# Patient Record
Sex: Male | Born: 1970 | Race: White | Hispanic: No | Marital: Single | Smoking: Never smoker
Health system: Southern US, Community
[De-identification: ages and names within clinical notes are randomized; demographics above are authoritative.]

## PROBLEM LIST (undated history)

## (undated) DIAGNOSIS — Q909 Down syndrome, unspecified: Secondary | ICD-10-CM

## (undated) DIAGNOSIS — Q249 Congenital malformation of heart, unspecified: Secondary | ICD-10-CM

## (undated) DIAGNOSIS — E785 Hyperlipidemia, unspecified: Secondary | ICD-10-CM

## (undated) DIAGNOSIS — H269 Unspecified cataract: Secondary | ICD-10-CM

## (undated) DIAGNOSIS — G40909 Epilepsy, unspecified, not intractable, without status epilepticus: Secondary | ICD-10-CM

## (undated) HISTORY — DX: Hyperlipidemia, unspecified: E78.5

## (undated) HISTORY — DX: Epilepsy, unspecified, not intractable, without status epilepticus: G40.909

## (undated) HISTORY — DX: Down syndrome, unspecified: Q90.9

## (undated) HISTORY — DX: Unspecified cataract: H26.9

---

## 1998-11-26 ENCOUNTER — Encounter: Payer: Self-pay | Admitting: Emergency Medicine

## 1998-11-26 ENCOUNTER — Emergency Department (HOSPITAL_COMMUNITY): Admission: EM | Admit: 1998-11-26 | Discharge: 1998-11-26 | Payer: Self-pay | Admitting: Emergency Medicine

## 2000-03-30 ENCOUNTER — Encounter: Payer: Self-pay | Admitting: Emergency Medicine

## 2000-03-30 ENCOUNTER — Observation Stay (HOSPITAL_COMMUNITY): Admission: EM | Admit: 2000-03-30 | Discharge: 2000-03-31 | Payer: Self-pay | Admitting: Emergency Medicine

## 2001-03-25 ENCOUNTER — Encounter: Payer: Self-pay | Admitting: Emergency Medicine

## 2001-03-25 ENCOUNTER — Emergency Department (HOSPITAL_COMMUNITY): Admission: EM | Admit: 2001-03-25 | Discharge: 2001-03-25 | Payer: Self-pay | Admitting: Emergency Medicine

## 2002-02-20 ENCOUNTER — Emergency Department (HOSPITAL_COMMUNITY): Admission: EM | Admit: 2002-02-20 | Discharge: 2002-02-20 | Payer: Self-pay | Admitting: Emergency Medicine

## 2003-08-18 ENCOUNTER — Emergency Department (HOSPITAL_COMMUNITY): Admission: EM | Admit: 2003-08-18 | Discharge: 2003-08-18 | Payer: Self-pay | Admitting: Emergency Medicine

## 2005-05-18 ENCOUNTER — Ambulatory Visit: Payer: Self-pay | Admitting: Family Medicine

## 2005-06-25 ENCOUNTER — Emergency Department (HOSPITAL_COMMUNITY): Admission: EM | Admit: 2005-06-25 | Discharge: 2005-06-25 | Payer: Self-pay | Admitting: Emergency Medicine

## 2005-06-26 ENCOUNTER — Ambulatory Visit: Payer: Self-pay | Admitting: Family Medicine

## 2005-07-04 ENCOUNTER — Emergency Department (HOSPITAL_COMMUNITY): Admission: EM | Admit: 2005-07-04 | Discharge: 2005-07-04 | Payer: Self-pay | Admitting: Emergency Medicine

## 2005-09-21 ENCOUNTER — Ambulatory Visit: Payer: Self-pay | Admitting: Family Medicine

## 2005-11-14 ENCOUNTER — Ambulatory Visit: Payer: Self-pay | Admitting: Family Medicine

## 2006-08-16 ENCOUNTER — Ambulatory Visit: Payer: Self-pay | Admitting: Family Medicine

## 2006-08-16 ENCOUNTER — Encounter (INDEPENDENT_AMBULATORY_CARE_PROVIDER_SITE_OTHER): Payer: Self-pay | Admitting: Internal Medicine

## 2006-08-16 LAB — CONVERTED CEMR LAB
ALT: 15 units/L (ref 0–40)
AST: 15 units/L (ref 0–37)
Cholesterol: 165 mg/dL (ref 0–200)
HDL: 44.8 mg/dL (ref >39.0)
LDL Cholesterol: 101 mg/dL — ABNORMAL HIGH (ref 0–99)
Phenobarbital: 36.4 ug/mL (ref 15.0–40.0)
Total CHOL/HDL Ratio: 3.7
Triglycerides: 95 mg/dL (ref 0–149)
VLDL: 19 mg/dL (ref 0–40)

## 2007-01-31 ENCOUNTER — Telehealth (INDEPENDENT_AMBULATORY_CARE_PROVIDER_SITE_OTHER): Payer: Self-pay | Admitting: Internal Medicine

## 2007-02-07 ENCOUNTER — Ambulatory Visit: Payer: Self-pay | Admitting: Internal Medicine

## 2007-02-07 DIAGNOSIS — R002 Palpitations: Secondary | ICD-10-CM

## 2007-02-07 DIAGNOSIS — R011 Cardiac murmur, unspecified: Secondary | ICD-10-CM

## 2007-02-07 DIAGNOSIS — Q909 Down syndrome, unspecified: Secondary | ICD-10-CM

## 2007-02-07 DIAGNOSIS — E785 Hyperlipidemia, unspecified: Secondary | ICD-10-CM

## 2007-02-07 DIAGNOSIS — R569 Unspecified convulsions: Secondary | ICD-10-CM

## 2007-02-07 DIAGNOSIS — L702 Acne varioliformis: Secondary | ICD-10-CM

## 2007-02-08 LAB — CONVERTED CEMR LAB
ALT: 22 units/L (ref 0–53)
AST: 16 units/L (ref 0–37)
BUN: 14 mg/dL (ref 6–23)
Basophils Absolute: 0 10*3/uL (ref 0.0–0.1)
Basophils Relative: 0.7 % (ref 0.0–1.0)
CO2: 34 meq/L — ABNORMAL HIGH (ref 19–32)
Calcium: 9.1 mg/dL (ref 8.4–10.5)
Chloride: 102 meq/L (ref 96–112)
Cholesterol: 165 mg/dL (ref 0–200)
Creatinine, Ser: 1.1 mg/dL (ref 0.4–1.5)
Eosinophils Absolute: 0 10*3/uL (ref 0.0–0.6)
Eosinophils Relative: 0.6 % (ref 0.0–5.0)
GFR calc Af Amer: 98 mL/min
GFR calc non Af Amer: 81 mL/min
Glucose, Bld: 98 mg/dL (ref 70–99)
HCT: 45.1 % (ref 39.0–52.0)
HDL: 51.6 mg/dL (ref >39.0)
Hemoglobin: 15.4 g/dL (ref 13.0–17.0)
LDL Cholesterol: 98 mg/dL (ref 0–99)
Lymphocytes Relative: 36.9 % (ref 12.0–46.0)
MCHC: 34.1 g/dL (ref 30.0–36.0)
MCV: 102.4 fL — ABNORMAL HIGH (ref 78.0–100.0)
Monocytes Absolute: 0.5 10*3/uL (ref 0.2–0.7)
Monocytes Relative: 9.4 % (ref 3.0–11.0)
Neutro Abs: 2.5 10*3/uL (ref 1.4–7.7)
Neutrophils Relative %: 52.4 % (ref 43.0–77.0)
Platelets: 266 10*3/uL (ref 150–400)
Potassium: 4.5 meq/L (ref 3.5–5.1)
RBC: 4.41 M/uL (ref 4.22–5.81)
RDW: 14.1 % (ref 11.5–14.6)
Sodium: 140 meq/L (ref 135–145)
TSH: 0.92 microintl units/mL (ref 0.35–5.50)
Total CHOL/HDL Ratio: 3.2
Triglycerides: 75 mg/dL (ref 0–149)
VLDL: 15 mg/dL (ref 0–40)
WBC: 4.8 10*3/uL (ref 4.5–10.5)

## 2007-07-02 ENCOUNTER — Telehealth (INDEPENDENT_AMBULATORY_CARE_PROVIDER_SITE_OTHER): Payer: Self-pay | Admitting: Internal Medicine

## 2007-08-13 ENCOUNTER — Ambulatory Visit: Payer: Self-pay | Admitting: Internal Medicine

## 2007-08-13 ENCOUNTER — Telehealth (INDEPENDENT_AMBULATORY_CARE_PROVIDER_SITE_OTHER): Payer: Self-pay | Admitting: *Deleted

## 2007-08-15 LAB — CONVERTED CEMR LAB
ALT: 19 units/L (ref 0–53)
AST: 15 units/L (ref 0–37)
Cholesterol: 176 mg/dL (ref 0–200)
HDL: 45 mg/dL (ref >39.0)
LDL Cholesterol: 118 mg/dL — ABNORMAL HIGH (ref 0–99)
Total CHOL/HDL Ratio: 3.9
Triglycerides: 65 mg/dL (ref 0–149)
VLDL: 13 mg/dL (ref 0–40)

## 2007-09-02 ENCOUNTER — Telehealth (INDEPENDENT_AMBULATORY_CARE_PROVIDER_SITE_OTHER): Payer: Self-pay | Admitting: Internal Medicine

## 2007-09-11 ENCOUNTER — Encounter (INDEPENDENT_AMBULATORY_CARE_PROVIDER_SITE_OTHER): Payer: Self-pay | Admitting: Internal Medicine

## 2007-09-11 ENCOUNTER — Ambulatory Visit: Payer: Self-pay | Admitting: Family Medicine

## 2007-09-13 ENCOUNTER — Telehealth (INDEPENDENT_AMBULATORY_CARE_PROVIDER_SITE_OTHER): Payer: Self-pay | Admitting: Internal Medicine

## 2007-09-13 LAB — CONVERTED CEMR LAB: Phenobarbital: 32.2 ug/mL (ref 15.0–40.0)

## 2008-04-08 DIAGNOSIS — H6690 Otitis media, unspecified, unspecified ear: Secondary | ICD-10-CM | POA: Insufficient documentation

## 2008-04-14 ENCOUNTER — Encounter (INDEPENDENT_AMBULATORY_CARE_PROVIDER_SITE_OTHER): Payer: Self-pay | Admitting: Internal Medicine

## 2008-04-14 ENCOUNTER — Ambulatory Visit: Payer: Self-pay | Admitting: Family Medicine

## 2008-04-15 ENCOUNTER — Encounter (INDEPENDENT_AMBULATORY_CARE_PROVIDER_SITE_OTHER): Payer: Self-pay | Admitting: Internal Medicine

## 2008-04-15 LAB — CONVERTED CEMR LAB
ALT: 17 units/L (ref 0–53)
AST: 14 units/L (ref 0–37)
Cholesterol: 155 mg/dL (ref 0–200)
HDL: 54.9 mg/dL (ref >39.0)
LDL Cholesterol: 83 mg/dL (ref 0–99)
Phenobarbital: 36 ug/mL (ref 15.0–40.0)
Total CHOL/HDL Ratio: 2.8
Triglycerides: 87 mg/dL (ref 0–149)
VLDL: 17 mg/dL (ref 0–40)

## 2008-10-21 ENCOUNTER — Telehealth (INDEPENDENT_AMBULATORY_CARE_PROVIDER_SITE_OTHER): Payer: Self-pay | Admitting: Internal Medicine

## 2009-04-19 ENCOUNTER — Telehealth: Payer: Self-pay | Admitting: Family Medicine

## 2009-04-28 ENCOUNTER — Ambulatory Visit: Payer: Self-pay | Admitting: Family Medicine

## 2009-04-28 ENCOUNTER — Encounter (INDEPENDENT_AMBULATORY_CARE_PROVIDER_SITE_OTHER): Payer: Self-pay | Admitting: Internal Medicine

## 2009-04-29 LAB — CONVERTED CEMR LAB
ALT: 15 units/L (ref 0–53)
AST: 16 units/L (ref 0–37)
BUN: 12 mg/dL (ref 6–23)
Basophils Absolute: 0 10*3/uL (ref 0.0–0.1)
Basophils Relative: 0.7 % (ref 0.0–3.0)
CO2: 31 meq/L (ref 19–32)
Calcium: 9.1 mg/dL (ref 8.4–10.5)
Chloride: 105 meq/L (ref 96–112)
Cholesterol: 176 mg/dL (ref 0–200)
Creatinine, Ser: 1.1 mg/dL (ref 0.4–1.5)
Eosinophils Absolute: 0 10*3/uL (ref 0.0–0.7)
Eosinophils Relative: 0.7 % (ref 0.0–5.0)
GFR calc non Af Amer: 79.68 mL/min (ref >60)
Glucose, Bld: 99 mg/dL (ref 70–99)
HCT: 44.1 % (ref 39.0–52.0)
HDL: 45.9 mg/dL (ref >39.00)
Hemoglobin: 14.9 g/dL (ref 13.0–17.0)
LDL Cholesterol: 108 mg/dL — ABNORMAL HIGH (ref 0–99)
Lymphocytes Relative: 27.5 % (ref 12.0–46.0)
Lymphs Abs: 1.1 10*3/uL (ref 0.7–4.0)
MCHC: 33.8 g/dL (ref 30.0–36.0)
MCV: 104.2 fL — ABNORMAL HIGH (ref 78.0–100.0)
Monocytes Absolute: 0.5 10*3/uL (ref 0.1–1.0)
Monocytes Relative: 11.7 % (ref 3.0–12.0)
Neutro Abs: 2.3 10*3/uL (ref 1.4–7.7)
Neutrophils Relative %: 59.4 % (ref 43.0–77.0)
Phenobarbital: 31.9 ug/mL (ref 15.0–40.0)
Platelets: 233 10*3/uL (ref 150.0–400.0)
Potassium: 4.9 meq/L (ref 3.5–5.1)
RBC: 4.24 M/uL (ref 4.22–5.81)
RDW: 13.8 % (ref 11.5–14.6)
Sodium: 141 meq/L (ref 135–145)
TSH: 2.24 microintl units/mL (ref 0.35–5.50)
Total CHOL/HDL Ratio: 4
Triglycerides: 113 mg/dL (ref 0.0–149.0)
VLDL: 22.6 mg/dL (ref 0.0–40.0)
WBC: 3.9 10*3/uL — ABNORMAL LOW (ref 4.5–10.5)

## 2009-07-08 ENCOUNTER — Telehealth (INDEPENDENT_AMBULATORY_CARE_PROVIDER_SITE_OTHER): Payer: Self-pay | Admitting: Internal Medicine

## 2009-10-05 ENCOUNTER — Telehealth: Payer: Self-pay | Admitting: Family Medicine

## 2009-11-03 ENCOUNTER — Ambulatory Visit: Payer: Self-pay | Admitting: Family Medicine

## 2009-11-04 LAB — CONVERTED CEMR LAB
Cholesterol: 163 mg/dL (ref 0–200)
HDL: 60 mg/dL (ref >39.00)
LDL Cholesterol: 89 mg/dL (ref 0–99)
Phenobarbital: 31.5 ug/mL (ref 15.0–40.0)
Total CHOL/HDL Ratio: 3
Triglycerides: 72 mg/dL (ref 0.0–149.0)
VLDL: 14.4 mg/dL (ref 0.0–40.0)

## 2010-04-18 ENCOUNTER — Encounter: Payer: Self-pay | Admitting: Family Medicine

## 2010-04-18 ENCOUNTER — Ambulatory Visit: Payer: Self-pay | Admitting: Internal Medicine

## 2010-04-18 DIAGNOSIS — M25559 Pain in unspecified hip: Secondary | ICD-10-CM

## 2010-04-18 LAB — CONVERTED CEMR LAB: Direct LDL: 106.6 mg/dL

## 2010-04-19 ENCOUNTER — Telehealth (INDEPENDENT_AMBULATORY_CARE_PROVIDER_SITE_OTHER): Payer: Self-pay | Admitting: *Deleted

## 2010-04-19 LAB — CONVERTED CEMR LAB: Phenobarbital: 38.8 ug/mL (ref 15.0–40.0)

## 2010-04-20 LAB — CONVERTED CEMR LAB
ALT: 15 units/L (ref 0–53)
AST: 16 units/L (ref 0–37)
Albumin: 3.8 g/dL (ref 3.5–5.2)
Alkaline Phosphatase: 105 units/L (ref 39–117)
BUN: 14 mg/dL (ref 6–23)
Basophils Absolute: 0 10*3/uL (ref 0.0–0.1)
Basophils Relative: 0.9 % (ref 0.0–3.0)
Bilirubin, Direct: 0.1 mg/dL (ref 0.0–0.3)
CO2: 32 meq/L (ref 19–32)
Calcium: 9 mg/dL (ref 8.4–10.5)
Chloride: 102 meq/L (ref 96–112)
Cholesterol: 166 mg/dL (ref 0–200)
Creatinine, Ser: 1.3 mg/dL (ref 0.4–1.5)
Eosinophils Absolute: 0 10*3/uL (ref 0.0–0.7)
Eosinophils Relative: 0.8 % (ref 0.0–5.0)
Folate: 8.4 ng/mL
GFR calc non Af Amer: 65.37 mL/min (ref >60)
Glucose, Bld: 98 mg/dL (ref 70–99)
HCT: 44 % (ref 39.0–52.0)
HDL: 48.5 mg/dL (ref >39.00)
Hemoglobin: 14.9 g/dL (ref 13.0–17.0)
LDL Cholesterol: 97 mg/dL (ref 0–99)
Lymphocytes Relative: 33.8 % (ref 12.0–46.0)
Lymphs Abs: 1.5 10*3/uL (ref 0.7–4.0)
MCHC: 34 g/dL (ref 30.0–36.0)
MCV: 106.2 fL — ABNORMAL HIGH (ref 78.0–100.0)
Monocytes Absolute: 0.5 10*3/uL (ref 0.1–1.0)
Monocytes Relative: 11.2 % (ref 3.0–12.0)
Neutro Abs: 2.4 10*3/uL (ref 1.4–7.7)
Neutrophils Relative %: 53.3 % (ref 43.0–77.0)
Platelets: 242 10*3/uL (ref 150.0–400.0)
Potassium: 4.7 meq/L (ref 3.5–5.1)
RBC: 4.14 M/uL — ABNORMAL LOW (ref 4.22–5.81)
RDW: 14.3 % (ref 11.5–14.6)
Sodium: 142 meq/L (ref 135–145)
TSH: 2.5 microintl units/mL (ref 0.35–5.50)
Total Bilirubin: 0.4 mg/dL (ref 0.3–1.2)
Total CHOL/HDL Ratio: 3
Total CK: 51 units/L (ref 7–232)
Total Protein: 6.8 g/dL (ref 6.0–8.3)
Triglycerides: 102 mg/dL (ref 0.0–149.0)
VLDL: 20.4 mg/dL (ref 0.0–40.0)
Vitamin B-12: 146 pg/mL — ABNORMAL LOW (ref 211–911)
WBC: 4.4 10*3/uL — ABNORMAL LOW (ref 4.5–10.5)

## 2010-04-22 ENCOUNTER — Ambulatory Visit: Payer: Self-pay | Admitting: Internal Medicine

## 2010-04-22 DIAGNOSIS — E538 Deficiency of other specified B group vitamins: Secondary | ICD-10-CM

## 2010-04-29 ENCOUNTER — Ambulatory Visit: Payer: Self-pay | Admitting: Internal Medicine

## 2010-05-04 ENCOUNTER — Encounter: Payer: Self-pay | Admitting: Family Medicine

## 2010-05-06 ENCOUNTER — Ambulatory Visit: Payer: Self-pay | Admitting: Family Medicine

## 2010-05-06 ENCOUNTER — Ambulatory Visit: Payer: Self-pay | Admitting: Internal Medicine

## 2010-05-13 ENCOUNTER — Ambulatory Visit: Payer: Self-pay | Admitting: Family Medicine

## 2010-06-07 ENCOUNTER — Telehealth: Payer: Self-pay | Admitting: Family Medicine

## 2010-08-30 NOTE — Letter (Signed)
Summary: Physical Exam form/Community Support Service  Physical Exam form/Community Support Service   Imported By: Sherian Rein 05/12/2010 08:18:10  _____________________________________________________________________  External Attachment:    Type:   Image     Comment:   External Document

## 2010-08-30 NOTE — Progress Notes (Signed)
Summary: ? B-12  Phone Note Call from Patient Call back at [phone]   Caller: Sister/Kim Call For: Kenneth Boyden  MD Summary of Call: Patient was told to start taking Vitamin B-12. Kim went to the pharmacy to pickup it up  and she needs to know what dose he needs to take. Pharmacist told her that this comes in difference strength.  Initial call taken by: Sydell Axon LPN,  June 07, 2010 4:26 PM  Follow-up for Phone Call        1000 micrograms oral daily.  thanks Follow-up by: Kenneth Boyden  MD,  June 07, 2010 5:00 PM  Additional Follow-up for Phone Call Additional follow up Details #1::        Spoke with patient's brother in law and gave him the correct strength.  Additional Follow-up by: Janee Morn CMA Duncan Dull),  June 08, 2010 8:48 AM

## 2010-08-30 NOTE — Progress Notes (Signed)
----   Converted from flag ---- ---- 04/19/2010 9:52 AM, Mills Koller wrote:   ---- 04/19/2010 8:50 AM, Eustaquio Boyden  MD wrote: can we add TSH, B12 and folate to blood drawn yesterday? ------------------------------

## 2010-08-30 NOTE — Assessment & Plan Note (Signed)
Summary: B-12//kad  Nurse Visit   Allergies: 1)  ! Pcn 2)  Augmentin  Medication Administration  Injection # 1:    Medication: Vit B12 1000 mcg    Diagnosis: VITAMIN B12 DEFICIENCY (ICD-266.2)    Route: IM    Site: L deltoid    Exp Date: 01/28/2012    Lot #: [id number]    Mfr: American Regent    Comments: Per Dr. Sharen Hones    Patient tolerated injection without complications    Given by: Selena Batten Dance CMA Duncan Dull) (May 06, 2010 4:03 PM)  Orders Added: 1)  Admin of Therapeutic Inj  intramuscular or subcutaneous [16109]

## 2010-08-30 NOTE — Assessment & Plan Note (Signed)
Summary: REFILL MEDS/BILLIE'S PT/CLE   Vital Signs:  Patient profile:   40 year old male Weight:      170.75 pounds Temp:     97.9 degrees F oral Pulse rate:   60 / minute Pulse rhythm:   regular BP sitting:   98 / 60  (left arm) Cuff size:   large  Vitals Entered By: Selena Batten Dance CMA Duncan Dull) (April 18, 2010 8:31 AM) CC: Refill meds   History of Present Illness: CC: CPE and med refills.  Here for follow up of Down's, seizure disorder, elevated cholesterol, and yearly physical for DSS --no seizures in past 3 years. 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  Is complaining of hips and knee hurting, esp when gets up or out of car "doesn't work well".  Has fallen twice.  Here with sister who is his "worker"  and guardian.  Current Medications (verified): 1)  Phenobarbital 64.8 Mg  Tabs (Phenobarbital) .... 4 Qhs 2)  Lovastatin 20 Mg Tabs (Lovastatin) .Marland Kitchen.. 1 By Mouth Daily 3)  Gabapentin 100 Mg  Caps (Gabapentin) .... 2 Tabs Two Times A Day 4)  Seroquel 400 Mg Tabs (Quetiapine Fumarate) .... Take One By Mouth Daily  Allergies: 1)  ! Pcn 2)  Augmentin  Past History:  Past Medical History: Down's Syndrome Seizure d/o HLD  Past Surgical History: none  Family History: Parents: DM, HTN,  F: CAD/MI, CVA M: cerebral hemorrhage MGM: oral cancer MAunt: colon cancer (70s)  No other CA  Social History: No smoking, EtOH  Marital Status: single, lives with his sister and her family, 5 dogs, 1 cat, 4 goats, 20 chickens Children: none Occupation: working at [employer]"  Review of Systems  The patient denies anorexia, fever, weight loss, weight gain, vision loss, decreased hearing, hoarseness, chest pain, syncope, dyspnea on exertion, peripheral edema, prolonged cough, headaches, hemoptysis, abdominal pain, melena, hematochezia, severe indigestion/heartburn,  hematuria, and incontinence.    Physical Exam  General:  alert, well-developed, well-nourished, and well-hydrated. Head:  Normocephalic and atraumatic without obvious abnormalities. No apparent alopecia or balding. Eyes:  pupils equal, pupils round, and no injection.  glasses Ears:  R ear normal and L ear normal.   Mouth:  pharynx pink and moist and edentulous.  tongue large for mouth Neck:  no thyromegaly, no JVD, and no carotid bruits.   Lungs:  normal respiratory effort, no intercostal retractions, no accessory muscle use, and normal breath sounds.   Heart:  normal rate, regular rhythm, and no murmur.   Abdomen:  soft, non-tender, normal bowel sounds, no distention, no masses, no guarding, no inguinal hernia, no hepatomegaly, and no splenomegaly.   has ventral hernia, large--visible only with increased intrabdominal pressure Msk:  no joint swelling, no joint warmth, no redness over joints, and no joint deformities.  + mild tenderness to int/ext rotation at bilateral hips, endorses pain with palpation of greater trochanteric bursas B and midline spine. Extremities:  no edema either lower legs Neurologic:  alert & oriented X3, sensation intact to light touch, gait normal Skin:  turgor normal, color normal, and no rashes.  acne scarring back of neck Psych:  normally interactive, flat affect, and subdued.     Impression & Recommendations:  Problem # 1:  SEIZURE DISORDER, GENERALIZED (ICD-780.39) check phenobarb level, CBC, LFTs.  Stable on current regimen. His updated medication list for this problem includes:    Phenobarbital 64.8 Mg Tabs (  Phenobarbital) .Marland KitchenMarland KitchenMarland KitchenMarland Kitchen 4 qhs    Gabapentin 100 Mg Caps (Gabapentin) .Marland Kitchen... 2 tabs two times a day  Orders: TLB-BMP (Basic Metabolic Panel-BMET) (80048-METABOL) TLB-CBC Platelet - w/Differential (85025-CBCD) TLB-Hepatic/Liver Function Pnl (80076-HEPATIC) T-Phenobarbital (84696-29528) Specimen Handling (41324) Prescription Created Electronically  [phone])  Labs Reviewed: Hgb: 14.9 (04/28/2009)   Hct: 44.1 (04/28/2009)   WBC: 3.9 (04/28/2009)   RBC: 4.24 (04/28/2009)   Plts: 233.0 (04/28/2009) SGOT: 16 (04/28/2009)   SGPT: 15 (04/28/2009)    Pheonbarbital: 31.5 (11/03/2009)     Problem # 2:  HYPERCHOLESTEROLEMIA, PURE (ICD-272.0) check dLDL (last checked FLP 10/2009.) His updated medication list for this problem includes:    Lovastatin 20 Mg Tabs (Lovastatin) .Marland Kitchen... 1 by mouth daily  Orders: TLB-Lipid Panel (80061-LIPID)  Labs Reviewed: SGOT: 16 (04/28/2009)   SGPT: 15 (04/28/2009)   HDL:60.00 (11/03/2009), 45.90 (04/28/2009)  LDL:89 (11/03/2009), 108 (72/53/6644)  Chol:163 (11/03/2009), 176 (04/28/2009)  Trig:72.0 (11/03/2009), 113.0 (04/28/2009)  Problem # 3:  PAIN IN JOINT PELVIC REGION AND THIGH (ICD-719.45) ? arthritis vs other.  if continued despite NSAID, consider xray to r/o hip pathology. His updated medication list for this problem includes:    Ec-naprosyn 500 Mg Tbec (Naproxen) .Marland Kitchen... Take one by mouth two times a day x 7 days then as needed.  take with food  Orders: TLB-CK Total Only(Creatine Kinase/CPK) (82550-CK)  Problem # 4:  HEALTH MAINTENANCE EXAM (ICD-V70.0) Reviewed preventive care protocols, scheduled due services, and updated immunizations.  flu shot today.  Problem # 5:  DOWN SYNDROME (ICD-758.0) takes seroquel to help calm (previously was wandering off).  stable  Complete Medication List: 1)  Phenobarbital 64.8 Mg Tabs (Phenobarbital) .... 4 qhs 2)  Lovastatin 20 Mg Tabs (Lovastatin) .Marland Kitchen.. 1 by mouth daily 3)  Gabapentin 100 Mg Caps (Gabapentin) .... 2 tabs two times a day 4)  Seroquel 400 Mg Tabs (Quetiapine fumarate) .... Take one by mouth daily 5)  Ec-naprosyn 500 Mg Tbec (Naproxen) .... Take one by mouth two times a day x 7 days then as needed.  take with food  Other Orders: Flu Vaccine 29yrs + (03474) Admin 1st Vaccine (25956)  Patient Instructions: 1)  Joint pains could be some arthritis.   Try naprosyn 500mg  twice daily for 1 week then as needed. 2)  Pleasure to meet you today, paperwork filled out. 3)  Flu shot today.  blood work today. Prescriptions: PHENOBARBITAL 64.8 MG  TABS (PHENOBARBITAL) 4 qhs  #360 x 6   Entered and Authorized by:   Eustaquio Boyden  MD   Signed by:   Eustaquio Boyden  MD on 04/18/2010   Method used:   Telephoned to ...       [facility]       [address]       [place], [place]  [address]       Ph: [phone]       Fax: [phone]   RxID:   [id number] LOVASTATIN 20 MG TABS (LOVASTATIN) 1 by mouth daily  #90 x 3   Entered and Authorized by:   Eustaquio Boyden  MD   Signed by:   Eustaquio Boyden  MD on 04/18/2010   Method used:   Electronically to        [facility] (retail)       [address]       [place], [place]  [address]  Ph: [phone]       Fax: [phone]   RxID:   [id number] EC-NAPROSYN 500 MG TBEC (NAPROXEN) take one by mouth two times a day x 7 days then as needed.  take with food  #30 x 0   Entered and Authorized by:   Eustaquio Boyden  MD   Signed by:   Eustaquio Boyden  MD on 04/18/2010   Method used:   Electronically to        [facility] (retail)       [address]       [place], [place]  [address]       Ph: [phone]       Fax: [phone]   RxID:   [id number]   Current Allergies (reviewed today): ! PCN AUGMENTIN   Prevention & Chronic Care Immunizations   Influenza vaccine: Fluvax 3+  (04/18/2010)   Influenza vaccine due: 04/01/2011    Tetanus booster: 04/28/2009: Tdap   Tetanus booster due: 04/29/2019    Pneumococcal vaccine: Not documented  Other Screening   Smoking status: never  (02/07/2007)  Lipids   Total Cholesterol: 163  (11/03/2009)   LDL: 89  (11/03/2009)   LDL Direct: Not documented   HDL: 60.00  (11/03/2009)   Triglycerides: 72.0  (11/03/2009)    SGOT  (AST): 16  (04/28/2009)   SGPT (ALT): 15  (04/28/2009)   Alkaline phosphatase: Not documented   Total bilirubin: Not documented  Self-Management Support :    Lipid self-management support: Not documented     Immunizations Administered:  Influenza Vaccine # 1:    Vaccine Type: Fluvax 3+    Site: left deltoid    Mfr: GlaxoSmithKline    Dose: 0.5 ml    Route: IM    Given by: Selena Batten Dance CMA (AAMA)    Exp. Date: 01/28/2011    Lot #: [id number]    VIS given: 02/21/07 version given April 18, 2010.  Flu Vaccine Consent Questions:    Do you have a history of severe allergic reactions to this vaccine? no    Any prior history of allergic reactions to egg and/or gelatin? no    Do you have a sensitivity to the preservative Thimersol? no    Do you have a past history of Guillan-Barre Syndrome? no    Do you currently have an acute febrile illness? no    Have you ever had a severe reaction to latex? no    Vaccine information given and explained to patient? yes    Appended Document: REFILL MEDS/BILLIE'S PT/CLE

## 2010-08-30 NOTE — Assessment & Plan Note (Signed)
Summary: B-12//kad  Nurse Visit   Allergies: 1)  ! Pcn 2)  Augmentin  Medication Administration  Injection # 1:    Medication: Vit B12 1000 mcg    Diagnosis: VITAMIN B12 DEFICIENCY (ICD-266.2)    Route: IM    Site: R deltoid    Exp Date: 10/30/2011    Lot #: [id number]    Mfr: American Regent    Patient tolerated injection without complications    Given by: Mervin Hack CMA Duncan Dull) (April 29, 2010 3:23 PM)  Orders Added: 1)  Vit B12 1000 mcg [J3420] 2)  Admin of Therapeutic Inj  intramuscular or subcutaneous [44010]

## 2010-08-30 NOTE — Assessment & Plan Note (Signed)
Summary: B-12//kad  Nurse Visit   Allergies: 1)  ! Pcn 2)  Augmentin  Medication Administration  Injection # 1:    Medication: Vit B12 1000 mcg    Diagnosis: VITAMIN B12 DEFICIENCY (ICD-266.2)    Route: IM    Site: R deltoid    Exp Date: 12/30/2011    Lot #: [id number]    Mfr: American Regent    Patient tolerated injection without complications    Given by: Linde Gillis CMA Duncan Dull) (May 13, 2010 3:40 PM)  Orders Added: 1)  Vit B12 1000 mcg [J3420] 2)  Admin of Therapeutic Inj  intramuscular or subcutaneous [56213]

## 2010-08-30 NOTE — Progress Notes (Signed)
Summary: Rx Phenobarb  Phone Note Refill Request Call back at [phone] Message from:  [facility] on October 05, 2009 1:50 PM  Refills Requested: Medication #1:  PHENOBARBITAL 64.8 MG  TABS 4 qhs   Last Refilled: 09/06/2009 Received e-scribe refill request   Method Requested: Electronic Initial call taken by: Sydell Axon LPN,  October 05, 2009 1:50 PM    Prescriptions: PHENOBARBITAL 64.8 MG  TABS (PHENOBARBITAL) 4 qhs  #360 x 6   Entered and Authorized by:   Shaune Leeks MD   Signed by:   Shaune Leeks MD on 10/05/2009   Method used:   Telephoned to ...       [facility]       [address]       [place], [place]  [address]       Ph: [phone]       Fax: [phone]   RxID:   [id number]

## 2010-08-30 NOTE — Assessment & Plan Note (Signed)
Summary: B-12//kad  Nurse Visit   Allergies: 1)  ! Pcn 2)  Augmentin  Medication Administration  Injection # 1:    Medication: Vit B12 1000 mcg    Diagnosis: VITAMIN B12 DEFICIENCY (ICD-266.2)    Route: IM    Site: L deltoid    Exp Date: 07/31/2011    Lot #: [id number]    Mfr: American Regent    Comments: Per Dr. Sharen Hones    Patient tolerated injection without complications    Given by: Selena Batten Dance CMA Duncan Dull) (April 22, 2010 3:14 PM)  Orders Added: 1)  Vit B12 1000 mcg [J3420] 2)  Admin of Therapeutic Inj  intramuscular or subcutaneous [96372]  Appended Document: B-12//kad    Clinical Lists Changes  Orders: Added new Service order of Est. Patient Level I (56213) - Signed

## 2010-08-31 ENCOUNTER — Telehealth: Payer: Self-pay | Admitting: Family Medicine

## 2010-09-07 NOTE — Progress Notes (Signed)
Summary: rpt labs  Phone Note Outgoing Call   Summary of Call: can we call pt and set up with rpt labs in next month - CBC, B12 [266.2] Initial call taken by: Eustaquio Boyden  MD,  August 31, 2010 8:28 AM  Follow-up for Phone Call        Message left for sister to return my call and schedule lab. Follow-up by: Janee Morn CMA Duncan Dull),  August 31, 2010 1:03 PM  Additional Follow-up for Phone Call Additional follow up Details #1::        Message left for sister to return my call. Kim Dance CMA (AAMA)  September 02, 2010 1:08 PM   Spoke with sister and labs scheduled.  Additional Follow-up by: Janee Morn CMA Duncan Dull),  September 02, 2010 2:17 PM

## 2010-10-03 ENCOUNTER — Encounter (INDEPENDENT_AMBULATORY_CARE_PROVIDER_SITE_OTHER): Payer: Self-pay | Admitting: *Deleted

## 2010-10-03 ENCOUNTER — Other Ambulatory Visit: Payer: Self-pay | Admitting: Family Medicine

## 2010-10-03 ENCOUNTER — Other Ambulatory Visit (INDEPENDENT_AMBULATORY_CARE_PROVIDER_SITE_OTHER): Payer: Medicare Other

## 2010-10-03 DIAGNOSIS — E538 Deficiency of other specified B group vitamins: Secondary | ICD-10-CM

## 2010-10-03 LAB — CBC WITH DIFFERENTIAL/PLATELET
Basophils Absolute: 0 10*3/uL (ref 0.0–0.1)
Basophils Relative: 0.8 % (ref 0.0–3.0)
Eosinophils Absolute: 0 10*3/uL (ref 0.0–0.7)
Eosinophils Relative: 0.6 % (ref 0.0–5.0)
HCT: 41.7 % (ref 39.0–52.0)
Hemoglobin: 14.4 g/dL (ref 13.0–17.0)
Lymphocytes Relative: 30.1 % (ref 12.0–46.0)
Lymphs Abs: 1.5 10*3/uL (ref 0.7–4.0)
MCHC: 34.5 g/dL (ref 30.0–36.0)
MCV: 104.1 fl — ABNORMAL HIGH (ref 78.0–100.0)
Monocytes Absolute: 0.5 10*3/uL (ref 0.1–1.0)
Monocytes Relative: 10 % (ref 3.0–12.0)
Neutro Abs: 3 10*3/uL (ref 1.4–7.7)
Neutrophils Relative %: 58.5 % (ref 43.0–77.0)
RBC: 4 Mil/uL — ABNORMAL LOW (ref 4.22–5.81)

## 2010-10-11 ENCOUNTER — Encounter: Payer: Self-pay | Admitting: Family Medicine

## 2010-10-11 DIAGNOSIS — G40909 Epilepsy, unspecified, not intractable, without status epilepticus: Secondary | ICD-10-CM | POA: Insufficient documentation

## 2010-10-11 DIAGNOSIS — E785 Hyperlipidemia, unspecified: Secondary | ICD-10-CM

## 2010-10-11 DIAGNOSIS — Q909 Down syndrome, unspecified: Secondary | ICD-10-CM | POA: Insufficient documentation

## 2010-11-09 ENCOUNTER — Other Ambulatory Visit: Payer: Self-pay | Admitting: *Deleted

## 2010-11-09 MED ORDER — PHENOBARBITAL 64.8 MG PO TABS: 259.2000 mg | ORAL_TABLET | Freq: Every day | ORAL | Status: DC

## 2010-11-09 NOTE — Telephone Encounter (Signed)
Refilled.  Notify family.

## 2010-11-25 ENCOUNTER — Emergency Department (HOSPITAL_COMMUNITY)
Admission: EM | Admit: 2010-11-25 | Discharge: 2010-11-25 | Disposition: A | Discharge status: Home / Self Care | Payer: No Typology Code available for payment source | Attending: Emergency Medicine | Admitting: Emergency Medicine

## 2010-11-25 ENCOUNTER — Emergency Department (HOSPITAL_COMMUNITY): Payer: No Typology Code available for payment source

## 2010-11-25 DIAGNOSIS — R110 Nausea: Secondary | ICD-10-CM | POA: Insufficient documentation

## 2010-11-25 DIAGNOSIS — R1032 Left lower quadrant pain: Secondary | ICD-10-CM | POA: Insufficient documentation

## 2010-11-25 DIAGNOSIS — Q909 Down syndrome, unspecified: Secondary | ICD-10-CM | POA: Insufficient documentation

## 2010-11-25 DIAGNOSIS — G40909 Epilepsy, unspecified, not intractable, without status epilepticus: Secondary | ICD-10-CM | POA: Insufficient documentation

## 2010-11-25 DIAGNOSIS — Z79899 Other long term (current) drug therapy: Secondary | ICD-10-CM | POA: Insufficient documentation

## 2010-11-25 DIAGNOSIS — F79 Unspecified intellectual disabilities: Secondary | ICD-10-CM | POA: Insufficient documentation

## 2010-11-25 DIAGNOSIS — E7800 Pure hypercholesterolemia, unspecified: Secondary | ICD-10-CM | POA: Insufficient documentation

## 2010-11-25 LAB — CBC
HCT: 42.7 % (ref 39.0–52.0)
Hemoglobin: 14.9 g/dL (ref 13.0–17.0)
MCH: 34.8 pg — ABNORMAL HIGH (ref 26.0–34.0)
MCHC: 34.9 g/dL (ref 30.0–36.0)
MCV: 99.8 fL (ref 78.0–100.0)
Platelets: 275 10*3/uL (ref 150–400)
RDW: 14 % (ref 11.5–15.5)

## 2010-11-25 LAB — PHENOBARBITAL LEVEL: Phenobarbital: 32 ug/mL (ref 15.0–40.0)

## 2010-11-25 LAB — POCT I-STAT, CHEM 8
BUN: 12 mg/dL (ref 6–23)
Calcium, Ion: 1.15 mmol/L (ref 1.12–1.32)
Chloride: 103 mEq/L (ref 96–112)
Creatinine, Ser: 1.2 mg/dL (ref 0.4–1.5)
Glucose, Bld: 142 mg/dL — ABNORMAL HIGH (ref 70–99)
HCT: 44 % (ref 39.0–52.0)
Hemoglobin: 15 g/dL (ref 13.0–17.0)
Potassium: 4.2 mEq/L (ref 3.5–5.1)
TCO2: 29 mmol/L (ref 0–100)

## 2010-11-25 LAB — DIFFERENTIAL
Basophils Absolute: 0.1 10*3/uL (ref 0.0–0.1)
Basophils Relative: 2 % — ABNORMAL HIGH (ref 0–1)
Eosinophils Absolute: 0 10*3/uL (ref 0.0–0.7)
Lymphocytes Relative: 22 % (ref 12–46)
Lymphs Abs: 1.1 10*3/uL (ref 0.7–4.0)
Monocytes Absolute: 0.3 10*3/uL (ref 0.1–1.0)
Monocytes Relative: 6 % (ref 3–12)
Neutro Abs: 3.6 10*3/uL (ref 1.7–7.7)
Neutrophils Relative %: 70 % (ref 43–77)

## 2010-12-16 NOTE — H&P (Signed)
[facility]  Patient:    Kenneth [name], Kenneth [name]                        MRN: [id number] Adm. Date:  [id number] Attending:  Shelba Flake CC:         Roxy Manns, M.D. [facility], [facility]   History and Physical  CHIEF COMPLAINT:  Seizures.  HISTORY OF PRESENT ILLNESS:  Kenneth [name] is a 40 year old male with Downs syndrome.  He also has a long history of a seizure disorder.  He has not had any seizures in the past 2-1/2 to 3 years. He has previously been followed by Dr. Buzzy Han.   Apparently, the only medication he has been on for seizures has been phenobarbital.  This morning on the way to his work shop, he apparently had a grand mal seizure.  The details are unknown.  When he came to the emergency department, he had a second grand mal seizure witnessed by the ED staff.  Apparently, that seizure lasted for 3 minutes followed by a brief postictal state.  Currently, the patient says, he feels unwell.  It is hard for him to describe how he feels.  He states, that "my whole body hurts."  He states, his arms and legs and abdomen hurt.  He denies any shortness of breath or orthopnea.  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  His only medication is phenobarbital 120 mg p.o. q.h.s.  SOCIAL HISTORY:  Lives with his sister.  He goes to a workshop daily.  I discussed that drug use with him.  Currently, there is no drug use according to him and his sister.  There is a remote history of cocaine use when he used to live with his brother, as well as, alcohol use.  FAMILY HISTORY:  Mother deceased with a cerebral hemorrhage at the age of 38, father deceased of a MI at age 29.  He has two brothers that are healthy and one sister who is healthy.  REVIEW OF SYSTEMS:  Has some chest discomfort.  He says that is  getting better.  There is no shortness of breath, PND, orthopnea associated with it. He denies any other complaints of the review of systems.  PHYSICAL EXAMINATION:  VITALS:  Blood pressure 117/71, pulse 82, respirations 20, temperature 98.8.  GENERAL:  He appears to be a well-developed, well-nourished male in no acute distress.  He has stigmata of Downs syndrome.  HEENT:  Atraumatic.  Eyes: extraocular muscles are intact.  Pupils equally round and reactive to light.  NECK:  Supple without any lymphadenopathy or thyromegaly.  Oropharynx is moist.  There is no jugular venous distention.  CHEST:  Clear to auscultation.  CARDIAC:  S1 and S2 are normal.  I do not hear any significant murmur or gallop.  ABDOMINAL EXAMINATION:  Active bowel sounds _____________ nontender.  There is no hepatosplenomegaly, no masses are palpated.  EXTREMITIES:  There is no clubbing, cyanosis, or edema.  NEUROLOGIC EXAMINATION:  He is alert.  He knows that he is in a hospital and he knows his name and his sisters name.  CT scan of the head demonstrated no acute abnormality.  B-Met is normal. Phenobarbital level, I was told verbally, is 19.  I do not see the official  report.  Rhythm strips in the ED have been normal sinus rhythm.  ASSESSMENT/PLAN:  Recurrence of seizures after a 3-year seizure-free period. I think he has had grand mal seizures in the past and currently.  A CT scan of the head is normal.  I think it is worth pursuing a urine drug screen to make sure this is not the etiology.  I discussion with Dr. ______.  I will increase phenobarbital levels to 180 mg p.o. q.h.s.  Put him on seizure precautions for now and I suspect he will leave the hospital in the next day. DD:  03/30/00 TD:  03/30/00 Job: [id number]

## 2010-12-16 NOTE — Discharge Summary (Signed)
[facility]  Patient:    Kenneth [name], KINNARD                        MRN: [id number] Adm. Date:  [id number] Disc. Date: [id number] Attending:  Judie Petit CC:         Roxy Manns, M.D. [facility], York Cerise, M.D.   Discharge Summary  ADMISSION DIAGNOSES: 1. Grand mal seizure. 2. Downs syndrome. 3. Septal deficit heart disease.  DISCHARGE DIAGNOSES: 1. Grand mal seizure. 2. Downs syndrome. 3. Septal deficit heart disease.  BRIEF HISTORY:  [name] is a 40 year old Downs syndrome individual with a long history of seizure disorder.  He had bee seizure free for approximately three years; he had been followed by Dr. Buzzy Han in [place] prior to Dr. Massie Maroon retirement.  He has been on phenobarbital 120 mg daily.  Apparently on the way to work shop on the morning of admission, he had a grand mal seizure with a second seizure in the emergency room.  He lives with his sister and goes to work shop daily.  There is no history of drug abuse, although remotely he apparently Macha have used cocaine and alcohol.  CT scan of the head revealed no acute abnormality.  BMET was normal.  The phenobarbital level was 19.2 with normal 15 to 40.  Urine drug screen was positive only for barbituates.  HOSPITAL COURSE:  He has remained seizure free during his hospitalization. Additionally, he is afebrile with normal vital signs.  His phenobarbital was increased to 180 mg at bedtime with no recurrence of his seizures.  On the morning of discharge, he appeared slightly postictal and slow to respond, but there were no localizing neurologic signs.  He had a regular rhythm with no significant murmur.  He was discharged home on 180 mg of phenobarbital at bedtime.  It was recommended he see Dr. Milinda Antis in approximately two weeks and have his phenobarbital level repeated.  If there is recurrence of seizures despite this increase in medication, then neurologic  re-consultation would be indicated. DD:  03/31/00 TD:  04/02/00 Job: [id number]

## 2011-03-22 ENCOUNTER — Other Ambulatory Visit: Payer: Self-pay | Admitting: Family Medicine

## 2011-03-22 DIAGNOSIS — R569 Unspecified convulsions: Secondary | ICD-10-CM

## 2011-03-22 DIAGNOSIS — E7800 Pure hypercholesterolemia, unspecified: Secondary | ICD-10-CM

## 2011-03-22 DIAGNOSIS — E538 Deficiency of other specified B group vitamins: Secondary | ICD-10-CM

## 2011-03-22 DIAGNOSIS — Q909 Down syndrome, unspecified: Secondary | ICD-10-CM

## 2011-03-30 ENCOUNTER — Other Ambulatory Visit (INDEPENDENT_AMBULATORY_CARE_PROVIDER_SITE_OTHER): Payer: Medicare Other

## 2011-03-30 DIAGNOSIS — Q909 Down syndrome, unspecified: Secondary | ICD-10-CM

## 2011-03-30 DIAGNOSIS — E7800 Pure hypercholesterolemia, unspecified: Secondary | ICD-10-CM

## 2011-03-30 DIAGNOSIS — E538 Deficiency of other specified B group vitamins: Secondary | ICD-10-CM

## 2011-03-30 DIAGNOSIS — R569 Unspecified convulsions: Secondary | ICD-10-CM

## 2011-03-30 LAB — CBC WITH DIFFERENTIAL/PLATELET
Basophils Relative: 1.2 % (ref 0.0–3.0)
Eosinophils Absolute: 0.1 10*3/uL (ref 0.0–0.7)
Eosinophils Relative: 1.6 % (ref 0.0–5.0)
Hemoglobin: 14.4 g/dL (ref 13.0–17.0)
MCHC: 33.2 g/dL (ref 30.0–36.0)
MCV: 103.9 fl — ABNORMAL HIGH (ref 78.0–100.0)
Monocytes Absolute: 0.4 10*3/uL (ref 0.1–1.0)
Neutro Abs: 2.2 10*3/uL (ref 1.4–7.7)
RBC: 4.17 Mil/uL — ABNORMAL LOW (ref 4.22–5.81)
WBC: 3.9 10*3/uL — ABNORMAL LOW (ref 4.5–10.5)

## 2011-03-30 LAB — COMPREHENSIVE METABOLIC PANEL
Alkaline Phosphatase: 110 U/L (ref 39–117)
CO2: 29 mEq/L (ref 19–32)
Creatinine, Ser: 1 mg/dL (ref 0.4–1.5)
GFR: 87.05 mL/min (ref >60.00)
Glucose, Bld: 95 mg/dL (ref 70–99)
Sodium: 141 mEq/L (ref 135–145)
Total Bilirubin: 0.2 mg/dL — ABNORMAL LOW (ref 0.3–1.2)
Total Protein: 7.1 g/dL (ref 6.0–8.3)

## 2011-03-30 LAB — LIPID PANEL
HDL: 53.5 mg/dL (ref >39.00)
Triglycerides: 96 mg/dL (ref 0.0–149.0)
VLDL: 19.2 mg/dL (ref 0.0–40.0)

## 2011-03-31 LAB — CBC WITH DIFFERENTIAL/PLATELET

## 2011-03-31 LAB — COMPREHENSIVE METABOLIC PANEL
AST: 15 U/L (ref 0–37)
Albumin: 4 g/dL (ref 3.5–5.2)
Alkaline Phosphatase: 116 U/L (ref 39–117)
BUN: 14 mg/dL (ref 6–23)
Glucose, Bld: 86 mg/dL (ref 70–99)
Potassium: 4.4 mEq/L (ref 3.5–5.3)
Sodium: 142 mEq/L (ref 135–145)
Total Bilirubin: 0.3 mg/dL (ref 0.3–1.2)

## 2011-03-31 LAB — LIPID PANEL
LDL Cholesterol: 105 mg/dL — ABNORMAL HIGH (ref 0–99)
VLDL: 22 mg/dL (ref 0–40)

## 2011-03-31 LAB — PHENYTOIN LEVEL, TOTAL: Phenytoin Lvl: <0.5 ug/mL — ABNORMAL LOW (ref 10.0–20.0)

## 2011-03-31 LAB — TSH

## 2011-04-04 ENCOUNTER — Other Ambulatory Visit: Payer: Self-pay | Admitting: *Deleted

## 2011-04-04 MED ORDER — PHENOBARBITAL 64.8 MG PO TABS: 259.2000 mg | ORAL_TABLET | Freq: Every day | ORAL | Status: DC

## 2011-04-04 NOTE — Telephone Encounter (Signed)
Ok to refill 

## 2011-04-04 NOTE — Telephone Encounter (Signed)
Refilled

## 2011-04-05 ENCOUNTER — Other Ambulatory Visit: Payer: Self-pay | Admitting: Family Medicine

## 2011-04-05 MED ORDER — PHENOBARBITAL 64.8 MG PO TABS: 259.2000 mg | ORAL_TABLET | Freq: Every day | ORAL | Status: DC

## 2011-04-05 NOTE — Progress Notes (Signed)
Rx called in as directed.   

## 2011-04-05 NOTE — Progress Notes (Signed)
Please phone in as i'm not sure if electronically went through

## 2011-04-06 ENCOUNTER — Ambulatory Visit (INDEPENDENT_AMBULATORY_CARE_PROVIDER_SITE_OTHER): Payer: Medicare Other | Admitting: Family Medicine

## 2011-04-06 ENCOUNTER — Encounter: Payer: Self-pay | Admitting: Family Medicine

## 2011-04-06 VITALS — BP 130/78 | HR 78 | Temp 97.7°F | Ht 65.25 in | Wt 171.2 lb

## 2011-04-06 DIAGNOSIS — Z23 Encounter for immunization: Secondary | ICD-10-CM

## 2011-04-06 DIAGNOSIS — Q909 Down syndrome, unspecified: Secondary | ICD-10-CM

## 2011-04-06 DIAGNOSIS — E7800 Pure hypercholesterolemia, unspecified: Secondary | ICD-10-CM

## 2011-04-06 DIAGNOSIS — G40909 Epilepsy, unspecified, not intractable, without status epilepticus: Secondary | ICD-10-CM

## 2011-04-06 DIAGNOSIS — E538 Deficiency of other specified B group vitamins: Secondary | ICD-10-CM

## 2011-04-06 MED ORDER — PRAVASTATIN SODIUM 20 MG PO TABS: 20.0000 mg | ORAL_TABLET | Freq: Every day | ORAL | Status: DC

## 2011-04-06 NOTE — Patient Instructions (Addendum)
Good to see you today. I will change lovastatin to another med to see if covered by insurance.  Have pharmacy call us to see what is preferred. Return in 1 year for follow up. Flu shot today.

## 2011-04-06 NOTE — Assessment & Plan Note (Signed)
Rechecked, 700s.  Continue oral supplementation.

## 2011-04-06 NOTE — Assessment & Plan Note (Addendum)
Stable on low dose lovastatin (20mg ), request change in statin for better insurance coverage. Asked pt to have insurance notify us what statin is covered.  Will try pravastatin. Reviewed blood work in detail.
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
## 2011-04-06 NOTE — Assessment & Plan Note (Signed)
Stable.  Filled out forms.
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
## 2011-07-03 ENCOUNTER — Telehealth: Payer: Self-pay | Admitting: *Deleted

## 2011-07-03 NOTE — Telephone Encounter (Signed)
Filled out and placed in Kim's out box

## 2011-07-03 NOTE — Telephone Encounter (Signed)
Sister dropped off form for completion. It was not done at his last visit. It is in your IN box.

## 2011-07-05 NOTE — Telephone Encounter (Signed)
Form not in my out box. Dr. Reece Agar thinks he Vanduyn have put it in his out box and then MR picked it up. Will mail copy to sister when it has been scanned in the system.

## 2011-07-07 NOTE — Telephone Encounter (Signed)
Form in system. Mailed to patient.

## 2011-09-29 ENCOUNTER — Other Ambulatory Visit: Payer: Self-pay | Admitting: *Deleted

## 2011-09-29 MED ORDER — PHENOBARBITAL 64.8 MG PO TABS: 259.2000 mg | ORAL_TABLET | Freq: Every day | ORAL | Status: DC

## 2011-09-29 NOTE — Telephone Encounter (Signed)
plz phone in. 

## 2011-09-29 NOTE — Telephone Encounter (Signed)
Rx called in as directed.   

## 2012-03-29 ENCOUNTER — Other Ambulatory Visit: Payer: Self-pay | Admitting: Family Medicine

## 2012-03-29 NOTE — Telephone Encounter (Signed)
Filled x3 mo. coming up due for physical.

## 2012-04-26 ENCOUNTER — Telehealth: Payer: Self-pay | Admitting: *Deleted

## 2012-04-26 ENCOUNTER — Other Ambulatory Visit: Payer: Self-pay | Admitting: Family Medicine

## 2012-04-26 MED ORDER — PHENOBARBITAL 64.8 MG PO TABS: ORAL_TABLET | ORAL | Status: DC

## 2012-04-26 NOTE — Telephone Encounter (Signed)
Rx called in as directed. Will call next week to schedule CPX.

## 2012-04-26 NOTE — Telephone Encounter (Signed)
Ok to refill? (I tried electronically and it wouldn't let me)

## 2012-04-26 NOTE — Telephone Encounter (Signed)
plz phone in.  Pt needs physical in next few months however.  plz call to schedule.

## 2012-04-30 NOTE — Telephone Encounter (Signed)
Message left for patient's sister to call and schedule CPE.

## 2012-05-01 NOTE — Telephone Encounter (Signed)
Patient's sister called and scheduled cpe.

## 2012-05-02 ENCOUNTER — Telehealth: Payer: Self-pay | Admitting: Radiology

## 2012-05-02 ENCOUNTER — Encounter: Payer: Self-pay | Admitting: Family Medicine

## 2012-05-02 ENCOUNTER — Ambulatory Visit (INDEPENDENT_AMBULATORY_CARE_PROVIDER_SITE_OTHER): Payer: Medicare Other | Admitting: Family Medicine

## 2012-05-02 VITALS — BP 110/70 | HR 84 | Temp 97.7°F | Ht 66.5 in | Wt 174.0 lb

## 2012-05-02 DIAGNOSIS — E785 Hyperlipidemia, unspecified: Secondary | ICD-10-CM

## 2012-05-02 DIAGNOSIS — G40909 Epilepsy, unspecified, not intractable, without status epilepticus: Secondary | ICD-10-CM

## 2012-05-02 DIAGNOSIS — E538 Deficiency of other specified B group vitamins: Secondary | ICD-10-CM

## 2012-05-02 DIAGNOSIS — E875 Hyperkalemia: Secondary | ICD-10-CM

## 2012-05-02 DIAGNOSIS — Z23 Encounter for immunization: Secondary | ICD-10-CM

## 2012-05-02 DIAGNOSIS — Q909 Down syndrome, unspecified: Secondary | ICD-10-CM

## 2012-05-02 LAB — CBC WITH DIFFERENTIAL/PLATELET
Basophils Relative: 0.9 % (ref 0.0–3.0)
Eosinophils Relative: 0.9 % (ref 0.0–5.0)
Hemoglobin: 14.7 g/dL (ref 13.0–17.0)
Lymphocytes Relative: 35.9 % (ref 12.0–46.0)
MCV: 104.3 fl — ABNORMAL HIGH (ref 78.0–100.0)
Neutro Abs: 2 10*3/uL (ref 1.4–7.7)
Neutrophils Relative %: 49.8 % (ref 43.0–77.0)
RBC: 4.32 Mil/uL (ref 4.22–5.81)
WBC: 4.1 10*3/uL — ABNORMAL LOW (ref 4.5–10.5)

## 2012-05-02 LAB — COMPREHENSIVE METABOLIC PANEL
Albumin: 3.7 g/dL (ref 3.5–5.2)
Alkaline Phosphatase: 122 U/L — ABNORMAL HIGH (ref 39–117)
BUN: 12 mg/dL (ref 6–23)
Creatinine, Ser: 1.4 mg/dL (ref 0.4–1.5)
Glucose, Bld: 100 mg/dL — ABNORMAL HIGH (ref 70–99)
Potassium: 6.1 mEq/L (ref 3.5–5.1)

## 2012-05-02 LAB — LIPID PANEL
Cholesterol: 168 mg/dL (ref 0–200)
HDL: 49 mg/dL (ref >39.00)
LDL Cholesterol: 96 mg/dL (ref 0–99)
Triglycerides: 115 mg/dL (ref 0.0–149.0)
VLDL: 23 mg/dL (ref 0.0–40.0)

## 2012-05-02 MED ORDER — SODIUM POLYSTYRENE SULFONATE 15 GM/60ML PO SUSP: 15.0000 g | Freq: Once | ORAL | Status: DC

## 2012-05-02 NOTE — Assessment & Plan Note (Signed)
Chronic, stable.  No obvious concern for developing dementia currently. Filled physical forms.

## 2012-05-02 NOTE — Telephone Encounter (Signed)
plz call sister - I recommend taking kayexalate x 2 doses, and return early next week for recheck. Also recommend increased water intake over weekend.  Wt Readings from Last 3 Encounters:  05/02/12 174 lb (78.926 kg)  04/06/11 171 lb 4 oz (77.678 kg)  04/18/10 170 lb 12 oz (77.452 kg)

## 2012-05-02 NOTE — Assessment & Plan Note (Signed)
Chronic, stable. Check phenobarb level today. No seizures in last year.

## 2012-05-02 NOTE — Progress Notes (Signed)
Subjective:    Patient ID: Kenneth [name], male    DOB: 31-Jan-1971, 41 y.o.   MRN: [id number]  HPI CC: annual exam today  Here for follow up of Down's, seizure disorder, elevated cholesterol, and yearly physical for DSS   On pravastatin for cholesterol.  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  seroquel and gabapentin refilled by Dr. Simon Rhein with [facility]. Also sees Psychiatric nurse.  Caregiver requests flu shot today.  Overall doing well.  Not more sleepy than normal.  Sometimes says he forgets things, but Dr. Joylene Igo keeps eye on this.  No changes in meds recently.  A few months back had gi illness that resolved within 2 days.  Monday has eye doctor appt who is monitoring cataracts.  Medications and allergies reviewed and updated in chart.  Past histories reviewed and updated if relevant as below. Patient Active Problem List  Diagnosis  . VITAMIN B12 DEFICIENCY  . HLD (hyperlipidemia)  . ACNE VARIOLIFORMIS  . PALPITATIONS  . HEART MURMUR  . Down syndrome  . Seizure disorder   Past Medical History  Diagnosis Date  . Down syndrome   . Seizure disorder   . HLD (hyperlipidemia)    No past surgical history on file. History  Substance Use Topics  . Smoking status: Never Smoker   . Smokeless tobacco: Not on file  . Alcohol Use: No   Family History  Problem Relation Age of Onset  . Diabetes Mother   . Diabetes Father   . Hypertension Mother     Cerebral hemorrhage  . Hypertension Father   . Coronary artery disease Father   . Heart attack Father   . Stroke Father   . Cancer Maternal Grandmother     Oral  . Cancer Maternal Aunt     Colon   Allergies  Allergen Reactions  . Amoxicillin-Pot Clavulanate     unspecified  . Penicillins    Current Outpatient Prescriptions on File Prior to Visit  Medication Sig Dispense Refill  . gabapentin (NEURONTIN) 100 MG capsule Take 200 mg by  mouth 2 (two) times daily.        Marland Kitchen PHENobarbital (LUMINAL) 64.8 MG tablet Take 4 tablets by mouth every night at bedtime.  360 tablet  3  . pravastatin (PRAVACHOL) 20 MG tablet TAKE ONE (1) TABLET BY MOUTH EVERY      DAY  30 tablet  3  . QUEtiapine (SEROQUEL) 400 MG tablet Take 400 mg by mouth daily.        . vitamin B-12 (CYANOCOBALAMIN) 1000 MCG tablet Take 1,000 mcg by mouth daily.            Review of Systems  Constitutional: Negative for fever, chills, activity change, appetite change, fatigue and unexpected weight change.  HENT: Negative for hearing loss and neck pain.   Eyes: Negative for visual disturbance.  Respiratory: Negative for cough, chest tightness, shortness of breath and wheezing.   Cardiovascular: Negative for chest pain, palpitations and leg swelling.  Gastrointestinal: Negative for nausea, vomiting, abdominal pain, diarrhea, constipation, blood in stool and abdominal distention.  Genitourinary: Negative for hematuria and difficulty urinating.  Musculoskeletal: Negative for myalgias and arthralgias.  Skin: Negative for rash.  Neurological: Negative for dizziness, seizures, syncope and headaches.  Hematological: Does not bruise/bleed easily.  Psychiatric/Behavioral: Negative for behavioral problems.       Objective:   Physical Exam  Nursing note and vitals reviewed.  Constitutional: He is oriented to person, place, and time. He appears well-developed and well-nourished. No distress.       Pleasant, cooperative with exam.  HENT:  Head: Normocephalic and atraumatic.  Right Ear: Hearing, tympanic membrane, external ear and ear canal normal.  Left Ear: Hearing, tympanic membrane, external ear and ear canal normal.  Nose: Nose normal.  Mouth/Throat: Oropharynx is clear and moist. No oropharyngeal exudate.       Cerumen bilaterally R>L  Eyes: Conjunctivae normal and EOM are normal. Pupils are equal, round, and reactive to light. No scleral icterus.  Neck: Normal  range of motion. Neck supple. No thyromegaly present.  Cardiovascular: Normal rate, regular rhythm, normal heart sounds and intact distal pulses.   No murmur heard. Pulses:      Radial pulses are 2+ on the right side, and 2+ on the left side.  Pulmonary/Chest: Effort normal and breath sounds normal. No respiratory distress. He has no wheezes. He has no rales.  Abdominal: Soft. Bowel sounds are normal. He exhibits no distension and no mass. There is no tenderness. There is no rebound and no guarding.  Musculoskeletal: Normal range of motion. He exhibits no edema.  Lymphadenopathy:    He has no cervical adenopathy.  Neurological: He is alert and oriented to person, place, and time.       CN grossly intact, station and gait intact  Skin: Skin is warm and dry. No rash noted.  Psychiatric: He has a normal mood and affect. His behavior is normal. Judgment normal.       Assessment & Plan:

## 2012-05-02 NOTE — Telephone Encounter (Signed)
Patient's sister notified and will have patient start kayexalate and increase water. She will schedule labs for next week.

## 2012-05-02 NOTE — Telephone Encounter (Signed)
[facility] called a critical K+ - 6.1. Results given to Dr Sharen Hones

## 2012-05-02 NOTE — Patient Instructions (Addendum)
Flu shot today. Blood work today. Good to se you today, call us with questions. Return in 1 year for next physical.

## 2012-05-02 NOTE — Assessment & Plan Note (Signed)
Check today 

## 2012-05-02 NOTE — Assessment & Plan Note (Signed)
Check FLP today. Complaint and tolerant of pravastatin.

## 2012-05-03 ENCOUNTER — Other Ambulatory Visit: Payer: Self-pay | Admitting: Family Medicine

## 2012-05-03 DIAGNOSIS — G40909 Epilepsy, unspecified, not intractable, without status epilepticus: Secondary | ICD-10-CM

## 2012-05-03 LAB — PHENOBARBITAL LEVEL: Phenobarbital: 43.7 ug/mL — ABNORMAL HIGH (ref 15.0–40.0)

## 2012-05-06 ENCOUNTER — Other Ambulatory Visit (INDEPENDENT_AMBULATORY_CARE_PROVIDER_SITE_OTHER): Payer: Medicare Other

## 2012-05-06 DIAGNOSIS — E875 Hyperkalemia: Secondary | ICD-10-CM

## 2012-05-06 DIAGNOSIS — G40909 Epilepsy, unspecified, not intractable, without status epilepticus: Secondary | ICD-10-CM

## 2012-05-06 LAB — BASIC METABOLIC PANEL
BUN: 13 mg/dL (ref 6–23)
Calcium: 8.9 mg/dL (ref 8.4–10.5)
Creatinine, Ser: 1.2 mg/dL (ref 0.4–1.5)
GFR: 68.96 mL/min (ref >60.00)
Potassium: 3.8 mEq/L (ref 3.5–5.1)

## 2012-05-07 LAB — PHENOBARBITAL LEVEL: Phenobarbital: 38.3 ug/mL (ref 15.0–40.0)

## 2012-07-25 ENCOUNTER — Other Ambulatory Visit: Payer: Self-pay | Admitting: Family Medicine

## 2012-10-20 ENCOUNTER — Emergency Department (HOSPITAL_COMMUNITY): Payer: Medicare Other

## 2012-10-20 ENCOUNTER — Encounter (HOSPITAL_COMMUNITY): Payer: Self-pay | Admitting: *Deleted

## 2012-10-20 ENCOUNTER — Other Ambulatory Visit: Payer: Self-pay

## 2012-10-20 ENCOUNTER — Observation Stay (HOSPITAL_COMMUNITY)
Admission: EM | Admit: 2012-10-20 | Discharge: 2012-10-21 | Disposition: A | Discharge status: Home / Self Care | Payer: Medicare Other | Attending: Internal Medicine | Admitting: Internal Medicine

## 2012-10-20 DIAGNOSIS — R079 Chest pain, unspecified: Principal | ICD-10-CM

## 2012-10-20 DIAGNOSIS — E785 Hyperlipidemia, unspecified: Secondary | ICD-10-CM

## 2012-10-20 DIAGNOSIS — F79 Unspecified intellectual disabilities: Secondary | ICD-10-CM | POA: Insufficient documentation

## 2012-10-20 DIAGNOSIS — Q909 Down syndrome, unspecified: Secondary | ICD-10-CM

## 2012-10-20 DIAGNOSIS — G40909 Epilepsy, unspecified, not intractable, without status epilepticus: Secondary | ICD-10-CM | POA: Diagnosis present

## 2012-10-20 DIAGNOSIS — R0602 Shortness of breath: Secondary | ICD-10-CM | POA: Insufficient documentation

## 2012-10-20 DIAGNOSIS — H269 Unspecified cataract: Secondary | ICD-10-CM | POA: Insufficient documentation

## 2012-10-20 LAB — POCT I-STAT TROPONIN I: Troponin i, poc: 0.01 ng/mL (ref 0.00–0.08)

## 2012-10-20 LAB — CBC WITH DIFFERENTIAL/PLATELET
Eosinophils Absolute: 0 10*3/uL (ref 0.0–0.7)
Hemoglobin: 13.6 g/dL (ref 13.0–17.0)
Lymphs Abs: 1.6 10*3/uL (ref 0.7–4.0)
MCH: 34.9 pg — ABNORMAL HIGH (ref 26.0–34.0)
MCV: 99.5 fL (ref 78.0–100.0)
Monocytes Relative: 10 % (ref 3–12)
Neutrophils Relative %: 54 % (ref 43–77)
RBC: 3.9 MIL/uL — ABNORMAL LOW (ref 4.22–5.81)

## 2012-10-20 LAB — MRSA PCR SCREENING: MRSA by PCR: NEGATIVE

## 2012-10-20 LAB — BASIC METABOLIC PANEL
BUN: 14 mg/dL (ref 6–23)
CO2: 27 mEq/L (ref 19–32)
Chloride: 105 mEq/L (ref 96–112)
Creatinine, Ser: 1.02 mg/dL (ref 0.50–1.35)
Glucose, Bld: 100 mg/dL — ABNORMAL HIGH (ref 70–99)

## 2012-10-20 LAB — CBC
HCT: 41.5 % (ref 39.0–52.0)
Hemoglobin: 14.4 g/dL (ref 13.0–17.0)
MCV: 100 fL (ref 78.0–100.0)
RBC: 4.15 MIL/uL — ABNORMAL LOW (ref 4.22–5.81)
WBC: 4.4 10*3/uL (ref 4.0–10.5)

## 2012-10-20 LAB — GLUCOSE, CAPILLARY: Glucose-Capillary: 100 mg/dL — ABNORMAL HIGH (ref 70–99)

## 2012-10-20 MED ORDER — ONDANSETRON HCL 4 MG/2ML IJ SOLN: 4.0000 mg | Freq: Four times a day (QID) | INTRAMUSCULAR | Status: DC | PRN

## 2012-10-20 MED ORDER — QUETIAPINE FUMARATE 400 MG PO TABS
400.0000 mg | ORAL_TABLET | Freq: Every day | ORAL | Status: DC
  Administered 2012-10-20: 400 mg via ORAL
  Filled 2012-10-20 (×3): qty 1

## 2012-10-20 MED ORDER — SODIUM CHLORIDE 0.9 % IJ SOLN: 3.0000 mL | INTRAMUSCULAR | Status: DC | PRN

## 2012-10-20 MED ORDER — PHENOBARBITAL 32.4 MG PO TABS
64.8000 mg | ORAL_TABLET | Freq: Every day | ORAL | Status: DC
  Administered 2012-10-20: 64.8 mg via ORAL
  Filled 2012-10-20: qty 2

## 2012-10-20 MED ORDER — ASPIRIN 81 MG PO CHEW
324.0000 mg | CHEWABLE_TABLET | Freq: Once | ORAL | Status: AC
  Administered 2012-10-20: 324 mg via ORAL
  Filled 2012-10-20: qty 4

## 2012-10-20 MED ORDER — SIMVASTATIN 10 MG PO TABS
10.0000 mg | ORAL_TABLET | Freq: Every day | ORAL | Status: DC
  Administered 2012-10-20: 10 mg via ORAL
  Filled 2012-10-20 (×2): qty 1

## 2012-10-20 MED ORDER — SODIUM CHLORIDE 0.9 % IJ SOLN
3.0000 mL | Freq: Two times a day (BID) | INTRAMUSCULAR | Status: DC
  Administered 2012-10-20 – 2012-10-21 (×2): 3 mL via INTRAVENOUS

## 2012-10-20 MED ORDER — ONDANSETRON HCL 4 MG PO TABS: 4.0000 mg | ORAL_TABLET | Freq: Four times a day (QID) | ORAL | Status: DC | PRN

## 2012-10-20 MED ORDER — ENOXAPARIN SODIUM 40 MG/0.4ML ~~LOC~~ SOLN
40.0000 mg | SUBCUTANEOUS | Status: DC
  Administered 2012-10-20: 40 mg via SUBCUTANEOUS
  Filled 2012-10-20 (×2): qty 0.4

## 2012-10-20 MED ORDER — SODIUM CHLORIDE 0.9 % IV SOLN: 250.0000 mL | INTRAVENOUS | Status: DC | PRN

## 2012-10-20 MED ORDER — ONDANSETRON HCL 4 MG/2ML IJ SOLN
4.0000 mg | Freq: Once | INTRAMUSCULAR | Status: AC
  Administered 2012-10-20: 4 mg via INTRAVENOUS

## 2012-10-20 MED ORDER — MORPHINE SULFATE 2 MG/ML IJ SOLN
2.0000 mg | INTRAMUSCULAR | Status: DC | PRN
  Administered 2012-10-20: 2 mg via INTRAVENOUS
  Filled 2012-10-20: qty 1

## 2012-10-20 MED ORDER — IOHEXOL 350 MG/ML SOLN: 80.0000 mL | Freq: Once | INTRAVENOUS | Status: DC | PRN

## 2012-10-20 MED ORDER — ASPIRIN EC 81 MG PO TBEC
81.0000 mg | DELAYED_RELEASE_TABLET | Freq: Every day | ORAL | Status: DC
  Administered 2012-10-21: 81 mg via ORAL
  Filled 2012-10-20: qty 1

## 2012-10-20 MED ORDER — MORPHINE SULFATE 4 MG/ML IJ SOLN
4.0000 mg | Freq: Once | INTRAMUSCULAR | Status: AC
  Administered 2012-10-20: 4 mg via INTRAVENOUS
  Filled 2012-10-20: qty 1

## 2012-10-20 MED ORDER — PANTOPRAZOLE SODIUM 40 MG IV SOLR
40.0000 mg | INTRAVENOUS | Status: DC
  Administered 2012-10-20: 40 mg via INTRAVENOUS
  Filled 2012-10-20 (×2): qty 40

## 2012-10-20 MED ORDER — GABAPENTIN 100 MG PO CAPS
200.0000 mg | ORAL_CAPSULE | Freq: Two times a day (BID) | ORAL | Status: DC
  Administered 2012-10-20 – 2012-10-21 (×2): 200 mg via ORAL
  Filled 2012-10-20 (×4): qty 2

## 2012-10-20 MED ORDER — VITAMIN B-12 1000 MCG PO TABS
1000.0000 ug | ORAL_TABLET | Freq: Every day | ORAL | Status: DC
  Administered 2012-10-21: 1000 ug via ORAL
  Filled 2012-10-20: qty 1

## 2012-10-20 MED ORDER — ONDANSETRON HCL 4 MG/2ML IJ SOLN
INTRAMUSCULAR | Status: AC
  Filled 2012-10-20: qty 2

## 2012-10-20 MED ORDER — MORPHINE SULFATE 4 MG/ML IJ SOLN: 4.0000 mg | Freq: Once | INTRAMUSCULAR | Status: DC

## 2012-10-20 NOTE — H&P (Signed)
PCP:   Eustaquio Boyden, MD   Chief Complaint:  Chest pain  HPI: 42 y/o male with h/o Down syndrome who lives with his sister was brought to the hospital with one day history of chest pain. Chest pain started this morning around 9:20 am while watching television. pain is left parasternal.He denies nausea, vomiting, but was retching while I was asking him questions. His sister says that he starts this when either a nurse or physician enters the room. He denies fever, but had increased frequency of urination. CT angio was done in the ED which is negative for aortic dissection or pulmonary embolism  Allergies:   Allergies  Allergen Reactions  . Amoxicillin-Pot Clavulanate     unspecified  . Penicillins       Past Medical History  Diagnosis Date  . Down syndrome   . Seizure disorder   . HLD (hyperlipidemia)   . Cataracts, bilateral     History reviewed. No pertinent past surgical history.  Prior to Admission medications   Medication Sig Start Date End Date Taking? Authorizing Provider  gabapentin (NEURONTIN) 100 MG capsule Take 200 mg by mouth 2 (two) times daily.     Yes Historical Provider, MD  PHENobarbital (LUMINAL) 64.8 MG tablet Take 4 tablets by mouth every night at bedtime. 04/26/12  Yes Eustaquio Boyden, MD  pravastatin (PRAVACHOL) 20 MG tablet Take 20 mg by mouth daily.   Yes Historical Provider, MD  QUEtiapine (SEROQUEL) 400 MG tablet Take 400 mg by mouth daily.     Yes Historical Provider, MD  vitamin B-12 (CYANOCOBALAMIN) 1000 MCG tablet Take 1,000 mcg by mouth daily.     Yes Historical Provider, MD    Social History:  reports that he has never smoked. He does not have any smokeless tobacco history on file. He reports that he does not drink alcohol or use illicit drugs.  Family History  Problem Relation Age of Onset  . Diabetes Mother   . Diabetes Father   . Hypertension Mother     Cerebral hemorrhage  . Hypertension Father   . Coronary artery disease Father    . Heart attack Father   . Stroke Father   . Cancer Maternal Grandmother     Oral  . Cancer Maternal Aunt     Colon    Review of Systems:  HEENT: Denies headache, blurred vision, runny nose, sore throat,  Neck: Denies thyroid problems,lymphadenopathy Chest : Denies shortness of breath, no history of COPD Heart : see HPI GI: Denies  nausea, vomiting, diarrhea, constipation GU: Denies dysuria, urgency, frequency of urination, hematuria Neuro: Denies stroke, seizures, syncope Psych: H/o down syndrome   Physical Exam: Blood pressure 105/65, pulse 66, temperature 98 F (36.7 C), temperature source Oral, resp. rate 10, SpO2 96.00%. Constitutional:   Patient retching  But no vomiting Head: Normocephalic and atraumatic Mouth: Mucus membranes moist Eyes: PERRL, EOMI, conjunctivae normal Neck: Supple, No Thyromegaly Cardiovascular: RRR, S1 normal, S2 normal Pulmonary/Chest: CTAB, no wheezes, rales, or rhonchi Abdominal: Soft. Non-tender, non-distended, bowel sounds are normal, no masses, organomegaly, or guarding present.  Neurological: A&O x3, Strenght is normal and symmetric bilaterally, cranial nerve II-XII are grossly intact, no focal motor deficit, sensory intact to light touch bilaterally.  Extremities : No Cyanosis, Clubbing or Edema   Labs on Admission:  Results for orders placed during the hospital encounter of 10/20/12 (from the past 48 hour(s))  CBC     Status: Abnormal   Collection Time    10/20/12  10:45 AM      Result Value Range   WBC 4.4  4.0 - 10.5 K/uL   RBC 4.15 (*) 4.22 - 5.81 MIL/uL   Hemoglobin 14.4  13.0 - 17.0 g/dL   HCT 16.1  09.6 - 04.5 %   MCV 100.0  78.0 - 100.0 fL   MCH 34.7 (*) 26.0 - 34.0 pg   MCHC 34.7  30.0 - 36.0 g/dL   RDW 40.9  81.1 - 91.4 %   Platelets 230  150 - 400 K/uL  BASIC METABOLIC PANEL     Status: Abnormal   Collection Time    10/20/12 10:45 AM      Result Value Range   Sodium 142  135 - 145 mEq/L   Potassium 4.0  3.5 - 5.1  mEq/L   Chloride 105  96 - 112 mEq/L   CO2 27  19 - 32 mEq/L   Glucose, Bld 100 (*) 70 - 99 mg/dL   BUN 14  6 - 23 mg/dL   Creatinine, Ser 7.82  0.50 - 1.35 mg/dL   Calcium 8.9  8.4 - 95.6 mg/dL   GFR calc non Af Amer 90 (*) >90 mL/min   GFR calc Af Amer >90  >90 mL/min   Comment:            The eGFR has been calculated     using the CKD EPI equation.     This calculation has not been     validated in all clinical     situations.     eGFR's persistently     <90 mL/min signify     possible Chronic Kidney Disease.  POCT I-STAT TROPONIN I     Status: None   Collection Time    10/20/12 11:17 AM      Result Value Range   Troponin i, poc 0.01  0.00 - 0.08 ng/mL   Comment 3            Comment: Due to the release kinetics of cTnI,     a negative result within the first hours     of the onset of symptoms does not rule out     myocardial infarction with certainty.     If myocardial infarction is still suspected,     repeat the test at appropriate intervals.  CBC WITH DIFFERENTIAL     Status: Abnormal   Collection Time    10/20/12  1:44 PM      Result Value Range   WBC 4.5  4.0 - 10.5 K/uL   RBC 3.90 (*) 4.22 - 5.81 MIL/uL   Hemoglobin 13.6  13.0 - 17.0 g/dL   HCT 21.3 (*) 08.6 - 57.8 %   MCV 99.5  78.0 - 100.0 fL   MCH 34.9 (*) 26.0 - 34.0 pg   MCHC 35.1  30.0 - 36.0 g/dL   RDW 46.9  62.9 - 52.8 %   Platelets 206  150 - 400 K/uL   Neutrophils Relative 54  43 - 77 %   Neutro Abs 2.4  1.7 - 7.7 K/uL   Lymphocytes Relative 35  12 - 46 %   Lymphs Abs 1.6  0.7 - 4.0 K/uL   Monocytes Relative 10  3 - 12 %   Monocytes Absolute 0.4  0.1 - 1.0 K/uL   Eosinophils Relative 0  0 - 5 %   Eosinophils Absolute 0.0  0.0 - 0.7 K/uL   Basophils Relative 2 (*) 0 - 1 %  Basophils Absolute 0.1  0.0 - 0.1 K/uL    Radiological Exams on Admission: Dg Chest 2 View  10/20/2012  *RADIOLOGY REPORT*  Clinical Data: Chest pain, shortness of breath  CHEST - 2 VIEW  Comparison: 06/25/2005  Findings:  Cardiomediastinal silhouette is unremarkable. No pulmonary edema.  Mild elevation of the left hemidiaphragm.  There is streaky left basilar retrocardiac atelectasis or infiltrate best seen on lateral view.  IMPRESSION: No pulmonary edema.  Streaky left base retrocardiac atelectasis or infiltrate best seen on lateral view.   Original Report Authenticated By: Natasha Mead, M.D.    Ct Angio Chest Aorta W/cm &/or Wo/cm  10/20/2012  *RADIOLOGY REPORT*  Clinical Data: Chest pain, concern for aortic dissection or pulmonary embolism.  Acute onset anterior chest pain on the left  CT ANGIOGRAPHY CHEST  Technique:  Multidetector CT imaging of the chest using the standard protocol during bolus administration of intravenous contrast. Multiplanar reconstructed images including MIPs were obtained and reviewed to evaluate the vascular anatomy.  Contrast:  80 ml Omnipaque  Comparison: Chest radiograph 10/20/2012.  Findings: Noncontrast images demonstrate no intramural hematoma. Contrast enhanced images demonstrate no contour abnormality of the aorta to suggest aneurysm or dissection.  There is no mediastinal hematoma.  No pericardial fluid.  There is variant anatomy with carotid bifurcation at the level aorta.  While not optimized to evaluate the pulmonary arteries, and in fact there is poor opacification of pulmonary arteries, there is no gross evidence of pulmonary embolism.  Review of the lung parenchyma demonstrates diffuse ground-glass opacities no focal consolidation.  Limited view of the upper abdomen is unremarkable.  Limited view of the skeleton is unremarkable.  The esophagus is gas filled.  No hiatal hernia evident.  IMPRESSION:  1.  No evidence of aortic dissection or aneurysm. 2.  No gross evidence of pulmonary embolism on limited exam. 3.  Diffuse ground-glass opacities Mcaleer suggest mild pulmonary edema. 4.  Gas filled esophagus is nonspecific.   Original Report Authenticated By: Genevive Bi, M.D.      Assessment/Plan  Chest pain ? Mild pulmonary edema Seizure disorder Hyperlipidemia  Chest pain Will admit and monitor three sets of cardiac enzymes Morphine prn for pain. EKG shows no significant abnormality  ? GERD Start protonix 40 mg iv daily zofran prn for pain  ? Mild pulmonary edema CT angio shows ? Pulmonary edema, will obtain 2d echocardiogram  Seizure disorder Continue Phenobarbital and gabapentin  Hyperlipidemia Continue pravachol  DVT prophylaxis lovenox  Time Spent on Admission: 75 min  LAMA,GAGAN S [facility] Pager: [phone] 10/20/2012, 2:57 PM

## 2012-10-20 NOTE — ED Provider Notes (Addendum)
History     CSN: [id number]  Arrival date & time 10/20/12  1038   First MD Initiated Contact with Patient 10/20/12 1138      Chief Complaint  Patient presents with  . Chest Pain   Level V caveat patient mentally retarded. History obtained from patient's sister and from patient (Consider location/radiation/quality/duration/timing/severity/associated sxs/prior treatment) HPI Complains of anterior left sided parasternal chest pain onset 9:20 AM today while watching television. No treatment prior to coming here pain is constant, nonradiating. Nothing makes symptoms better or worse. Patient has not vomited since the event he did not appear sweaty to his sister. He denies shortness of breath  Past Medical History  Diagnosis Date  . Down syndrome   . Seizure disorder   . HLD (hyperlipidemia)   . Cataracts, bilateral    history of present illness continued :no cough no fever  History reviewed. No pertinent past surgical history.  Family History  Problem Relation Age of Onset  . Diabetes Mother   . Diabetes Father   . Hypertension Mother     Cerebral hemorrhage  . Hypertension Father   . Coronary artery disease Father   . Heart attack Father   . Stroke Father   . Cancer Maternal Grandmother     Oral  . Cancer Maternal Aunt     Colon    History  Substance Use Topics  . Smoking status: Never Smoker   . Smokeless tobacco: Not on file  . Alcohol Use: No      Review of Systems  Unable to perform ROS: Dementia  Cardiovascular: Positive for chest pain.    Allergies  Amoxicillin-pot clavulanate and Penicillins  Home Medications   Current Outpatient Rx  Name  Route  Sig  Dispense  Refill  . gabapentin (NEURONTIN) 100 MG capsule   Oral   Take 200 mg by mouth 2 (two) times daily.           Marland Kitchen PHENobarbital (LUMINAL) 64.8 MG tablet      Take 4 tablets by mouth every night at bedtime.   360 tablet   3   . pravastatin (PRAVACHOL) 20 MG tablet      TAKE ONE (1)  TABLET BY MOUTH EVERY      DAY   30 tablet   6   . QUEtiapine (SEROQUEL) 400 MG tablet   Oral   Take 400 mg by mouth daily.           . sodium polystyrene (KAYEXALATE) 15 GM/60ML suspension   Oral   Take 60 mLs (15 g total) by mouth once. Rpt next day x1.   120 mL   0   . vitamin B-12 (CYANOCOBALAMIN) 1000 MCG tablet   Oral   Take 1,000 mcg by mouth daily.             BP 124/75  Pulse 71  Temp(Src) 98 F (36.7 C) (Oral)  Resp 20  SpO2 96%  Physical Exam  Nursing note and vitals reviewed. Constitutional: He appears distressed.  Appears uncomfortable  HENT:  Head: Normocephalic and atraumatic.  Eyes: Conjunctivae are normal. Pupils are equal, round, and reactive to light.  Neck: Neck supple. No tracheal deviation present. No thyromegaly present.  Cardiovascular: Normal rate and regular rhythm.   No murmur heard. Pulmonary/Chest: Effort normal and breath sounds normal.  Abdominal: Soft. Bowel sounds are normal. He exhibits no distension. There is no tenderness.  Musculoskeletal: Normal range of motion. He exhibits no edema and no  tenderness.  Neurological: He is alert. Coordination normal.  Skin: Skin is warm and dry. No rash noted.  Psychiatric: He has a normal mood and affect.    ED Course  Procedures (including critical care time)  Labs Reviewed  CBC - Abnormal; Notable for the following:    RBC 4.15 (*)    MCH 34.7 (*)    All other components within normal limits  BASIC METABOLIC PANEL  CBC WITH DIFFERENTIAL  POCT I-STAT TROPONIN I   Dg Chest 2 View  10/20/2012  *RADIOLOGY REPORT*  Clinical Data: Chest pain, shortness of breath  CHEST - 2 VIEW  Comparison: 06/25/2005  Findings: Cardiomediastinal silhouette is unremarkable. No pulmonary edema.  Mild elevation of the left hemidiaphragm.  There is streaky left basilar retrocardiac atelectasis or infiltrate best seen on lateral view.  IMPRESSION: No pulmonary edema.  Streaky left base retrocardiac  atelectasis or infiltrate best seen on lateral view.   Original Report Authenticated By: Natasha Mead, M.D.     Date: 10/20/2012  Rate: 75  Rhythm: normal sinus rhythm  QRS Axis: normal  Intervals: normal  ST/T Wave abnormalities: normal  Conduction Disutrbances:none  Narrative Interpretation:   Old EKG Reviewed: none available Chest x-ray viewed by me  No diagnosis found.  1:40 PM patient continues to complain of chest pain after treatment with intravenous morphine and aspirin gram orally. Additional morphine ordered.225 p.m. continues to complain of chest pain. Additional morphine ordered X-rays reviewed by me MDM  Patient's cardiac risk factors include male gender hypercholesterolemia and family history. He is a poor historian due to mental retardation. Suggests further evaluation to rule out coronary disease Results for orders placed during the hospital encounter of 10/20/12  CBC      Result Value Range   WBC 4.4  4.0 - 10.5 K/uL   RBC 4.15 (*) 4.22 - 5.81 MIL/uL   Hemoglobin 14.4  13.0 - 17.0 g/dL   HCT 16.1  09.6 - 04.5 %   MCV 100.0  78.0 - 100.0 fL   MCH 34.7 (*) 26.0 - 34.0 pg   MCHC 34.7  30.0 - 36.0 g/dL   RDW 40.9  81.1 - 91.4 %   Platelets 230  150 - 400 K/uL  BASIC METABOLIC PANEL      Result Value Range   Sodium 142  135 - 145 mEq/L   Potassium 4.0  3.5 - 5.1 mEq/L   Chloride 105  96 - 112 mEq/L   CO2 27  19 - 32 mEq/L   Glucose, Bld 100 (*) 70 - 99 mg/dL   BUN 14  6 - 23 mg/dL   Creatinine, Ser 7.82  0.50 - 1.35 mg/dL   Calcium 8.9  8.4 - 95.6 mg/dL   GFR calc non Af Amer 90 (*) >90 mL/min   GFR calc Af Amer >90  >90 mL/min  CBC WITH DIFFERENTIAL      Result Value Range   WBC 4.5  4.0 - 10.5 K/uL   RBC 3.90 (*) 4.22 - 5.81 MIL/uL   Hemoglobin 13.6  13.0 - 17.0 g/dL   HCT 21.3 (*) 08.6 - 57.8 %   MCV 99.5  78.0 - 100.0 fL   MCH 34.9 (*) 26.0 - 34.0 pg   MCHC 35.1  30.0 - 36.0 g/dL   RDW 46.9  62.9 - 52.8 %   Platelets 206  150 - 400 K/uL    Neutrophils Relative 54  43 - 77 %   Neutro Abs 2.4  1.7 - 7.7 K/uL   Lymphocytes Relative 35  12 - 46 %   Lymphs Abs 1.6  0.7 - 4.0 K/uL   Monocytes Relative 10  3 - 12 %   Monocytes Absolute 0.4  0.1 - 1.0 K/uL   Eosinophils Relative 0  0 - 5 %   Eosinophils Absolute 0.0  0.0 - 0.7 K/uL   Basophils Relative 2 (*) 0 - 1 %   Basophils Absolute 0.1  0.0 - 0.1 K/uL  POCT I-STAT TROPONIN I      Result Value Range   Troponin i, poc 0.01  0.00 - 0.08 ng/mL   Comment 3            Dg Chest 2 View  10/20/2012  *RADIOLOGY REPORT*  Clinical Data: Chest pain, shortness of breath  CHEST - 2 VIEW  Comparison: 06/25/2005  Findings: Cardiomediastinal silhouette is unremarkable. No pulmonary edema.  Mild elevation of the left hemidiaphragm.  There is streaky left basilar retrocardiac atelectasis or infiltrate best seen on lateral view.  IMPRESSION: No pulmonary edema.  Streaky left base retrocardiac atelectasis or infiltrate best seen on lateral view.   Original Report Authenticated By: Natasha Mead, M.D.    Ct Angio Chest Aorta W/cm &/or Wo/cm  10/20/2012  *RADIOLOGY REPORT*  Clinical Data: Chest pain, concern for aortic dissection or pulmonary embolism.  Acute onset anterior chest pain on the left  CT ANGIOGRAPHY CHEST  Technique:  Multidetector CT imaging of the chest using the standard protocol during bolus administration of intravenous contrast. Multiplanar reconstructed images including MIPs were obtained and reviewed to evaluate the vascular anatomy.  Contrast:  80 ml Omnipaque  Comparison: Chest radiograph 10/20/2012.  Findings: Noncontrast images demonstrate no intramural hematoma. Contrast enhanced images demonstrate no contour abnormality of the aorta to suggest aneurysm or dissection.  There is no mediastinal hematoma.  No pericardial fluid.  There is variant anatomy with carotid bifurcation at the level aorta.  While not optimized to evaluate the pulmonary arteries, and in fact there is poor  opacification of pulmonary arteries, there is no gross evidence of pulmonary embolism.  Review of the lung parenchyma demonstrates diffuse ground-glass opacities no focal consolidation.  Limited view of the upper abdomen is unremarkable.  Limited view of the skeleton is unremarkable.  The esophagus is gas filled.  No hiatal hernia evident.  IMPRESSION:  1.  No evidence of aortic dissection or aneurysm. 2.  No gross evidence of pulmonary embolism on limited exam. 3.  Diffuse ground-glass opacities [place] suggest mild pulmonary edema. 4.  Gas filled esophagus is nonspecific.   Original Report Authenticated By: Genevive Bi, M.D.     Spoke with Dr.Lama. Plan 23 hour observation. Dr. Sharl Ma arrived and 2:30 PMtook over care of patient Step down Diagnosis chest painat rest   CRITICAL CARE Performed by: Doug Sou   Total critical care time: 30 minute  Critical care time was exclusive of separately billable procedures and treating other patients.  Critical care was necessary to treat or prevent imminent or life-threatening deterioration.  Critical care was time spent personally by me on the following activities: development of treatment plan with patient and/or surrogate as well as nursing, discussions with consultants, evaluation of patient's response to treatment, examination of patient, obtaining history from patient or surrogate, ordering and performing treatments and interventions, ordering and review of laboratory studies, ordering and review of radiographic studies, pulse oximetry and re-evaluation of patient's condition. Doug Sou, MD 10/20/12 1432  Doug Sou, MD 10/20/12  1432  Doug Sou, MD 10/20/12 1436

## 2012-10-20 NOTE — ED Notes (Addendum)
Per family member, sudden onset of left side chest pain, pt has downs syndrome. ekg done at triage, no resp distress noted.

## 2012-10-20 NOTE — ED Notes (Signed)
CALL PATIENT'S SISTER WITH QUESTIONS Kenneth Batten CELL [phone], HOME [phone]

## 2012-10-21 ENCOUNTER — Telehealth: Payer: Self-pay | Admitting: Family Medicine

## 2012-10-21 DIAGNOSIS — G40909 Epilepsy, unspecified, not intractable, without status epilepticus: Secondary | ICD-10-CM

## 2012-10-21 DIAGNOSIS — R079 Chest pain, unspecified: Secondary | ICD-10-CM

## 2012-10-21 DIAGNOSIS — Q909 Down syndrome, unspecified: Secondary | ICD-10-CM

## 2012-10-21 DIAGNOSIS — E785 Hyperlipidemia, unspecified: Secondary | ICD-10-CM

## 2012-10-21 LAB — CBC
HCT: 38 % — ABNORMAL LOW (ref 39.0–52.0)
MCV: 97.7 fL (ref 78.0–100.0)
RBC: 3.89 MIL/uL — ABNORMAL LOW (ref 4.22–5.81)
RDW: 14.9 % (ref 11.5–15.5)
WBC: 3.1 10*3/uL — ABNORMAL LOW (ref 4.0–10.5)

## 2012-10-21 LAB — COMPREHENSIVE METABOLIC PANEL
BUN: 13 mg/dL (ref 6–23)
CO2: 28 mEq/L (ref 19–32)
Chloride: 101 mEq/L (ref 96–112)
Creatinine, Ser: 1.15 mg/dL (ref 0.50–1.35)
GFR calc Af Amer: 90 mL/min — ABNORMAL LOW (ref >90)
GFR calc non Af Amer: 78 mL/min — ABNORMAL LOW (ref >90)
Total Bilirubin: 0.2 mg/dL — ABNORMAL LOW (ref 0.3–1.2)

## 2012-10-21 LAB — GLUCOSE, CAPILLARY: Glucose-Capillary: 91 mg/dL (ref 70–99)

## 2012-10-21 MED ORDER — PANTOPRAZOLE SODIUM 40 MG PO TBEC: 40.0000 mg | DELAYED_RELEASE_TABLET | Freq: Every day | ORAL | Status: DC

## 2012-10-21 NOTE — Progress Notes (Signed)
Discharge information given to patient and sister at bedside. Verbalized understanding. Prescription given. Patient dressed and wheeled to car by RN. No distress noted.

## 2012-10-21 NOTE — Progress Notes (Signed)
Utilization Review Completed. 10/21/2012  

## 2012-10-21 NOTE — Discharge Summary (Signed)
DISCHARGE SUMMARY  Dre Gamino Samet  MR#: [id number]  DOB:07/31/1971  Date of Admission: 10/20/2012 Date of Discharge: 10/21/2012  Attending Physician:Soleia Badolato T  Patient's WUJ:[id number] Sharen Hones, MD  Consults:  none  Disposition: D/C Home w/ family  Follow-up Appts:     Follow-up Information   Follow up with Eustaquio Boyden, MD. Schedule an appointment as soon as possible for a visit in 10 days.   Contact information:   [address] [place] [address] [phone]       Discharge Diagnoses: Chest pain - ruled out for MI - ? GI source Down's syndrome  Seizure disorder  Hyperlipidemia  PCN allergy   Initial presentation: 42 y/o male with Down's syndrome who lives with his sister was brought to the hospital with one day history of chest pain. Chest pain started the morning of admit around 9:20 am while watching television. Pain was left parasternal. He denied nausea or vomiting, but was wretching. His sister reported that the wretching behavior is a chronic issue and starts when either a nurse or physician enters the room. He denied fever, but had increased frequency of urination. CT angio was done in the ED which was negative for aortic dissection or pulmonary embolism.  Hospital Course:  Chest pain  Troponin negative x3 - EKG unrevealing - CTA of chest w/o evidence of PE or vascualar abnormalities - no further inpt w/u indicated - family suspects malingering vs/ indigestion - I agree that reflux [place] be to blame - no worrisome sx to require inpt EGD - will initiate trial of PPI and advise f/u as outpt  ?mild pulmonary edema  Noted on CT of chest - no clinical evidence to suggest this - not noted on f/u CXR  Down's syndrome   Seizure disorder  Continue Phenobarbital and gabapentin   Hyperlipidemia  Continue pravachol   PCN allergy     Medication List    TAKE these medications       gabapentin 100 MG capsule  Commonly known as:  NEURONTIN   Take 200 mg by mouth 2 (two) times daily.     pantoprazole 40 MG tablet  Commonly known as:  PROTONIX  Take 1 tablet (40 mg total) by mouth at bedtime.     PHENobarbital 64.8 MG tablet  Commonly known as:  LUMINAL  Take 4 tablets by mouth every night at bedtime.     pravastatin 20 MG tablet  Commonly known as:  PRAVACHOL  Take 20 mg by mouth daily.     QUEtiapine 400 MG tablet  Commonly known as:  SEROQUEL  Take 400 mg by mouth daily.     vitamin B-12 1000 MCG tablet  Commonly known as:  CYANOCOBALAMIN  Take 1,000 mcg by mouth daily.        Day of Discharge BP 121/81  Pulse 66  Temp(Src) 98.5 F (36.9 C) (Oral)  Resp 16  Ht 5\' 1"  (1.549 m)  Wt 78.4 kg (172 lb 13.5 oz)  BMI 32.67 kg/m2  SpO2 91%  Physical Exam: General: No acute respiratory distress Lungs: Clear to auscultation bilaterally without wheezes or crackles Cardiovascular: Regular rate and rhythm without murmur gallop or rub  Abdomen: Nontender, nondistended, soft, bowel sounds positive, no rebound, no ascites, no appreciable mass Extremities: No significant cyanosis, clubbing, or edema bilateral lower extremities  Results for orders placed during the hospital encounter of 10/20/12 (from the past 24 hour(s))  CBC WITH DIFFERENTIAL     Status: Abnormal   Collection  Time    10/20/12  1:44 PM      Result Value Range   WBC 4.5  4.0 - 10.5 K/uL   RBC 3.90 (*) 4.22 - 5.81 MIL/uL   Hemoglobin 13.6  13.0 - 17.0 g/dL   HCT 16.1 (*) 09.6 - 04.5 %   MCV 99.5  78.0 - 100.0 fL   MCH 34.9 (*) 26.0 - 34.0 pg   MCHC 35.1  30.0 - 36.0 g/dL   RDW 40.9  81.1 - 91.4 %   Platelets 206  150 - 400 K/uL   Neutrophils Relative 54  43 - 77 %   Neutro Abs 2.4  1.7 - 7.7 K/uL   Lymphocytes Relative 35  12 - 46 %   Lymphs Abs 1.6  0.7 - 4.0 K/uL   Monocytes Relative 10  3 - 12 %   Monocytes Absolute 0.4  0.1 - 1.0 K/uL   Eosinophils Relative 0  0 - 5 %   Eosinophils Absolute 0.0  0.0 - 0.7 K/uL   Basophils Relative 2  (*) 0 - 1 %   Basophils Absolute 0.1  0.0 - 0.1 K/uL  MRSA PCR SCREENING     Status: None   Collection Time    10/20/12  4:13 PM      Result Value Range   MRSA by PCR NEGATIVE  NEGATIVE  TROPONIN I     Status: None   Collection Time    10/20/12  4:33 PM      Result Value Range   Troponin I <0.30  <0.30 ng/mL  GLUCOSE, CAPILLARY     Status: Abnormal   Collection Time    10/20/12  9:44 PM      Result Value Range   Glucose-Capillary 100 (*) 70 - 99 mg/dL  TROPONIN I     Status: None   Collection Time    10/20/12  9:49 PM      Result Value Range   Troponin I <0.30  <0.30 ng/mL  TROPONIN I     Status: None   Collection Time    10/21/12  4:20 AM      Result Value Range   Troponin I <0.30  <0.30 ng/mL  CBC     Status: Abnormal   Collection Time    10/21/12  4:20 AM      Result Value Range   WBC 3.1 (*) 4.0 - 10.5 K/uL   RBC 3.89 (*) 4.22 - 5.81 MIL/uL   Hemoglobin 13.1  13.0 - 17.0 g/dL   HCT 78.2 (*) 95.6 - 21.3 %   MCV 97.7  78.0 - 100.0 fL   MCH 33.7  26.0 - 34.0 pg   MCHC 34.5  30.0 - 36.0 g/dL   RDW 08.6  57.8 - 46.9 %   Platelets 206  150 - 400 K/uL  COMPREHENSIVE METABOLIC PANEL     Status: Abnormal   Collection Time    10/21/12  4:20 AM      Result Value Range   Sodium 138  135 - 145 mEq/L   Potassium 3.8  3.5 - 5.1 mEq/L   Chloride 101  96 - 112 mEq/L   CO2 28  19 - 32 mEq/L   Glucose, Bld 97  70 - 99 mg/dL   BUN 13  6 - 23 mg/dL   Creatinine, Ser 6.29  0.50 - 1.35 mg/dL   Calcium 8.4  8.4 - 52.8 mg/dL   Total Protein 7.0  6.0 - 8.3  g/dL   Albumin 3.2 (*) 3.5 - 5.2 g/dL   AST 14  0 - 37 U/L   ALT 11  0 - 53 U/L   Alkaline Phosphatase 134 (*) 39 - 117 U/L   Total Bilirubin 0.2 (*) 0.3 - 1.2 mg/dL   GFR calc non Af Amer 78 (*) >90 mL/min   GFR calc Af Amer 90 (*) >90 mL/min  GLUCOSE, CAPILLARY     Status: None   Collection Time    10/21/12  7:51 AM      Result Value Range   Glucose-Capillary 91  70 - 99 mg/dL    Time spent in discharge (includes  decision making & examination of pt): 30 minutes  10/21/2012, 12:27 PM   Lonia Blood, MD [facility] Office  [phone] Pager [phone]  On-Call/Text Page:      [email or web address]      password [facility]

## 2012-10-21 NOTE — Telephone Encounter (Signed)
Noted discharged for r/o chest pain. Can we call to see how PPI is working for possible indigestion and schedule f/u in next 2 weeks?  Thanks.

## 2012-10-22 NOTE — Telephone Encounter (Signed)
Spoke with patient's sister. She said patient is still complaining of discomfort but has only had 1 dose of the protonix. She will call me at the end of the week to let me know how he does. Follow up scheduled.

## 2012-10-31 ENCOUNTER — Encounter: Payer: Self-pay | Admitting: Family Medicine

## 2012-10-31 ENCOUNTER — Ambulatory Visit (INDEPENDENT_AMBULATORY_CARE_PROVIDER_SITE_OTHER): Payer: Medicare Other | Admitting: Family Medicine

## 2012-10-31 VITALS — BP 118/82 | HR 80 | Temp 97.6°F | Wt 174.8 lb

## 2012-10-31 DIAGNOSIS — R079 Chest pain, unspecified: Secondary | ICD-10-CM | POA: Insufficient documentation

## 2012-10-31 NOTE — Assessment & Plan Note (Addendum)
Hospital evaluation overall WNL. I also suspect indigestion/GERD as cause - agree with trial of protonix.  Suggested they take protonix 40mg  once daily regularly for 3-4 weeks then Spielmann try and space out (QOD and decrease as tolerated). Risk factors include family history, HLD, obesity. Body mass index is 33.04 kg/(m^2).

## 2012-10-31 NOTE — Progress Notes (Signed)
Subjective:    Patient ID: Kenneth [name], male    DOB: 02/20/1971, 42 y.o.   MRN: [id number]  HPI CC: hosp f/u  Mr. Bilal presents with his sister in law today after hospitalization for possible chest pain, difficulty with history 2/2 Down's syndrome.  Records reviewed.  Work up including normal cycled cardiac enzymes, stable EKG and negative CTA chest for PE or dissection.  ?indigestion/GERD as cause of sxs, pantoprazole was started.  CT also mentioned possible pulm edema.  Since home, no chest pain, nausea, vomiting, abd pain.  Denies trouble swallowing, choking with food, coughing.  Endorses dyspnea but according to sister in law, no complaints at home. 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  D/C summary: Date of Admission: 10/20/2012  Date of Discharge: 10/21/2012  Attending Physician:MCCLUNG,JEFFREY T  Patient's GMW:NUUVOZ Sharen Hones, MD  Consults:  none  Disposition: D/C Home w/ family  Follow-up Appts:      Follow-up Information    Follow up with Eustaquio Boyden, MD. Schedule an appointment as soon as possible for a visit in 10 days.    Contact information:    [address] [place] [address]  [phone]     Discharge Diagnoses:  Chest pain - ruled out for MI - ? GI source  Down's syndrome  Seizure disorder  Hyperlipidemia   Initial presentation:  42 y/o male with Down's syndrome who lives with his sister was brought to the hospital with one day history of chest pain. Chest pain started the morning of admit around 9:20 am while watching television. Pain was left parasternal. He denied nausea or vomiting, but was wretching. His sister reported that the wretching behavior is a chronic issue and starts when either a nurse or physician enters the room. He denied fever, but had increased frequency of urination. CT angio was done in the ED which was negative for aortic dissection or pulmonary embolism.  Hospital Course:  Chest pain  Troponin negative x3  - EKG unrevealing - CTA of chest w/o evidence of PE or vascular abnormalities - no further inpt w/u indicated - family suspects malingering vs/ indigestion - I agree that reflux [place] be to blame - no worrisome sx to require inpt EGD - will initiate trial of PPI and advise f/u as outpt  ?mild pulmonary edema  Noted on CT of chest - no clinical evidence to suggest this - not noted on f/u CXR  Down's syndrome  Seizure disorder  Continue Phenobarbital and gabapentin  Hyperlipidemia  Continue pravachol   PCN allergy    Medication List     TAKE these medications       gabapentin 100 MG capsule    Commonly known as: NEURONTIN    Take 200 mg by mouth 2 (two) times daily.    pantoprazole 40 MG tablet    Commonly known as: PROTONIX    Take 1 tablet (40 mg total) by mouth at bedtime.    PHENobarbital 64.8 MG tablet    Commonly known as: LUMINAL    Take 4 tablets by mouth every night at bedtime.    pravastatin 20 MG tablet    Commonly known as: PRAVACHOL    Take 20 mg by mouth daily.    QUEtiapine 400 MG tablet    Commonly known as: SEROQUEL    Take 400 mg by mouth daily.    vitamin B-12 1000 MCG tablet    Commonly known as: CYANOCOBALAMIN  Take 1,000 mcg by mouth daily.     Day of Discharge  BP 121/81  Pulse 66  Temp(Src) 98.5 F (36.9 C) (Oral)  Resp 16  Ht 5\' 1"  (1.549 m)  Wt 78.4 kg (172 lb 13.5 oz)  BMI 32.67 kg/m2  SpO2 91%  Physical Exam:  General: No acute respiratory distress  Lungs: Clear to auscultation bilaterally without wheezes or crackles  Cardiovascular: Regular rate and rhythm without murmur gallop or rub  Abdomen: Nontender, nondistended, soft, bowel sounds positive, no rebound, no ascites, no appreciable mass  Extremities: No significant cyanosis, clubbing, or edema bilateral lower extremities  Results for orders placed during the hospital encounter of 10/20/12 (from the past 24 hour(s))   CBC WITH DIFFERENTIAL Status: Abnormal    Collection Time     10/20/12 1:44 PM   Result  Value  Range    WBC  4.5  4.0 - 10.5 K/uL    RBC  3.90 (*)  4.22 - 5.81 MIL/uL    Hemoglobin  13.6  13.0 - 17.0 g/dL    HCT  16.1 (*)  09.6 - 52.0 %    MCV  99.5  78.0 - 100.0 fL    MCH  34.9 (*)  26.0 - 34.0 pg    MCHC  35.1  30.0 - 36.0 g/dL    RDW  04.5  40.9 - 81.1 %    Platelets  206  150 - 400 K/uL    Neutrophils Relative  54  43 - 77 %    Neutro Abs  2.4  1.7 - 7.7 K/uL    Lymphocytes Relative  35  12 - 46 %    Lymphs Abs  1.6  0.7 - 4.0 K/uL    Monocytes Relative  10  3 - 12 %    Monocytes Absolute  0.4  0.1 - 1.0 K/uL    Eosinophils Relative  0  0 - 5 %    Eosinophils Absolute  0.0  0.0 - 0.7 K/uL    Basophils Relative  2 (*)  0 - 1 %    Basophils Absolute  0.1  0.0 - 0.1 K/uL   MRSA PCR SCREENING Status: None    Collection Time    10/20/12 4:13 PM   Result  Value  Range    MRSA by PCR  NEGATIVE  NEGATIVE   TROPONIN I Status: None    Collection Time    10/20/12 4:33 PM   Result  Value  Range    Troponin I  <0.30  <0.30 ng/mL   GLUCOSE, CAPILLARY Status: Abnormal    Collection Time    10/20/12 9:44 PM   Result  Value  Range    Glucose-Capillary  100 (*)  70 - 99 mg/dL   TROPONIN I Status: None    Collection Time    10/20/12 9:49 PM   Result  Value  Range    Troponin I  <0.30  <0.30 ng/mL   TROPONIN I Status: None    Collection Time    10/21/12 4:20 AM   Result  Value  Range    Troponin I  <0.30  <0.30 ng/mL   CBC Status: Abnormal    Collection Time    10/21/12 4:20 AM   Result  Value  Range    WBC  3.1 (*)  4.0 - 10.5 K/uL    RBC  3.89 (*)  4.22 - 5.81 MIL/uL    Hemoglobin  13.1  13.0 -  17.0 g/dL    HCT  16.1 (*)  09.6 - 52.0 %    MCV  97.7  78.0 - 100.0 fL    MCH  33.7  26.0 - 34.0 pg    MCHC  34.5  30.0 - 36.0 g/dL    RDW  04.5  40.9 - 81.1 %    Platelets  206  150 - 400 K/uL   COMPREHENSIVE METABOLIC PANEL Status: Abnormal    Collection Time    10/21/12 4:20 AM   Result  Value  Range    Sodium  138  135 - 145 mEq/L     Potassium  3.8  3.5 - 5.1 mEq/L    Chloride  101  96 - 112 mEq/L    CO2  28  19 - 32 mEq/L    Glucose, Bld  97  70 - 99 mg/dL    BUN  13  6 - 23 mg/dL    Creatinine, Ser  9.14  0.50 - 1.35 mg/dL    Calcium  8.4  8.4 - 10.5 mg/dL    Total Protein  7.0  6.0 - 8.3 g/dL    Albumin  3.2 (*)  3.5 - 5.2 g/dL    AST  14  0 - 37 U/L    ALT  11  0 - 53 U/L    Alkaline Phosphatase  134 (*)  39 - 117 U/L    Total Bilirubin  0.2 (*)  0.3 - 1.2 mg/dL    GFR calc non Af Amer  78 (*)  >90 mL/min    GFR calc Af Amer  90 (*)  >90 mL/min   GLUCOSE, CAPILLARY Status: None    Collection Time    10/21/12 7:51 AM   Result  Value  Range    Glucose-Capillary  91  70 - 99 mg/dL     CT ANGIOGRAPHY CHEST  Technique: Multidetector CT imaging of the chest using the standard protocol during bolus administration of intravenous contrast. Multiplanar reconstructed images including MIPs were obtained and reviewed to evaluate the vascular anatomy.  Contrast: 80 ml Omnipaque  Comparison: Chest radiograph 10/20/2012.  Findings: Noncontrast images demonstrate no intramural hematoma. Contrast enhanced images demonstrate no contour abnormality of the aorta to suggest aneurysm or dissection. There is no mediastinal hematoma. No pericardial fluid.  There is variant anatomy with carotid bifurcation at the level aorta.  While not optimized to evaluate the pulmonary arteries, and in fact there is poor opacification of pulmonary arteries, there is no gross evidence of pulmonary embolism.  Review of the lung parenchyma demonstrates diffuse ground-glass opacities no focal consolidation.  Limited view of the upper abdomen is unremarkable.  Limited view of the skeleton is unremarkable.  The esophagus is gas filled. No hiatal hernia evident.  IMPRESSION:  1. No evidence of aortic dissection or aneurysm.  2. No gross evidence of pulmonary embolism on limited exam.  3. Diffuse ground-glass opacities Mcevoy suggest mild pulmonary  edema.  4. Gas filled esophagus is nonspecific.  Original Report Authenticated By: Genevive Bi, M.D.   Past Medical History  Diagnosis Date  . Down syndrome   . Seizure disorder   . HLD (hyperlipidemia)   . Cataracts, bilateral     Family History  Problem Relation Age of Onset  . Diabetes Mother   . Diabetes Father   . Hypertension Mother     Cerebral hemorrhage  . Hypertension Father   . Coronary artery disease Father 66  MI  . Stroke Father   . Cancer Maternal Grandmother     Oral  . Cancer Maternal Aunt     Colon   Review of Systems Per HPI    Objective:   Physical Exam  Nursing note and vitals reviewed. Constitutional: He appears well-developed and well-nourished. No distress.  HENT:  Head: Normocephalic and atraumatic.  Mouth/Throat: Oropharynx is clear and moist. No oropharyngeal exudate.  Eyes: Conjunctivae and EOM are normal. Pupils are equal, round, and reactive to light.  Neck: Normal range of motion. Neck supple. No JVD present.  Cardiovascular: Normal rate, regular rhythm, normal heart sounds and intact distal pulses.   No murmur heard. Pulmonary/Chest: Effort normal and breath sounds normal. No respiratory distress. He has no wheezes. He has no rales.  Abdominal: Soft. Normal appearance and bowel sounds are normal. He exhibits no distension and no mass. There is no hepatosplenomegaly. There is no tenderness. There is no rigidity, no rebound, no guarding and negative Murphy's sign. A hernia is present. Hernia confirmed positive in the ventral area (small).  Musculoskeletal: He exhibits no edema.  Lymphadenopathy:    He has no cervical adenopathy.  Skin: Skin is warm and dry. No rash noted.  Psychiatric: He has a normal mood and affect.       Assessment & Plan:

## 2012-10-31 NOTE — Patient Instructions (Addendum)
Workup at the hospital was overall normal. Continue protonix for 3-4 weeks daily, then try to slowly taper down (start with every other day and slowly decrease as needed). Good to see you today, call us with questions.

## 2012-11-07 ENCOUNTER — Telehealth: Payer: Self-pay

## 2012-11-07 ENCOUNTER — Other Ambulatory Visit: Payer: Self-pay | Admitting: Family Medicine

## 2012-11-07 NOTE — Telephone Encounter (Signed)
Kenneth [name] left v/m that insurance needs more info about continuing to fill phenobarb; left v/m for Kenneth Batten to get more info.

## 2012-11-07 NOTE — Telephone Encounter (Signed)
Cala Bradford said she is not sure what is needed other than info she left me in previous note; pt uses Facilities manager.[place] at [facility] said she is not sure if prior Berkley Harvey is needed or not; wonders if pt got something thru the mail; [place] at [facility] will refill to see  what is needed.

## 2012-11-07 NOTE — Telephone Encounter (Signed)
Caller: Kimberly/Sibling; Phone: [phone]; Reason for Call: Returning call to office staff.  [employer] insurance will no longer cover Phenobarbital unless MD sends health information and calls [facility] Physicain Referral Team at [phone].  Please call Cala Bradford back.

## 2012-11-14 ENCOUNTER — Other Ambulatory Visit: Payer: Self-pay

## 2012-11-14 MED ORDER — PANTOPRAZOLE SODIUM 40 MG PO TBEC: 40.0000 mg | DELAYED_RELEASE_TABLET | Freq: Every day | ORAL | Status: DC

## 2012-11-14 NOTE — Telephone Encounter (Signed)
Kenneth [name] request refill Pantoprazole sent to [facility] with AVS instructions from 10/31/12 visit.Take one daily then slowly taper by taking one tab every other day and slowly decreasing as needed.Please advise.

## 2012-11-16 ENCOUNTER — Other Ambulatory Visit: Payer: Self-pay | Admitting: Family Medicine

## 2012-11-18 NOTE — Telephone Encounter (Signed)
plz phone in. 

## 2012-11-18 NOTE — Telephone Encounter (Signed)
Rx called in as directed.   

## 2012-11-19 ENCOUNTER — Telehealth: Payer: Self-pay | Admitting: *Deleted

## 2012-11-19 NOTE — Telephone Encounter (Signed)
PA form for patient in your IN box.

## 2012-11-20 NOTE — Telephone Encounter (Signed)
Filled and placed in my out box. 

## 2012-11-26 NOTE — Telephone Encounter (Signed)
Never heard back from patient's sister or pharmacy, but did receive PA for quantity override later. New note started.

## 2012-11-29 NOTE — Telephone Encounter (Signed)
Received approval via fax. Patient's sister and pharmacy notified.

## 2012-11-29 NOTE — Telephone Encounter (Signed)
Refaxed as requested. 

## 2012-11-29 NOTE — Telephone Encounter (Signed)
Patient's sister came in questioning the status of PA. Advised that I hadn't heard anything back yet and that I had re-faxed paperwork this AM. She was very upset that it was taking so long and couldn't understand why they were requiring this on a medication that he has been on for so long that he is obviously stable on. I told her I didn't know the answer to that, but assured her I would take care of it. I called the insurance company again. They said they still didn't receive my fax from this AM. There was no "fax failed" form on my end and I confirmed the fax number that I sent it to. I did the PA over the phone and requested that it be expedited as this was a seizure med that could potentially cause him to have seizures if he goes without it. She said it could take 24-48 hours. Patient's sister said she has enough med to last until Monday. Will again wait determination.

## 2012-11-29 NOTE — Telephone Encounter (Signed)
[place] from [place] request status of PA; spoke with Renessa at [phone] for PA status; Renessa does not have PA for phenobarb. Renessa request PA faxed to [phone].

## 2013-02-10 ENCOUNTER — Other Ambulatory Visit: Payer: Self-pay | Admitting: Family Medicine

## 2013-06-10 ENCOUNTER — Other Ambulatory Visit: Payer: Self-pay | Admitting: Family Medicine

## 2013-06-12 ENCOUNTER — Other Ambulatory Visit: Payer: Self-pay | Admitting: Family Medicine

## 2013-06-16 ENCOUNTER — Other Ambulatory Visit: Payer: Self-pay | Admitting: Family Medicine

## 2013-07-11 ENCOUNTER — Encounter: Payer: Self-pay | Admitting: Family Medicine

## 2013-07-11 ENCOUNTER — Ambulatory Visit (INDEPENDENT_AMBULATORY_CARE_PROVIDER_SITE_OTHER): Payer: Medicare Other | Admitting: Family Medicine

## 2013-07-11 VITALS — BP 120/80 | HR 68 | Temp 97.6°F | Ht 66.5 in | Wt 170.5 lb

## 2013-07-11 DIAGNOSIS — E538 Deficiency of other specified B group vitamins: Secondary | ICD-10-CM

## 2013-07-11 DIAGNOSIS — Z0000 Encounter for general adult medical examination without abnormal findings: Secondary | ICD-10-CM

## 2013-07-11 DIAGNOSIS — Q909 Down syndrome, unspecified: Secondary | ICD-10-CM

## 2013-07-11 DIAGNOSIS — Z23 Encounter for immunization: Secondary | ICD-10-CM

## 2013-07-11 DIAGNOSIS — G40909 Epilepsy, unspecified, not intractable, without status epilepticus: Secondary | ICD-10-CM

## 2013-07-11 DIAGNOSIS — E785 Hyperlipidemia, unspecified: Secondary | ICD-10-CM

## 2013-07-11 LAB — CBC WITH DIFFERENTIAL/PLATELET
Basophils Absolute: 0 10*3/uL (ref 0.0–0.1)
Basophils Relative: 0.9 % (ref 0.0–3.0)
Eosinophils Relative: 0.8 % (ref 0.0–5.0)
Lymphocytes Relative: 31.6 % (ref 12.0–46.0)
Lymphs Abs: 1.4 10*3/uL (ref 0.7–4.0)
Monocytes Relative: 10.5 % (ref 3.0–12.0)
Platelets: 293 10*3/uL (ref 150.0–400.0)
RBC: 4.21 Mil/uL — ABNORMAL LOW (ref 4.22–5.81)
RDW: 14.8 % — ABNORMAL HIGH (ref 11.5–14.6)

## 2013-07-11 LAB — COMPREHENSIVE METABOLIC PANEL
ALT: 10 U/L (ref 0–53)
AST: 13 U/L (ref 0–37)
Alkaline Phosphatase: 125 U/L — ABNORMAL HIGH (ref 39–117)
BUN: 13 mg/dL (ref 6–23)
Chloride: 100 mEq/L (ref 96–112)
Creatinine, Ser: 1.3 mg/dL (ref 0.4–1.5)
Total Bilirubin: 0.5 mg/dL (ref 0.3–1.2)

## 2013-07-11 LAB — TSH: TSH: 1.17 u[IU]/mL (ref 0.35–5.50)

## 2013-07-11 LAB — LIPID PANEL
Cholesterol: 170 mg/dL (ref 0–200)
HDL: 51.7 mg/dL (ref >39.00)
LDL Cholesterol: 96 mg/dL (ref 0–99)
Total CHOL/HDL Ratio: 3
Triglycerides: 110 mg/dL (ref 0.0–149.0)
VLDL: 22 mg/dL (ref 0.0–40.0)

## 2013-07-11 NOTE — Assessment & Plan Note (Signed)
I have personally reviewed the Medicare Annual Wellness questionnaire and have noted 1. The patient's medical and social history 2. Their use of alcohol, tobacco or illicit drugs 3. Their current medications and supplements 4. The patient's functional ability including ADL's, fall risks, home safety risks and hearing or visual impairment. 5. Diet and physical activity 6. Evidence for depression or mood disorders The patients weight, height, BMI have been recorded in the chart.  Hearing and vision has been addressed. I have made referrals, counseling and provided education to the patient based review of the above and I have provided the pt with a written personalized care plan for preventive services. See scanned questionairre.  Reviewed preventative protocols and updated unless pt declined.  Flu shot today.

## 2013-07-11 NOTE — Patient Instructions (Signed)
Flu shot today Blood work today. Good to see you today, call us with questions. Return as needed or in 1 year for physical.

## 2013-07-11 NOTE — Assessment & Plan Note (Signed)
Chronic, stable on phenobarb - check level today.

## 2013-07-11 NOTE — Assessment & Plan Note (Signed)
Followed by psych.  Monitoring for dementia.

## 2013-07-11 NOTE — Assessment & Plan Note (Signed)
Check FLP today. Continue pravastatin. 

## 2013-07-11 NOTE — Addendum Note (Signed)
Addended by: Josph Macho A on: 07/11/2013 02:35 PM   Modules accepted: Orders

## 2013-07-11 NOTE — Assessment & Plan Note (Signed)
Lab Results  Component Value Date   VITAMINB12 705 03/30/2011

## 2013-07-11 NOTE — Progress Notes (Signed)
Subjective:    Patient ID: Kenneth [name], male    DOB: Jul 06, 1971, 42 y.o.   MRN: [id number]  HPI CC: medicare wellness   Some moodiness - possibly worsening.  To discuss with psychiatrist at upcoming appt.  No trouble sleeping.  Appetite good.  Not agitated or aggressive. Wt Readings from Last 3 Encounters:  07/11/13 170 lb 8 oz (77.338 kg)  10/31/12 174 lb 12 oz (79.266 kg)  10/21/12 172 lb 13.5 oz (78.4 kg)  Body mass index is 27.11 kg/(m^2).  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  Here for follow up of Down's, seizure disorder, elevated cholesterol, and yearly physical for DSS  On pravastatin for cholesterol.  Sees [place] psychologist and [facility] psychiatrist  Preventative Last CPE 2012. Flu shot today Td 2010  Medications and allergies reviewed and updated in chart.  Past histories reviewed and updated if relevant as below. Patient Active Problem List   Diagnosis Date Noted  . Chest pain, unspecified 10/31/2012  . Down syndrome   . Seizure disorder   . VITAMIN B12 DEFICIENCY 04/22/2010  . HLD (hyperlipidemia) 02/07/2007  . 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 Down syndrome   . Seizure disorder   . HLD (hyperlipidemia)   . Cataracts, bilateral    History reviewed. No pertinent past surgical history. History  Substance Use Topics  . Smoking status: Never Smoker   . Smokeless tobacco: Not on file  . Alcohol Use: No   Family History  Problem Relation Age of Onset  . Diabetes Mother   . Diabetes Father   . Hypertension Mother     Cerebral hemorrhage  . Hypertension Father   . Coronary artery disease Father 7    MI  . Stroke Father   . Cancer Maternal Grandmother     Oral  . Cancer Maternal Aunt     Colon   Allergies  Allergen Reactions  . Amoxicillin-Pot Clavulanate     unspecified  . Penicillins    Current Outpatient Prescriptions on File Prior to  Visit  Medication Sig Dispense Refill  . gabapentin (NEURONTIN) 100 MG capsule Take 200 mg by mouth 2 (two) times daily.        Marland Kitchen PHENobarbital (LUMINAL) 64.8 MG tablet TAKE 4 TABLETS BY MOUTH EVERY NIGHT AT BEDTIME  360 tablet  5  . pravastatin (PRAVACHOL) 20 MG tablet TAKE 1 TABLET BY MOUTH DAILY  30 tablet  11  . QUEtiapine (SEROQUEL) 400 MG tablet Take 400 mg by mouth daily.        . vitamin B-12 (CYANOCOBALAMIN) 1000 MCG tablet Take 1,000 mcg by mouth daily.         No current facility-administered medications on file prior to visit.      Review of Systems  Constitutional: Negative for fever, chills, activity change, appetite change, fatigue and unexpected weight change.  HENT: Negative for hearing loss.   Eyes: Negative for visual disturbance.  Respiratory: Negative for cough, chest tightness, shortness of breath and wheezing.   Cardiovascular: Negative for chest pain, palpitations and leg swelling.  Gastrointestinal: Negative for nausea, vomiting, abdominal pain, diarrhea, constipation, blood in stool and abdominal distention.  Genitourinary: Negative for hematuria and difficulty urinating.  Musculoskeletal: Negative for arthralgias, myalgias and neck pain.  Skin: Negative for rash.  Neurological: Negative for dizziness, seizures, syncope and headaches.  Hematological: Negative for adenopathy. Does not bruise/bleed easily.  Psychiatric/Behavioral: Negative  for dysphoric mood. The patient is not nervous/anxious.        Objective:   Physical Exam  Nursing note and vitals reviewed. Constitutional: He is oriented to person, place, and time. He appears well-developed and well-nourished. No distress.  HENT:  Head: Normocephalic and atraumatic.  Right Ear: Hearing, tympanic membrane, external ear and ear canal normal.  Left Ear: Hearing, tympanic membrane, external ear and ear canal normal.  Nose: Nose normal.  Mouth/Throat: Oropharynx is clear and moist. No oropharyngeal  exudate.  Eyes: Conjunctivae and EOM are normal. Pupils are equal, round, and reactive to light. No scleral icterus.  Neck: Normal range of motion. Neck supple.  Cardiovascular: Normal rate, regular rhythm, normal heart sounds and intact distal pulses.   No murmur heard. Pulses:      Radial pulses are 2+ on the right side, and 2+ on the left side.  Pulmonary/Chest: Effort normal and breath sounds normal. No respiratory distress. He has no wheezes. He has no rales.  Abdominal: Soft. Bowel sounds are normal. He exhibits no distension and no mass. There is no tenderness. There is no rebound and no guarding.  Musculoskeletal: Normal range of motion. He exhibits no edema.  Lymphadenopathy:    He has no cervical adenopathy.  Neurological: He is alert and oriented to person, place, and time.  CN grossly intact, station and gait intact  Skin: Skin is warm and dry. No rash noted.  Psychiatric: He has a normal mood and affect. His behavior is normal. Judgment and thought content normal.       Assessment & Plan:

## 2013-07-11 NOTE — Progress Notes (Signed)
Pre-visit discussion using our clinic review tool. No additional management support is needed unless otherwise documented below in the visit note.  

## 2013-07-12 LAB — PHENYTOIN LEVEL, TOTAL: Phenytoin Lvl: <0.5 ug/mL — ABNORMAL LOW (ref 10.0–20.0)

## 2013-09-02 DIAGNOSIS — F72 Severe intellectual disabilities: Secondary | ICD-10-CM | POA: Diagnosis not present

## 2013-09-02 DIAGNOSIS — F639 Impulse disorder, unspecified: Secondary | ICD-10-CM | POA: Diagnosis not present

## 2013-09-03 DIAGNOSIS — IMO0002 Reserved for concepts with insufficient information to code with codable children: Secondary | ICD-10-CM | POA: Diagnosis not present

## 2013-11-06 ENCOUNTER — Telehealth: Payer: Self-pay

## 2013-11-06 ENCOUNTER — Encounter: Payer: Self-pay | Admitting: Family Medicine

## 2013-11-06 ENCOUNTER — Ambulatory Visit (INDEPENDENT_AMBULATORY_CARE_PROVIDER_SITE_OTHER): Payer: Medicare Other | Admitting: Family Medicine

## 2013-11-06 VITALS — BP 96/64 | HR 71 | Temp 98.1°F | Wt 165.5 lb

## 2013-11-06 DIAGNOSIS — Z23 Encounter for immunization: Secondary | ICD-10-CM | POA: Diagnosis not present

## 2013-11-06 DIAGNOSIS — E785 Hyperlipidemia, unspecified: Secondary | ICD-10-CM

## 2013-11-06 DIAGNOSIS — Z2089 Contact with and (suspected) exposure to other communicable diseases: Secondary | ICD-10-CM | POA: Diagnosis not present

## 2013-11-06 DIAGNOSIS — Z20818 Contact with and (suspected) exposure to other bacterial communicable diseases: Secondary | ICD-10-CM

## 2013-11-06 NOTE — Progress Notes (Signed)
   BP 96/64  Pulse 71  Temp(Src) 98.1 F (36.7 C) (Oral)  Wt 165 lb 8 oz (75.07 kg)  SpO2 96%   CC: pertussis exposure  Subjective:    Patient ID: Kenneth [name], male    DOB: Jan 03, 1971, 43 y.o.   MRN: [id number]  HPI: Kenneth [name] is a 43 y.o. male presenting on 11/06/2013 for exposed to whooping cough   Presents with caregiver.  H/o MR. Received letter today about possible exposure to whooping cough on [name] that drives him to work. Needs note that it's ok to return to work from me prior to return.    Exposure was a few weeks ago.  No cough, fever, congestion.  Feeling well.   No recent viral infection.  No night sweats. No recent Tdap.  Relevant past medical, surgical, family and social history reviewed and updated as indicated.  Allergies and medications reviewed and updated. Current Outpatient Prescriptions on File Prior to Visit  Medication Sig  . gabapentin (NEURONTIN) 100 MG capsule Take 200 mg by mouth 2 (two) times daily.    Marland Kitchen. PHENobarbital (LUMINAL) 64.8 MG tablet TAKE 4 TABLETS BY MOUTH EVERY NIGHT AT BEDTIME  . pravastatin (PRAVACHOL) 20 MG tablet TAKE 1 TABLET BY MOUTH DAILY  . QUEtiapine (SEROQUEL) 400 MG tablet Take 400 mg by mouth daily.    . vitamin B-12 (CYANOCOBALAMIN) 1000 MCG tablet Take 1,000 mcg by mouth daily.     No current facility-administered medications on file prior to visit.    Review of Systems Per HPI unless specifically indicated above    Objective:    BP 96/64  Pulse 71  Temp(Src) 98.1 F (36.7 C) (Oral)  Wt 165 lb 8 oz (75.07 kg)  SpO2 96%  Physical Exam  Nursing note and vitals reviewed. Constitutional: He appears well-developed and well-nourished. No distress.  HENT:  Head: Normocephalic and atraumatic.  Right Ear: Hearing, tympanic membrane, external ear and ear canal normal.  Left Ear: Hearing, tympanic membrane, external ear and ear canal normal.  Nose: Nose normal. No mucosal edema or rhinorrhea. Right sinus exhibits no  maxillary sinus tenderness and no frontal sinus tenderness. Left sinus exhibits no maxillary sinus tenderness and no frontal sinus tenderness.  Mouth/Throat: Uvula is midline, oropharynx is clear and moist and mucous membranes are normal. No oropharyngeal exudate, posterior oropharyngeal edema, posterior oropharyngeal erythema or tonsillar abscesses.  Eyes: Conjunctivae and EOM are normal. Pupils are equal, round, and reactive to light. No scleral icterus.  Neck: Normal range of motion. Neck supple.  Cardiovascular: Normal rate, regular rhythm, normal heart sounds and intact distal pulses.   No murmur heard. Pulmonary/Chest: Effort normal and breath sounds normal. No respiratory distress. He has no wheezes. He has no rales.  Lymphadenopathy:    He has no cervical adenopathy.  Skin: Skin is warm and dry. No rash noted.       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 No concern for infection. Will check swab as letter from public health dept says "MUST" test. Tdap today as he has not had one.  Last Td 2010. Update us if new sxs develop. Ok to return to work - Physicist, medicalletter provided today. Caregiver agrees with plan.        Follow up plan: Return if symptoms worsen or fail to improve.

## 2013-11-06 NOTE — Telephone Encounter (Signed)
Kim called back pts employer said pt needed to be checked and tested to see if has whooping cough bacteria. Pt cannot return to work until checked. Pt was exposed to a person who has been diagnosed with whooping cough. Selena BattenKim wants pt seen and tested today. Scheduled appt with Dr Reece AgarG today at 4:45 pm.

## 2013-11-06 NOTE — Telephone Encounter (Signed)
Pt seen today

## 2013-11-06 NOTE — Progress Notes (Signed)
Pre visit review using our clinic review tool, if applicable. No additional management support is needed unless otherwise documented below in the visit note. 

## 2013-11-06 NOTE — Telephone Encounter (Signed)
Kimberly left v/m that within the last 2 weeks pt was exposed to whooping cough and wants to know if pt should be put on medication now or wait to see if pt develops symptoms. Unable to reach [place] or pt by phone.Please advise.

## 2013-11-06 NOTE — Patient Instructions (Signed)
Danzel looks good today.  We will test for pertussis (whooping cough) today and call you with results. Please let me know right away if any new symptoms like fever or cough. Good to see you today, call us with questions.

## 2013-11-06 NOTE — Assessment & Plan Note (Signed)
Asxs. No concern for infection. Will check swab as letter from public health dept says "MUST" test. Tdap today as he has not had one.  Last Td 2010. Update us if new sxs develop. Ok to return to work - Physicist, medicalletter provided today. Caregiver agrees with plan.

## 2013-11-20 LAB — CULTURE, BORDETELLA PERTUSSIS

## 2014-01-02 ENCOUNTER — Other Ambulatory Visit: Payer: Self-pay | Admitting: Family Medicine

## 2014-03-04 DIAGNOSIS — F639 Impulse disorder, unspecified: Secondary | ICD-10-CM | POA: Diagnosis not present

## 2014-03-04 DIAGNOSIS — F72 Severe intellectual disabilities: Secondary | ICD-10-CM | POA: Diagnosis not present

## 2014-05-28 ENCOUNTER — Other Ambulatory Visit: Payer: Self-pay | Admitting: Family Medicine

## 2014-06-10 DIAGNOSIS — F332 Major depressive disorder, recurrent severe without psychotic features: Secondary | ICD-10-CM | POA: Diagnosis not present

## 2014-06-10 DIAGNOSIS — F918 Other conduct disorders: Secondary | ICD-10-CM | POA: Diagnosis not present

## 2014-06-11 ENCOUNTER — Ambulatory Visit (INDEPENDENT_AMBULATORY_CARE_PROVIDER_SITE_OTHER): Payer: Medicare Other | Admitting: Family Medicine

## 2014-06-11 ENCOUNTER — Telehealth: Payer: Self-pay

## 2014-06-11 ENCOUNTER — Encounter: Payer: Self-pay | Admitting: Family Medicine

## 2014-06-11 ENCOUNTER — Ambulatory Visit (INDEPENDENT_AMBULATORY_CARE_PROVIDER_SITE_OTHER)
Admission: RE | Admit: 2014-06-11 | Discharge: 2014-06-11 | Disposition: A | Discharge status: Home / Self Care | Payer: Medicare Other | Source: Ambulatory Visit | Attending: Family Medicine | Admitting: Family Medicine

## 2014-06-11 VITALS — BP 108/60 | HR 72 | Temp 98.0°F

## 2014-06-11 DIAGNOSIS — M79671 Pain in right foot: Secondary | ICD-10-CM | POA: Insufficient documentation

## 2014-06-11 NOTE — Telephone Encounter (Signed)
Pt said starting having rt foot pain on 06/08/14; pt said fell off porch and motioned that he turned his ankle; pt said dark on bottom of foot; when pt tried to stand could not bear weight on rt foot; pt was seated in w/c and Dr Reece AgarG said would see pt today around 12 noon.

## 2014-06-11 NOTE — Progress Notes (Signed)
   BP 108/60 mmHg  Pulse 72  Temp(Src) 98 F (36.7 C) (Oral)   CC: foot pain  Subjective:    Patient ID: Kenneth [name], male    DOB: 1971-05-20, 43 y.o.   MRN: [id number]  HPI: Kenneth SalviaRichard M Bamber is a 43 y.o. male presenting on 06/11/2014 for Foot Pain   Came into office today with caregiver for her sick visit, but pt was endorsing R foot pain. History complicated by developmental delay. Sister unaware of any injury. Off and on foot pain over last month - caregiver thinks he only complains of foot pain when he doesn't want to do something, so didn't want to work today and started complaining of pain.   Pt states he hurts from great toe down medial midfoot to ankle and some pain up leg. Pt stated he fell off caregiver's porch but caregiver states this did not and cannot happen as she has rails along entire porch.  Relevant past medical, surgical, family and social history reviewed and updated as indicated.  Allergies and medications reviewed and updated. Current Outpatient Prescriptions on File Prior to Visit  Medication Sig  . gabapentin (NEURONTIN) 100 MG capsule Take 200 mg by mouth 2 (two) times daily.    Marland Kitchen. PHENobarbital (LUMINAL) 64.8 MG tablet TAKE FOUR (4) TABLETS BY MOUTH AT BEDTIME  . pravastatin (PRAVACHOL) 20 MG tablet TAKE 1 TABLET BY MOUTH DAILY  . vitamin B-12 (CYANOCOBALAMIN) 1000 MCG tablet Take 1,000 mcg by mouth daily.     No current facility-administered medications on file prior to visit.   Past Medical History  Diagnosis Date  . Down syndrome   . Seizure disorder   . HLD (hyperlipidemia)   . Cataracts, bilateral     Review of Systems Per HPI unless specifically indicated above    Objective:    BP 108/60 mmHg  Pulse 72  Temp(Src) 98 F (36.7 C) (Oral)  Physical Exam  Constitutional: He appears well-developed and well-nourished. No distress.  Musculoskeletal: He exhibits no edema.  2+ DP/PT bilaterally No ligament laxity. 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  Results for orders placed or performed in visit on 11/06/13  Culture, Bordetella  Result Value Ref Range   Result/Comment REPORT       Assessment & Plan:   Problem List Items Addressed This Visit    Right foot pain - Primary    Difficult history/exam 2/2 developmental delay. No focal abnormality other than pes planus appreciated today. Check Xray today to r/o bony pathology given difficulty with story. Anticipate no true pathology other than discomfort from pes planus. Discussed insole with better arch support bilaterally. If not better with this, consider referral to SM to discuss orthotics Caregiver agrees with plan.    Relevant Orders      DG Foot Complete Right       Follow up plan: Return if symptoms worsen or fail to improve.

## 2014-06-11 NOTE — Progress Notes (Signed)
Pre visit review using our clinic review tool, if applicable. No additional management support is needed unless otherwise documented below in the visit note. 

## 2014-06-11 NOTE — Assessment & Plan Note (Signed)
Difficult history/exam 2/2 developmental delay. No focal abnormality other than pes planus appreciated today. Check Xray today to r/o bony pathology given difficulty with story. Anticipate no true pathology other than discomfort from pes planus. Discussed insole with better arch support bilaterally. If not better with this, consider referral to SM to discuss orthotics Caregiver agrees with plan.

## 2014-06-11 NOTE — Patient Instructions (Signed)
I think foot is looking ok today. Try OTC insoles with arch support for feet. If not better with this, consider seeing Dr Patsy Lageropland to discuss possible orthotics. Good to see you, call us with questions.

## 2014-06-12 ENCOUNTER — Ambulatory Visit: Payer: Medicare Other | Admitting: Family Medicine

## 2014-07-05 ENCOUNTER — Other Ambulatory Visit: Payer: Self-pay | Admitting: Family Medicine

## 2014-07-05 DIAGNOSIS — E785 Hyperlipidemia, unspecified: Secondary | ICD-10-CM

## 2014-07-05 DIAGNOSIS — G40909 Epilepsy, unspecified, not intractable, without status epilepticus: Secondary | ICD-10-CM

## 2014-07-05 DIAGNOSIS — E538 Deficiency of other specified B group vitamins: Secondary | ICD-10-CM

## 2014-07-06 ENCOUNTER — Other Ambulatory Visit (INDEPENDENT_AMBULATORY_CARE_PROVIDER_SITE_OTHER): Payer: Medicare Other

## 2014-07-06 DIAGNOSIS — E785 Hyperlipidemia, unspecified: Secondary | ICD-10-CM

## 2014-07-06 DIAGNOSIS — E538 Deficiency of other specified B group vitamins: Secondary | ICD-10-CM

## 2014-07-06 DIAGNOSIS — G40909 Epilepsy, unspecified, not intractable, without status epilepticus: Secondary | ICD-10-CM

## 2014-07-06 LAB — CBC WITH DIFFERENTIAL/PLATELET
Basophils Absolute: 0.1 10*3/uL (ref 0.0–0.1)
Basophils Relative: 1.6 % (ref 0.0–3.0)
Eosinophils Absolute: 0.1 10*3/uL (ref 0.0–0.7)
Eosinophils Relative: 2.2 % (ref 0.0–5.0)
HCT: 41 % (ref 39.0–52.0)
Hemoglobin: 13.5 g/dL (ref 13.0–17.0)
Lymphocytes Relative: 37.7 % (ref 12.0–46.0)
Lymphs Abs: 1.3 10*3/uL (ref 0.7–4.0)
MCHC: 33 g/dL (ref 30.0–36.0)
MCV: 102.1 fl — AB (ref 78.0–100.0)
Monocytes Absolute: 0.5 10*3/uL (ref 0.1–1.0)
Monocytes Relative: 14.7 % — ABNORMAL HIGH (ref 3.0–12.0)
NEUTROS PCT: 43.8 % (ref 43.0–77.0)
Neutro Abs: 1.5 10*3/uL (ref 1.4–7.7)
PLATELETS: 254 10*3/uL (ref 150.0–400.0)
RBC: 4.02 Mil/uL — ABNORMAL LOW (ref 4.22–5.81)
RDW: 15.3 % (ref 11.5–15.5)
WBC: 3.5 10*3/uL — AB (ref 4.0–10.5)

## 2014-07-06 LAB — COMPREHENSIVE METABOLIC PANEL
ALBUMIN: 3.5 g/dL (ref 3.5–5.2)
ALK PHOS: 119 U/L — AB (ref 39–117)
ALT: 13 U/L (ref 0–53)
AST: 17 U/L (ref 0–37)
BUN: 13 mg/dL (ref 6–23)
CHLORIDE: 101 meq/L (ref 96–112)
CO2: 30 meq/L (ref 19–32)
Calcium: 8.8 mg/dL (ref 8.4–10.5)
Creatinine, Ser: 1.3 mg/dL (ref 0.4–1.5)
GFR: 66.37 mL/min (ref >60.00)
GLUCOSE: 98 mg/dL (ref 70–99)
POTASSIUM: 3.9 meq/L (ref 3.5–5.1)
SODIUM: 138 meq/L (ref 135–145)
TOTAL PROTEIN: 7.1 g/dL (ref 6.0–8.3)
Total Bilirubin: 0.4 mg/dL (ref 0.2–1.2)

## 2014-07-06 LAB — LIPID PANEL
CHOLESTEROL: 152 mg/dL (ref 0–200)
HDL: 50.5 mg/dL (ref >39.00)
LDL Cholesterol: 81 mg/dL (ref 0–99)
NonHDL: 101.5
Total CHOL/HDL Ratio: 3
Triglycerides: 101 mg/dL (ref 0.0–149.0)
VLDL: 20.2 mg/dL (ref 0.0–40.0)

## 2014-07-06 LAB — VITAMIN B12: VITAMIN B 12: 915 pg/mL — AB (ref 211–911)

## 2014-07-07 LAB — PHENOBARBITAL LEVEL: PHENOBARBITAL: 39.4 mg/L (ref 15.0–40.0)

## 2014-07-13 ENCOUNTER — Ambulatory Visit (INDEPENDENT_AMBULATORY_CARE_PROVIDER_SITE_OTHER): Payer: Medicare Other | Admitting: Family Medicine

## 2014-07-13 ENCOUNTER — Encounter: Payer: Self-pay | Admitting: Family Medicine

## 2014-07-13 VITALS — BP 104/62 | HR 76 | Temp 97.6°F | Ht 66.0 in | Wt 167.5 lb

## 2014-07-13 DIAGNOSIS — E785 Hyperlipidemia, unspecified: Secondary | ICD-10-CM

## 2014-07-13 DIAGNOSIS — Z7189 Other specified counseling: Secondary | ICD-10-CM

## 2014-07-13 DIAGNOSIS — Z0000 Encounter for general adult medical examination without abnormal findings: Secondary | ICD-10-CM | POA: Diagnosis not present

## 2014-07-13 DIAGNOSIS — D72819 Decreased white blood cell count, unspecified: Secondary | ICD-10-CM

## 2014-07-13 DIAGNOSIS — G40909 Epilepsy, unspecified, not intractable, without status epilepticus: Secondary | ICD-10-CM

## 2014-07-13 DIAGNOSIS — Z23 Encounter for immunization: Secondary | ICD-10-CM

## 2014-07-13 DIAGNOSIS — Q909 Down syndrome, unspecified: Secondary | ICD-10-CM

## 2014-07-13 DIAGNOSIS — E538 Deficiency of other specified B group vitamins: Secondary | ICD-10-CM

## 2014-07-13 MED ORDER — VITAMIN B-12 1000 MCG PO TABS: 1000.0000 ug | ORAL_TABLET | ORAL | Status: DC

## 2014-07-13 NOTE — Addendum Note (Signed)
Addended by: Sydell AxonLAWS, REGINA C on: 07/13/2014 04:12 PM   Modules accepted: Orders

## 2014-07-13 NOTE — Progress Notes (Signed)
Pre visit review using our clinic review tool, if applicable. No additional management support is needed unless otherwise documented below in the visit note. 

## 2014-07-13 NOTE — Assessment & Plan Note (Addendum)
B12 levels actually elevated. Recommended either 1000 mcg QOD or 500mg  daily.  Unclear if truly deficiency.

## 2014-07-13 NOTE — Progress Notes (Signed)
BP 104/62 mmHg  Pulse 76  Temp(Src) 97.6 F (36.4 C) (Oral)  Ht 5\' 6"  (1.676 m)  Wt 167 lb 8 oz (75.978 kg)  BMI 27.05 kg/m2   CC: medicare wellness  Subjective:    Patient ID: Kenneth [name], male    DOB: 1970/10/30, 43 y.o.   MRN: [id number]  HPI: Kenneth [name] is a 43 y.o. male presenting on 07/13/2014 for Annual Exam   Lives with sister/legal guardian "Kenneth [name]"  Working at [employer] in [place]  Here for follow up of Down's, seizure disorder, elevated cholesterol, and yearly physical for DSS.   [facility]  Denies anhedonia, sadness, depression No memory troubles  Preventative: Flu shot today Tdap 10/2013 Advanced directives: HCPOA is sister Kenneth [name] Seat belt use discussed  Sunscreen use discussed   Single Lives with his sister and her family, 5 dogs, 1 cat, 4 goats, 20 chickens [facility] [facility] Diet: good water, fruits/vegetables daily  Relevant past medical, surgical, family and social history reviewed and updated as indicated. Interim medical history since our last visit reviewed. Allergies and medications reviewed and updated.  Current Outpatient Prescriptions on File Prior to Visit  Medication Sig  . gabapentin (NEURONTIN) 100 MG capsule Take 200 mg by mouth 2 (two) times daily.    Marland Kitchen. PHENobarbital (LUMINAL) 64.8 MG tablet TAKE FOUR (4) TABLETS BY MOUTH AT BEDTIME  . pravastatin (PRAVACHOL) 20 MG tablet TAKE 1 TABLET BY MOUTH DAILY  . QUEtiapine (SEROQUEL) 300 MG tablet Take 600 mg by mouth at bedtime.   No current facility-administered medications on file prior to visit.    Review of Systems  Constitutional: Negative for fever, chills, activity change, appetite change, fatigue and unexpected weight change.  HENT: Negative for hearing loss.   Eyes: Negative for visual disturbance.  Respiratory: Negative for cough, chest tightness, shortness of breath and wheezing.     Cardiovascular: Negative for chest pain, palpitations and leg swelling.  Gastrointestinal: Negative for nausea, vomiting, abdominal pain, diarrhea, constipation, blood in stool and abdominal distention.  Genitourinary: Negative for hematuria and difficulty urinating.  Musculoskeletal: Negative for myalgias, arthralgias and neck pain.  Skin: Negative for rash.  Neurological: Negative for dizziness, seizures, syncope and headaches.  Hematological: Negative for adenopathy. Does not bruise/bleed easily.  Psychiatric/Behavioral: Negative for dysphoric mood. The patient is not nervous/anxious.    Per HPI unless specifically indicated above     Objective:    BP 104/62 mmHg  Pulse 76  Temp(Src) 97.6 F (36.4 C) (Oral)  Ht 5\' 6"  (1.676 m)  Wt 167 lb 8 oz (75.978 kg)  BMI 27.05 kg/m2  Wt Readings from Last 3 Encounters:  07/13/14 167 lb 8 oz (75.978 kg)  11/06/13 165 lb 8 oz (75.07 kg)  07/11/13 170 lb 8 oz (77.338 kg)    Physical Exam  Constitutional: He is oriented to person, place, and time. He appears well-developed and well-nourished. No distress.  HENT:  Head: Normocephalic and atraumatic.  Right Ear: Hearing, tympanic membrane, external ear and ear canal normal.  Left Ear: Hearing, tympanic membrane, external ear and ear canal normal.  Nose: Nose normal.  Mouth/Throat: Uvula is midline, oropharynx is clear and moist and mucous membranes are normal. No oropharyngeal exudate, posterior oropharyngeal edema or posterior oropharyngeal erythema.  Eyes: Conjunctivae and EOM are normal. Pupils are equal, round, and reactive to light. No scleral icterus.  Neck: Normal range of motion. Neck supple. No  thyromegaly present.  Cardiovascular: Normal rate, regular rhythm, normal heart sounds and intact distal pulses.   No murmur heard. Pulses:      Radial pulses are 2+ on the right side, and 2+ on the left side.  Pulmonary/Chest: Effort normal and breath sounds normal. No respiratory  distress. He has no wheezes. He has no rales.  Abdominal: Soft. Bowel sounds are normal. He exhibits no distension and no mass. There is no tenderness. There is no rebound and no guarding.  Ventral hernia present  Musculoskeletal: Normal range of motion. He exhibits no edema.  Lymphadenopathy:    He has no cervical adenopathy.  Neurological: He is alert and oriented to person, place, and time.  CN grossly intact, station and gait intact  Skin: Skin is warm and dry. No rash noted.  Psychiatric: He has a normal mood and affect. His behavior is normal. Judgment and thought content normal.  Nursing note and vitals reviewed.  Results for orders placed or performed in visit on 07/06/14  Lipid panel  Result Value Ref Range   Cholesterol 152 0 - 200 mg/dL   Triglycerides 161.0 0.0 - 149.0 mg/dL   HDL 96.04 >54.09 mg/dL   VLDL 81.1 0.0 - 91.4 mg/dL   LDL Cholesterol 81 0 - 99 mg/dL   Total CHOL/HDL Ratio 3    NonHDL 101.50   Comprehensive metabolic panel  Result Value Ref Range   Sodium 138 135 - 145 mEq/L   Potassium 3.9 3.5 - 5.1 mEq/L   Chloride 101 96 - 112 mEq/L   CO2 30 19 - 32 mEq/L   Glucose, Bld 98 70 - 99 mg/dL   BUN 13 6 - 23 mg/dL   Creatinine, Ser 1.3 0.4 - 1.5 mg/dL   Total Bilirubin 0.4 0.2 - 1.2 mg/dL   Alkaline Phosphatase 119 (H) 39 - 117 U/L   AST 17 0 - 37 U/L   ALT 13 0 - 53 U/L   Total Protein 7.1 6.0 - 8.3 g/dL   Albumin 3.5 3.5 - 5.2 g/dL   Calcium 8.8 8.4 - 78.2 mg/dL   GFR 95.62 >13.08 mL/min  CBC with Differential  Result Value Ref Range   WBC 3.5 (L) 4.0 - 10.5 K/uL   RBC 4.02 (L) 4.22 - 5.81 Mil/uL   Hemoglobin 13.5 13.0 - 17.0 g/dL   HCT 65.7 84.6 - 96.2 %   MCV 102.1 (H) 78.0 - 100.0 fl   MCHC 33.0 30.0 - 36.0 g/dL   RDW 95.2 84.1 - 32.4 %   Platelets 254.0 150.0 - 400.0 K/uL   Neutrophils Relative % 43.8 43.0 - 77.0 %   Lymphocytes Relative 37.7 12.0 - 46.0 %   Monocytes Relative 14.7 (H) 3.0 - 12.0 %   Eosinophils Relative 2.2 0.0 - 5.0 %    Basophils Relative 1.6 0.0 - 3.0 %   Neutro Abs 1.5 1.4 - 7.7 K/uL   Lymphs Abs 1.3 0.7 - 4.0 K/uL   Monocytes Absolute 0.5 0.1 - 1.0 K/uL   Eosinophils Absolute 0.1 0.0 - 0.7 K/uL   Basophils Absolute 0.1 0.0 - 0.1 K/uL  Phenobarbital level  Result Value Ref Range   Phenobarbital 39.4 15.0 - 40.0 mg/L  Vitamin B12  Result Value Ref Range   Vitamin B-12 915 (H) 211 - 911 pg/mL      Assessment & Plan:   Problem List Items Addressed This Visit    Vitamin B12 deficiency    B12 levels actually elevated. Recommended either  1000 mcg QOD or 500mg  daily.  Unclear if truly deficiency.    Seizure disorder    Chronic, stable on phenobarb with levels WNL.    Medicare annual wellness visit, subsequent - Primary    I have personally reviewed the Medicare Annual Wellness questionnaire and have noted 1. The patient's medical and social history 2. Their use of alcohol, tobacco or illicit drugs 3. Their current medications and supplements 4. The patient's functional ability including ADL's, fall risks, home safety risks and hearing or visual impairment. 5. Diet and physical activity 6. Evidence for depression or mood disorders The patients weight, height, BMI have been recorded in the chart.  Hearing and vision has been addressed. I have made referrals, counseling and provided education to the patient based review of the above and I have provided the pt with a written personalized care plan for preventive services. Provider list updated - see scanned questionairre. Reviewed preventative protocols and updated unless pt declined.     Leukopenia    ?med related (phenobarb, seroquel). Return for lab visit to recheck levels in 3-4 months.    HLD (hyperlipidemia)    FLP stable - continue pravastatin.    Down syndrome    Chronic, stable. No change in LOC needs. Denies memory changes.    Advanced care planning/counseling discussion    Advanced directives: HCPOA is sister Kenneth [name]         Follow up plan: Return in about 1 year (around 07/14/2015), or as needed, for follow up visit.

## 2014-07-13 NOTE — Assessment & Plan Note (Signed)
Chronic, stable. No change in LOC needs. Denies memory changes.

## 2014-07-13 NOTE — Assessment & Plan Note (Signed)
Advanced directives: HCPOA is sister Kenneth [name]

## 2014-07-13 NOTE — Assessment & Plan Note (Signed)

## 2014-07-13 NOTE — Patient Instructions (Addendum)
Kenneth BurdockRichard is looking well today. Flu shot today. Return in 1 year for next wellness visit with labs or as needed. Return in 4 months to recheck blood levels (lab visit only)

## 2014-07-13 NOTE — Assessment & Plan Note (Addendum)
?  med related (phenobarb, seroquel). Return for lab visit to recheck levels in 3-4 months.

## 2014-07-13 NOTE — Assessment & Plan Note (Signed)
Chronic, stable on phenobarb with levels WNL.

## 2014-07-13 NOTE — Assessment & Plan Note (Signed)
FLP stable - continue pravastatin.

## 2014-07-23 ENCOUNTER — Other Ambulatory Visit: Payer: Self-pay | Admitting: Family Medicine

## 2014-08-31 ENCOUNTER — Other Ambulatory Visit: Payer: Self-pay | Admitting: *Deleted

## 2014-08-31 NOTE — Telephone Encounter (Signed)
This should go through psych. I haven't filled this in the past?

## 2014-08-31 NOTE — Telephone Encounter (Signed)
Ok to refill 

## 2014-09-08 DIAGNOSIS — F72 Severe intellectual disabilities: Secondary | ICD-10-CM | POA: Diagnosis not present

## 2014-09-08 DIAGNOSIS — F332 Major depressive disorder, recurrent severe without psychotic features: Secondary | ICD-10-CM | POA: Diagnosis not present

## 2014-09-08 DIAGNOSIS — F918 Other conduct disorders: Secondary | ICD-10-CM | POA: Diagnosis not present

## 2014-09-28 ENCOUNTER — Other Ambulatory Visit: Payer: Self-pay | Admitting: Family Medicine

## 2014-11-10 ENCOUNTER — Other Ambulatory Visit: Payer: Self-pay | Admitting: Family Medicine

## 2014-11-10 DIAGNOSIS — D72819 Decreased white blood cell count, unspecified: Secondary | ICD-10-CM

## 2014-11-10 DIAGNOSIS — E785 Hyperlipidemia, unspecified: Secondary | ICD-10-CM

## 2014-11-12 ENCOUNTER — Other Ambulatory Visit (INDEPENDENT_AMBULATORY_CARE_PROVIDER_SITE_OTHER): Payer: Medicare Other

## 2014-11-12 DIAGNOSIS — E785 Hyperlipidemia, unspecified: Secondary | ICD-10-CM

## 2014-11-12 DIAGNOSIS — D72819 Decreased white blood cell count, unspecified: Secondary | ICD-10-CM | POA: Diagnosis not present

## 2014-11-13 LAB — CBC WITH DIFFERENTIAL/PLATELET
Basophils Absolute: 0.1 10*3/uL (ref 0.0–0.1)
Basophils Relative: 1.6 % (ref 0.0–3.0)
EOS PCT: 2.2 % (ref 0.0–5.0)
Eosinophils Absolute: 0.1 10*3/uL (ref 0.0–0.7)
HCT: 38.3 % — ABNORMAL LOW (ref 39.0–52.0)
Hemoglobin: 13 g/dL (ref 13.0–17.0)
LYMPHS ABS: 1.2 10*3/uL (ref 0.7–4.0)
Lymphocytes Relative: 37.1 % (ref 12.0–46.0)
MCHC: 34 g/dL (ref 30.0–36.0)
MCV: 100.5 fl — AB (ref 78.0–100.0)
MONO ABS: 0.4 10*3/uL (ref 0.1–1.0)
Monocytes Relative: 11.9 % (ref 3.0–12.0)
NEUTROS ABS: 1.5 10*3/uL (ref 1.4–7.7)
Neutrophils Relative %: 47.2 % (ref 43.0–77.0)
Platelets: 235 10*3/uL (ref 150.0–400.0)
RBC: 3.81 Mil/uL — AB (ref 4.22–5.81)
RDW: 15.2 % (ref 11.5–15.5)
WBC: 3.3 10*3/uL — ABNORMAL LOW (ref 4.0–10.5)

## 2014-11-13 LAB — TSH: TSH: 1.92 u[IU]/mL (ref 0.35–4.50)

## 2014-11-13 LAB — FOLATE: Folate: 7.1 ng/mL (ref >5.9)

## 2014-11-17 ENCOUNTER — Encounter: Payer: Self-pay | Admitting: *Deleted

## 2015-01-13 ENCOUNTER — Telehealth: Payer: Self-pay | Admitting: Family Medicine

## 2015-01-13 DIAGNOSIS — F72 Severe intellectual disabilities: Secondary | ICD-10-CM | POA: Diagnosis not present

## 2015-01-13 DIAGNOSIS — F918 Other conduct disorders: Secondary | ICD-10-CM | POA: Diagnosis not present

## 2015-01-13 NOTE — Telephone Encounter (Signed)
Message left for patient's sister to return my call.

## 2015-01-13 NOTE — Telephone Encounter (Signed)
Spoke with sister and she asked that I fax his recent labs from 12/15 and 4/16 to [facility] (psychiatrist) at [phone]. Labs faxed as requested.

## 2015-01-13 NOTE — Telephone Encounter (Signed)
Sister called in -  Please call back, needs to ask you a question and needs a copy of test results. Please call cell number thanks

## 2015-01-25 ENCOUNTER — Other Ambulatory Visit: Payer: Self-pay | Admitting: Family Medicine

## 2015-02-11 ENCOUNTER — Encounter (HOSPITAL_COMMUNITY): Payer: Self-pay | Admitting: Vascular Surgery

## 2015-02-11 ENCOUNTER — Emergency Department (HOSPITAL_COMMUNITY)
Admission: EM | Admit: 2015-02-11 | Discharge: 2015-02-12 | Disposition: A | Discharge status: Home / Self Care | Payer: Medicare Other | Attending: Emergency Medicine | Admitting: Emergency Medicine

## 2015-02-11 ENCOUNTER — Encounter (HOSPITAL_COMMUNITY): Payer: Self-pay | Admitting: *Deleted

## 2015-02-11 ENCOUNTER — Emergency Department (INDEPENDENT_AMBULATORY_CARE_PROVIDER_SITE_OTHER)
Admission: EM | Admit: 2015-02-11 | Discharge: 2015-02-11 | Disposition: A | Discharge status: Nursing Home (Includes ICF) | Payer: Medicare Other | Source: Home / Self Care | Attending: Family Medicine | Admitting: Family Medicine

## 2015-02-11 DIAGNOSIS — Q909 Down syndrome, unspecified: Secondary | ICD-10-CM | POA: Diagnosis not present

## 2015-02-11 DIAGNOSIS — Z79899 Other long term (current) drug therapy: Secondary | ICD-10-CM | POA: Insufficient documentation

## 2015-02-11 DIAGNOSIS — Y929 Unspecified place or not applicable: Secondary | ICD-10-CM | POA: Diagnosis not present

## 2015-02-11 DIAGNOSIS — R1084 Generalized abdominal pain: Secondary | ICD-10-CM

## 2015-02-11 DIAGNOSIS — R112 Nausea with vomiting, unspecified: Secondary | ICD-10-CM

## 2015-02-11 DIAGNOSIS — E785 Hyperlipidemia, unspecified: Secondary | ICD-10-CM | POA: Insufficient documentation

## 2015-02-11 DIAGNOSIS — T148XXA Other injury of unspecified body region, initial encounter: Secondary | ICD-10-CM

## 2015-02-11 DIAGNOSIS — R1110 Vomiting, unspecified: Secondary | ICD-10-CM | POA: Diagnosis not present

## 2015-02-11 DIAGNOSIS — G40909 Epilepsy, unspecified, not intractable, without status epilepticus: Secondary | ICD-10-CM | POA: Diagnosis not present

## 2015-02-11 DIAGNOSIS — Q249 Congenital malformation of heart, unspecified: Secondary | ICD-10-CM | POA: Diagnosis not present

## 2015-02-11 DIAGNOSIS — Y999 Unspecified external cause status: Secondary | ICD-10-CM | POA: Diagnosis not present

## 2015-02-11 DIAGNOSIS — Z880 Allergy status to penicillin: Secondary | ICD-10-CM | POA: Diagnosis not present

## 2015-02-11 DIAGNOSIS — Y939 Activity, unspecified: Secondary | ICD-10-CM | POA: Insufficient documentation

## 2015-02-11 DIAGNOSIS — R109 Unspecified abdominal pain: Secondary | ICD-10-CM | POA: Diagnosis not present

## 2015-02-11 DIAGNOSIS — X58XXXA Exposure to other specified factors, initial encounter: Secondary | ICD-10-CM | POA: Insufficient documentation

## 2015-02-11 DIAGNOSIS — H269 Unspecified cataract: Secondary | ICD-10-CM | POA: Diagnosis not present

## 2015-02-11 DIAGNOSIS — S40011A Contusion of right shoulder, initial encounter: Secondary | ICD-10-CM | POA: Diagnosis not present

## 2015-02-11 DIAGNOSIS — R58 Hemorrhage, not elsewhere classified: Secondary | ICD-10-CM

## 2015-02-11 DIAGNOSIS — R21 Rash and other nonspecific skin eruption: Secondary | ICD-10-CM | POA: Diagnosis not present

## 2015-02-11 HISTORY — DX: Congenital malformation of heart, unspecified: Q24.9

## 2015-02-11 MED ORDER — IOHEXOL 300 MG/ML  SOLN: 25.0000 mL | Freq: Once | INTRAMUSCULAR | Status: AC | PRN

## 2015-02-11 MED ORDER — ONDANSETRON 4 MG PO TBDP
4.0000 mg | ORAL_TABLET | Freq: Once | ORAL | Status: AC | PRN
  Administered 2015-02-11: 4 mg via ORAL

## 2015-02-11 MED ORDER — ONDANSETRON 4 MG PO TBDP
ORAL_TABLET | ORAL | Status: AC
  Filled 2015-02-11: qty 1

## 2015-02-11 NOTE — ED Notes (Signed)
Per sibling (caregiver): pt stated he didn't feel well this AM; was notified by daycare that he was heaving and noticed a lesion to left chest area.  Sibling states he has continued heaving throughout the day; noticed similar lesions to right shoulder and upper back region as well.

## 2015-02-11 NOTE — ED Provider Notes (Signed)
CSN: [id number]     Arrival date & time 02/11/15  2030 History  This chart was scribed for Devoria AlbeIva Kahron Kauth, MD by Evon Slackerrance Branch, ED Scribe. This patient was seen in room A08C/A08C and the patient's care was started at 11:24 PM.     Chief Complaint  Patient presents with  . Emesis  . Rash   The history is provided by the patient and a parent. No language interpreter was used.   HPI Comments: Kenneth [name] is a 44 y.o. male with PMHx of down syndrome, seizure disorder who presents to the Emergency Department with his legal guardian, his sister who also give his history, complaining of vomiting onset tonight. Pt presents with new rash on the left side of the neck, right shoulder, right chest and right upper back. Pt is complaining of shoulder pain and abdominal pain. Legal guardian states that this morning about 7 AM he stated that he was sick. She states that he has a tendency to say that he is sick and not really being sick. She states that his work called to say he was sick and that she went to pick him from work and at that time he was just spitting a lot. She states that the first time he vomited he had his fingers and his mouth and is unsure if he really vomited. She states that he did vomit just prior to coming to the ED as well.  Denies diarrhea, fever. Did not eat anything different. No one else has been sick. She is unaware of any accidents or falls.  PCP Dr Sharen HonesGutierrez  Past Medical History  Diagnosis Date  . Down syndrome   . Seizure disorder   . HLD (hyperlipidemia)   . Cataracts, bilateral   . Congenital heart defect    History reviewed. No pertinent past surgical history. Family History  Problem Relation Age of Onset  . Diabetes Mother   . Diabetes Father   . Hypertension Mother     Cerebral hemorrhage  . Hypertension Father   . Coronary artery disease Father [id number]    MI  . Stroke Father   . Cancer Maternal Grandmother     Oral  . Cancer Maternal Aunt     Colon   History   Substance Use Topics  . Smoking status: Never Smoker   . Smokeless tobacco: Never Used  . Alcohol Use: No  lives with sister who is his guardian  Review of Systems  Constitutional: Negative for fever.  Gastrointestinal: Positive for nausea, vomiting and abdominal pain. Negative for diarrhea.  Musculoskeletal: Positive for arthralgias.  All other systems reviewed and are negative.     Allergies  Amoxicillin-pot clavulanate and Penicillins  Home Medications   Prior to Admission medications   Medication Sig Start Date End Date Taking? Authorizing Provider  gabapentin (NEURONTIN) 100 MG capsule Take 200 mg by mouth 2 (two) times daily.     Yes Historical Provider, MD  PHENobarbital (LUMINAL) 64.8 MG tablet TAKE FOUR (4) TABLETS BY MOUTH AT BEDTIME 01/25/15  Yes Eustaquio BoydenJavier Gutierrez, MD  pravastatin (PRAVACHOL) 20 MG tablet TAKE 1 TABLET BY MOUTH DAILY 01/25/15  Yes Eustaquio BoydenJavier Gutierrez, MD  QUEtiapine (SEROQUEL) 300 MG tablet Take 600 mg by mouth at bedtime.   Yes Historical Provider, MD  vitamin B-12 (CYANOCOBALAMIN) 1000 MCG tablet Take 1 tablet (1,000 mcg total) by mouth every other day. 07/13/14  Yes Eustaquio BoydenJavier Gutierrez, MD   BP 112/83 mmHg  Pulse 78  Temp(Src) 99.4 F (37.4  C) (Oral)  Resp 18  SpO2 96%  Vital signs normal     Physical Exam  Constitutional: He is oriented to person, place, and time. He appears well-developed and well-nourished.  Non-toxic appearance. He does not appear ill. No distress.  Typical down faces.  appears mentally slow.   HENT:  Head: Normocephalic and atraumatic.  Right Ear: External ear normal.  Left Ear: External ear normal.  Nose: Nose normal. No mucosal edema or rhinorrhea.  Mouth/Throat: Mucous membranes are normal. No dental abscesses or uvula swelling.  Dry tongue.   Eyes: Conjunctivae and EOM are normal. Pupils are equal, round, and reactive to light.  Neck: Normal range of motion and full passive range of motion without pain. Neck supple.   Cardiovascular: Normal rate, regular rhythm and normal heart sounds.  Exam reveals no gallop and no friction rub.   No murmur heard. Pulmonary/Chest: Effort normal and breath sounds normal. No respiratory distress. He has no wheezes. He has no rhonchi. He has no rales. He exhibits no tenderness and no crepitus.  Abdominal: Soft. Normal appearance and bowel sounds are normal. He exhibits no distension. There is tenderness. There is no rebound and no guarding.  Tender diffusely without localization.   Musculoskeletal: Normal range of motion. He exhibits no edema or tenderness.  Moves all extremities well.   Neurological: He is alert and oriented to person, place, and time. He has normal strength. No cranial nerve deficit.  Skin: Skin is warm, dry and intact. Rash noted. No erythema. No pallor.  4 areas of petechial bruising. He states that the one on right shoulder is tender.   Psychiatric: He has a normal mood and affect. His speech is normal and behavior is normal. His mood appears not anxious.  Nursing note and vitals reviewed.          ED Course  Procedures (including critical care time)  Medications  ondansetron (ZOFRAN-ODT) 4 MG disintegrating tablet (not administered)  iohexol (OMNIPAQUE) 300 MG/ML solution 25 mL (not administered)  ondansetron (ZOFRAN-ODT) disintegrating tablet 4 mg (4 mg Oral Given 02/11/15 2050)  iohexol (OMNIPAQUE) 300 MG/ML solution 25 mL (25 mLs Oral Contrast Given 02/12/15 0110)  iohexol (OMNIPAQUE) 300 MG/ML solution 100 mL (80 mLs Intravenous Contrast Given 02/12/15 0055)  sodium chloride 0.9 % bolus 1,000 mL (0 mLs Intravenous Stopped 02/12/15 0325)    DIAGNOSTIC STUDIES: Oxygen Saturation is 95% on RA, adequate by my interpretation.    COORDINATION OF CARE: 11:53 PM-Discussed treatment plan with family at bedside and family agreed to plan.   Patient was given IV fluids and nausea medicine. Due to his underlying mental retardation CT scan was done  to look for significant intra-abdominal pathology.  At time of discharge patient is doing better however he wants to be admitted. His sister however wants him to go home. She states that a few years ago he had a acute urinary retention and had 3 L of fluid in his bladder. She states the urologist told her that his bladder wall would be thickened after that. His niece also states sometimes he punches things such as the wall. She is questioning whether he punched himself to cause the bruising.   Labs Review BASIC metabolic panel all normal except glucose 127  Lipase low at 16  CBC normal including normal platelet count  Urinalysis normal with small bilirubin and 15 ketones, no microscopic done  Imaging Review Ct Abdomen Pelvis W Contrast  02/12/2015   CLINICAL DATA:  44 year old male  with diffuse abdominal pain, and vomiting  EXAM: CT ABDOMEN AND PELVIS WITH CONTRAST  TECHNIQUE: Multidetector CT imaging of the abdomen and pelvis was performed using the standard protocol following bolus administration of intravenous contrast.  CONTRAST:  80mL OMNIPAQUE IOHEXOL 300 MG/ML SOLN, 25mL OMNIPAQUE IOHEXOL 300 MG/ML SOLN  COMPARISON:  CT dated 07/05/1999  FINDINGS: Evaluation of this exam is limited due to respiratory motion artifact. Evaluation is also limited due to streak artifact caused by patient's arms.  The visualized lung bases are clear. There is coronary vascular calcification. No intra-abdominal free air or free fluid.  The liver, gallbladder, pancreas, spleen, adrenal glands, kidneys, visualized ureters appear unremarkable. There is diffuse thickening of the bladder wall which Bordonaro be related to cystitis. An infiltrative process not excluded. Correlation with urinalysis and cystoscopy recommended. The prostate and seminal vesicles are grossly unremarkable. There is moderate stool throughout the colon. No evidence of bowel obstruction. The appendix is not visualized with certainty. No inflammatory  changes identified in the right lower quadrant.  Visualized abdominal aorta and IVC appear patent. No portal venous gas identified. There is no lymphadenopathy.  There is diastases of anterior abdominal wall musculature in the midline with a small fat containing umbilical hernia. There is mild diffuse subcutaneous soft tissue stranding and edema. Degenerative changes of the spine. No acute fracture.  IMPRESSION: Diffusely thickened bladder wall Wickliff be related to cystitis or an infiltrative process. Correlation with urinalysis and cystoscopy recommended.  No evidence of bowel obstruction or inflammation.   Electronically Signed   By: Elgie Collard M.D.   On: 02/12/2015 01:26     EKG Interpretation None      MDM   Final diagnoses:  Nausea and vomiting, vomiting of unspecified type  Abdominal pain, unspecified abdominal location  Bruise   New Prescriptions   ONDANSETRON (ZOFRAN ODT) 4 MG DISINTEGRATING TABLET    Take 1 tablet (4 mg total) by mouth every 8 (eight) hours as needed for nausea or vomiting.    Plan discharge  Devoria Albe, MD, FACEP    I personally performed the services described in this documentation, which was scribed in my presence. The recorded information has been reviewed and considered.  Devoria Albe, MD, Concha Pyo, MD 02/12/15 [phone]

## 2015-02-11 NOTE — ED Notes (Addendum)
Pt reports to the ED for eval of emesis, abd pain, and rash to bilateral shoulders/upper back. Rashes appear ecchymotic in nature. Unsure of any injury. Pt also has one episode of emesis and is continuously dry heaving. All of the symptoms began today. No hematemesis, oral swelling, or SOB. [facility] very tender. Pt has a hx of down syndrome and mental retardation. Pt alert and oriented at baseline. Resp e/u and skin warm and dry.

## 2015-02-11 NOTE — ED Notes (Signed)
Report called to Tammy SoursGreg, ED First Nurse.
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
## 2015-02-12 ENCOUNTER — Encounter (HOSPITAL_COMMUNITY): Payer: Self-pay

## 2015-02-12 ENCOUNTER — Emergency Department (HOSPITAL_COMMUNITY): Payer: Medicare Other

## 2015-02-12 DIAGNOSIS — R109 Unspecified abdominal pain: Secondary | ICD-10-CM | POA: Diagnosis not present

## 2015-02-12 DIAGNOSIS — R1110 Vomiting, unspecified: Secondary | ICD-10-CM | POA: Diagnosis not present

## 2015-02-12 DIAGNOSIS — R112 Nausea with vomiting, unspecified: Secondary | ICD-10-CM | POA: Diagnosis not present

## 2015-02-12 LAB — URINALYSIS, ROUTINE W REFLEX MICROSCOPIC
Glucose, UA: NEGATIVE mg/dL
HGB URINE DIPSTICK: NEGATIVE
KETONES UR: 15 mg/dL — AB
Leukocytes, UA: NEGATIVE
Nitrite: NEGATIVE
PH: 6 (ref 5.0–8.0)
Protein, ur: NEGATIVE mg/dL
Specific Gravity, Urine: 1.029 (ref 1.005–1.030)
Urobilinogen, UA: 0.2 mg/dL (ref 0.0–1.0)

## 2015-02-12 LAB — BASIC METABOLIC PANEL
ANION GAP: 11 (ref 5–15)
BUN: 12 mg/dL (ref 6–20)
CALCIUM: 9.4 mg/dL (ref 8.9–10.3)
CO2: 26 mmol/L (ref 22–32)
Chloride: 101 mmol/L (ref 101–111)
Creatinine, Ser: 1.19 mg/dL (ref 0.61–1.24)
GFR calc Af Amer: >60 mL/min (ref >60)
GFR calc non Af Amer: >60 mL/min (ref >60)
Glucose, Bld: 127 mg/dL — ABNORMAL HIGH (ref 65–99)
Potassium: 4.4 mmol/L (ref 3.5–5.1)
SODIUM: 138 mmol/L (ref 135–145)

## 2015-02-12 LAB — CBC
HCT: 42.7 % (ref 39.0–52.0)
HEMOGLOBIN: 14.8 g/dL (ref 13.0–17.0)
MCH: 34 pg (ref 26.0–34.0)
MCHC: 34.7 g/dL (ref 30.0–36.0)
MCV: 98.2 fL (ref 78.0–100.0)
Platelets: 304 10*3/uL (ref 150–400)
RBC: 4.35 MIL/uL (ref 4.22–5.81)
RDW: 14 % (ref 11.5–15.5)
WBC: 4.5 10*3/uL (ref 4.0–10.5)

## 2015-02-12 LAB — LIPASE, BLOOD: Lipase: 16 U/L — ABNORMAL LOW (ref 22–51)

## 2015-02-12 MED ORDER — SODIUM CHLORIDE 0.9 % IV BOLUS (SEPSIS)
1000.0000 mL | Freq: Once | INTRAVENOUS | Status: AC
  Administered 2015-02-12: 1000 mL via INTRAVENOUS

## 2015-02-12 MED ORDER — ONDANSETRON 4 MG PO TBDP: 4.0000 mg | ORAL_TABLET | Freq: Three times a day (TID) | ORAL | Status: DC | PRN

## 2015-02-12 MED ORDER — IOHEXOL 300 MG/ML  SOLN
100.0000 mL | Freq: Once | INTRAMUSCULAR | Status: AC | PRN
  Administered 2015-02-12: 80 mL via INTRAVENOUS

## 2015-02-12 MED ORDER — IOHEXOL 300 MG/ML  SOLN
25.0000 mL | Freq: Once | INTRAMUSCULAR | Status: AC | PRN
  Administered 2015-02-12: 25 mL via ORAL

## 2015-02-12 NOTE — Discharge Instructions (Signed)
Give him plenty of fluids to drink. Use the zofran for nausea or vomiting. Return to the ED for worsening symptoms such as uncontrolled vomiting, high fever, worsening abdominal pain.  The area on his skin appear to be bruises. Recheck if they seem worse.  Nausea and Vomiting Nausea is a sick feeling that often comes before throwing up (vomiting). Vomiting is a reflex where stomach contents come out of your mouth. Vomiting can cause severe loss of body fluids (dehydration). Children and elderly adults can become dehydrated quickly, especially if they also have diarrhea. Nausea and vomiting are symptoms of a condition or disease. It is important to find the cause of your symptoms. CAUSES   Direct irritation of the stomach lining. This irritation can result from increased acid production (gastroesophageal reflux disease), infection, food poisoning, taking certain medicines (such as nonsteroidal anti-inflammatory drugs), alcohol use, or tobacco use.  Signals from the brain.These signals could be caused by a headache, heat exposure, an inner ear disturbance, increased pressure in the brain from injury, infection, a tumor, or a concussion, pain, emotional stimulus, or metabolic problems.  An obstruction in the gastrointestinal tract (bowel obstruction).  Illnesses such as diabetes, hepatitis, gallbladder problems, appendicitis, kidney problems, cancer, sepsis, atypical symptoms of a heart attack, or eating disorders.  Medical treatments such as chemotherapy and radiation.  Receiving medicine that makes you sleep (general anesthetic) during surgery. DIAGNOSIS Your caregiver Casillas ask for tests to be done if the problems do not improve after a few days. Tests Byrd also be done if symptoms are severe or if the reason for the nausea and vomiting is not clear. Tests Machnik include:  Urine tests.  Blood tests.  Stool tests.  Cultures (to look for evidence of infection).  X-rays or other imaging  studies. Test results can help your caregiver make decisions about treatment or the need for additional tests. TREATMENT You need to stay well hydrated. Drink frequently but in small amounts.You Schanz wish to drink water, sports drinks, clear broth, or eat frozen ice pops or gelatin dessert to help stay hydrated.When you eat, eating slowly [place] help prevent nausea.There are also some antinausea medicines that Zillmer help prevent nausea. HOME CARE INSTRUCTIONS   Take all medicine as directed by your caregiver.  If you do not have an appetite, do not force yourself to eat. However, you must continue to drink fluids.  If you have an appetite, eat a normal diet unless your caregiver tells you differently.  Eat a variety of complex carbohydrates (rice, wheat, potatoes, bread), lean meats, yogurt, fruits, and vegetables.  Avoid high-fat foods because they are more difficult to digest.  Drink enough water and fluids to keep your urine clear or pale yellow.  If you are dehydrated, ask your caregiver for specific rehydration instructions. Signs of dehydration Drone include:  Severe thirst.  Dry lips and mouth.  Dizziness.  Dark urine.  Decreasing urine frequency and amount.  Confusion.  Rapid breathing or pulse. SEEK IMMEDIATE MEDICAL CARE IF:   You have blood or brown flecks (like coffee grounds) in your vomit.  You have black or bloody stools.  You have a severe headache or stiff neck.  You are confused.  You have severe abdominal pain.  You have chest pain or trouble breathing.  You do not urinate at least once every 8 hours.  You develop cold or clammy skin.  You continue to vomit for longer than 24 to 48 hours.  You have a fever. MAKE SURE  YOU:   Understand these instructions.  Will watch your condition.  Will get help right away if you are not doing well or get worse. Document Released: 07/17/2005 Document Revised: 10/09/2011 Document Reviewed:  12/14/2010 [facility] Patient Information 2015 [place], [place]. This information is not intended to replace advice given to you by your health care provider. Make sure you discuss any questions you have with your health care provider.

## 2015-02-12 NOTE — ED Notes (Signed)
Pt verbalized understanding of d/c instructions and has no further questions. Pt's caregiver given d/c instructions. Pt stable and in NAD.

## 2015-05-25 ENCOUNTER — Other Ambulatory Visit: Payer: Self-pay | Admitting: Family Medicine

## 2015-06-22 DIAGNOSIS — F72 Severe intellectual disabilities: Secondary | ICD-10-CM | POA: Diagnosis not present

## 2015-06-22 DIAGNOSIS — F918 Other conduct disorders: Secondary | ICD-10-CM | POA: Diagnosis not present

## 2015-07-15 ENCOUNTER — Other Ambulatory Visit (INDEPENDENT_AMBULATORY_CARE_PROVIDER_SITE_OTHER): Payer: Medicare Other

## 2015-07-15 ENCOUNTER — Other Ambulatory Visit: Payer: Self-pay | Admitting: Family Medicine

## 2015-07-15 DIAGNOSIS — E785 Hyperlipidemia, unspecified: Secondary | ICD-10-CM

## 2015-07-15 DIAGNOSIS — G40909 Epilepsy, unspecified, not intractable, without status epilepticus: Secondary | ICD-10-CM

## 2015-07-15 LAB — COMPREHENSIVE METABOLIC PANEL
ALBUMIN: 3.7 g/dL (ref 3.5–5.2)
ALT: 13 U/L (ref 0–53)
AST: 17 U/L (ref 0–37)
Alkaline Phosphatase: 127 U/L — ABNORMAL HIGH (ref 39–117)
BUN: 14 mg/dL (ref 6–23)
CO2: 32 mEq/L (ref 19–32)
CREATININE: 1.19 mg/dL (ref 0.40–1.50)
Calcium: 8.8 mg/dL (ref 8.4–10.5)
Chloride: 103 mEq/L (ref 96–112)
GFR: 70.56 mL/min (ref >60.00)
Glucose, Bld: 114 mg/dL — ABNORMAL HIGH (ref 70–99)
Potassium: 4.2 mEq/L (ref 3.5–5.1)
SODIUM: 138 meq/L (ref 135–145)
Total Bilirubin: 0.2 mg/dL (ref 0.2–1.2)
Total Protein: 7 g/dL (ref 6.0–8.3)

## 2015-07-15 LAB — LIPID PANEL
Cholesterol: 152 mg/dL (ref 0–200)
HDL: 49.6 mg/dL (ref >39.00)
LDL Cholesterol: 85 mg/dL (ref 0–99)
NonHDL: 101.99
TRIGLYCERIDES: 86 mg/dL (ref 0.0–149.0)
Total CHOL/HDL Ratio: 3
VLDL: 17.2 mg/dL (ref 0.0–40.0)

## 2015-07-16 LAB — PHENOBARBITAL LEVEL: Phenobarbital: 37.1 mg/L (ref 15.0–40.0)

## 2015-07-19 ENCOUNTER — Encounter: Payer: Medicare Other | Admitting: Family Medicine

## 2015-07-19 ENCOUNTER — Telehealth: Payer: Self-pay | Admitting: Family Medicine

## 2015-07-19 NOTE — Telephone Encounter (Signed)
Pt did not come in for their appt today for cpe. Please let me know if pt needs to be contacted immediately for follow up or no follow up needed. Best phone number to contact pt is [phone].

## 2015-07-19 NOTE — Telephone Encounter (Signed)
Patient came in at ~3:40 and was asked to reschedule.

## 2015-07-23 ENCOUNTER — Ambulatory Visit (INDEPENDENT_AMBULATORY_CARE_PROVIDER_SITE_OTHER): Payer: Medicare Other | Admitting: Family Medicine

## 2015-07-23 ENCOUNTER — Encounter: Payer: Self-pay | Admitting: Family Medicine

## 2015-07-23 VITALS — BP 106/60 | HR 67 | Temp 96.7°F | Wt 167.0 lb

## 2015-07-23 DIAGNOSIS — Z0000 Encounter for general adult medical examination without abnormal findings: Secondary | ICD-10-CM | POA: Diagnosis not present

## 2015-07-23 DIAGNOSIS — Z23 Encounter for immunization: Secondary | ICD-10-CM

## 2015-07-23 DIAGNOSIS — Q909 Down syndrome, unspecified: Secondary | ICD-10-CM | POA: Diagnosis not present

## 2015-07-23 DIAGNOSIS — G40909 Epilepsy, unspecified, not intractable, without status epilepticus: Secondary | ICD-10-CM | POA: Diagnosis not present

## 2015-07-23 DIAGNOSIS — E785 Hyperlipidemia, unspecified: Secondary | ICD-10-CM

## 2015-07-23 DIAGNOSIS — E538 Deficiency of other specified B group vitamins: Secondary | ICD-10-CM

## 2015-07-23 DIAGNOSIS — Z7189 Other specified counseling: Secondary | ICD-10-CM

## 2015-07-23 MED ORDER — PRAVASTATIN SODIUM 10 MG PO TABS: 10.0000 mg | ORAL_TABLET | Freq: Every day | ORAL | Status: DC

## 2015-07-23 NOTE — Patient Instructions (Addendum)
Ok to change B12 to 500mcg daily. Decrease pravastatin to 10mg  daily. Ok to start next month. New prescription at your pharmacy.  Flu shot today. Urinalysis today. Advanced directive packet provided today. Return as needed or in 1 year for next physical.

## 2015-07-23 NOTE — Assessment & Plan Note (Signed)
Chronic, stable. No LOC needs. No memory changes noted. [facility] going [facility] [employer]

## 2015-07-23 NOTE — Assessment & Plan Note (Signed)

## 2015-07-23 NOTE — Assessment & Plan Note (Signed)
Lower b12 to daily.

## 2015-07-23 NOTE — Addendum Note (Signed)
Addended by: Cleda ClarksIPLEY, MONICA B on: 07/23/2015 12:39 PM   Modules accepted: Orders

## 2015-07-23 NOTE — Assessment & Plan Note (Signed)
Chronic, stable. No recent seizures. Continue phenobarb, levels in therapeutic range.

## 2015-07-23 NOTE — Progress Notes (Signed)
BP 106/60 mmHg  Pulse 67  Temp(Src) 96.7 F (35.9 C) (Oral)  Wt 167 lb (75.751 kg)  SpO2 98%   CC: medicare wellness visit  Subjective:    Patient ID: Kenneth [name], male    DOB: [birth year], 44 y.o.   MRN: [id number]  HPI: Kenneth [name] is a 44 y.o. male presenting on 07/23/2015 for Medicare Wellness   Seen at ER over this past summer with abd pain, nausea/vomiting - also with bruising - investigation underway.   Lives with sister/legal guardian "[name]"  Working at [employer] [place]  Here for follow up of Down's, seizure disorder, elevated cholesterol, and yearly physical for DSS.   Sees Silvia psychologist and Advertising copy[facility]Linda [facility]   Passes hearing screen today.  No falls in the past year. Denies anhedonia, sadness, depression.  No memory troubles.   Preventative: Flu shot today  Tdap 10/2013  Advanced directives: HCPOA is sister Kenneth [name].  Seat belt use discussed  Sunscreen use discussed   Single Lives with his sister and her family, 5 dogs, 1 cat, 4 goats, 20 chickens [facility] Diet: good water, fruits/vegetables daily  Relevant past medical, surgical, family and social history reviewed and updated as indicated. Interim medical history since our last visit reviewed. Allergies and medications reviewed and updated. Current Outpatient Prescriptions on File Prior to Visit  Medication Sig  . gabapentin (NEURONTIN) 100 MG capsule Take 200 mg by mouth 2 (two) times daily.    Marland Kitchen. PHENobarbital (LUMINAL) 64.8 MG tablet TAKE FOUR (4) TABLETS BY MOUTH AT BEDTIME  . QUEtiapine (SEROQUEL) 300 MG tablet Take 600 mg by mouth at bedtime.   No current facility-administered medications on file prior to visit.    Review of Systems Per HPI unless specifically indicated in ROS section     Objective:    BP 106/60 mmHg  Pulse 67  Temp(Src) 96.7 F (35.9 C) (Oral)  Wt 167 lb (75.751 kg)  SpO2 98%  Wt  Readings from Last 3 Encounters:  07/23/15 167 lb (75.751 kg)  07/13/14 167 lb 8 oz (75.978 kg)  11/06/13 165 lb 8 oz (75.07 kg)   Body mass index is 26.97 kg/(m^2).  Physical Exam  Constitutional: He is oriented to person, place, and time. He appears well-developed and well-nourished. No distress.  HENT:  Head: Normocephalic and atraumatic.  Right Ear: Hearing, tympanic membrane, external ear and ear canal normal.  Left Ear: Hearing, tympanic membrane, external ear and ear canal normal.  Nose: Nose normal.  Mouth/Throat: Uvula is midline, oropharynx is clear and moist and mucous membranes are normal. No oropharyngeal exudate, posterior oropharyngeal edema or posterior oropharyngeal erythema.  Eyes: Conjunctivae and EOM are normal. Pupils are equal, round, and reactive to light. No scleral icterus.  Neck: Normal range of motion. Neck supple. Carotid bruit is not present. No thyromegaly present.  Cardiovascular: Normal rate, regular rhythm, normal heart sounds and intact distal pulses.   No murmur heard. Pulses:      Radial pulses are 2+ on the right side, and 2+ on the left side.  Pulmonary/Chest: Effort normal and breath sounds normal. No respiratory distress. He has no wheezes. He has no rales.  Abdominal: Soft. Bowel sounds are normal. He exhibits no distension and no mass. There is no tenderness. There is no rebound and no guarding.  Small ventral hernia present  Musculoskeletal: Normal range of motion. He exhibits no edema.  Lymphadenopathy:  He has no cervical adenopathy.  Neurological: He is alert and oriented to person, place, and time.  CN grossly intact, station and gait intact  Skin: Skin is warm and dry. No rash noted.  Psychiatric: He has a normal mood and affect. His behavior is normal. Judgment and thought content normal.  Nursing note and vitals reviewed.  Results for orders placed or performed in visit on 07/15/15  Lipid panel  Result Value Ref Range   Cholesterol  152 0 - 200 mg/dL   Triglycerides 40.9 0.0 - 149.0 mg/dL   HDL 81.19 >14.78 mg/dL   VLDL 29.5 0.0 - 62.1 mg/dL   LDL Cholesterol 85 0 - 99 mg/dL   Total CHOL/HDL Ratio 3    NonHDL 101.99   Comprehensive metabolic panel  Result Value Ref Range   Sodium 138 135 - 145 mEq/L   Potassium 4.2 3.5 - 5.1 mEq/L   Chloride 103 96 - 112 mEq/L   CO2 32 19 - 32 mEq/L   Glucose, Bld 114 (H) 70 - 99 mg/dL   BUN 14 6 - 23 mg/dL   Creatinine, Ser 3.08 0.40 - 1.50 mg/dL   Total Bilirubin 0.2 0.2 - 1.2 mg/dL   Alkaline Phosphatase 127 (H) 39 - 117 U/L   AST 17 0 - 37 U/L   ALT 13 0 - 53 U/L   Total Protein 7.0 6.0 - 8.3 g/dL   Albumin 3.7 3.5 - 5.2 g/dL   Calcium 8.8 8.4 - 65.7 mg/dL   GFR 84.69 >62.95 mL/min  Phenobarbital level  Result Value Ref Range   Phenobarbital 37.1 15.0 - 40.0 mg/L   Lab Results  Component Value Date   VITAMINB12 915* 07/06/2014   Ct Abdomen Pelvis W Contrast  02/12/2015 CLINICAL DATA: 44 year old male with diffuse abdominal pain, and vomiting EXAM: CT ABDOMEN AND PELVIS WITH CONTRAST TECHNIQUE: Multidetector CT imaging of the abdomen and pelvis was performed using the standard protocol following bolus administration of intravenous contrast. CONTRAST: 80mL OMNIPAQUE IOHEXOL 300 MG/ML SOLN, 25mL OMNIPAQUE IOHEXOL 300 MG/ML SOLN COMPARISON: CT dated 07/05/1999 FINDINGS: Evaluation of this exam is limited due to respiratory motion artifact. Evaluation is also limited due to streak artifact caused by patient's arms. The visualized lung bases are clear. There is coronary vascular calcification. No intra-abdominal free air or free fluid. The liver, gallbladder, pancreas, spleen, adrenal glands, kidneys, visualized ureters appear unremarkable. There is diffuse thickening of the bladder wall which Badami be related to cystitis. An infiltrative process not excluded. Correlation with urinalysis and cystoscopy recommended. The prostate and seminal vesicles are grossly  unremarkable. There is moderate stool throughout the colon. No evidence of bowel obstruction. The appendix is not visualized with certainty. No inflammatory changes identified in the right lower quadrant. Visualized abdominal aorta and IVC appear patent. No portal venous gas identified. There is no lymphadenopathy. There is diastases of anterior abdominal wall musculature in the midline with a small fat containing umbilical hernia. There is mild diffuse subcutaneous soft tissue stranding and edema. Degenerative changes of the spine. No acute fracture. IMPRESSION: Diffusely thickened bladder wall Chirino be related to cystitis or an infiltrative process. Correlation with urinalysis and cystoscopy recommended. No evidence of bowel obstruction or inflammation. Electronically Signed By: Elgie Collard M.D. On: 02/12/2015 01:26     Assessment & Plan:   Problem List Items Addressed This Visit    Vitamin B12 deficiency    Lower b12 to daily.       Seizure disorder (HCC)  Chronic, stable. No recent seizures. Continue phenobarb, levels in therapeutic range.      Medicare annual wellness visit, subsequent - Primary    I have personally reviewed the Medicare Annual Wellness questionnaire and have noted 1. The patient's medical and social history 2. Their use of alcohol, tobacco or illicit drugs 3. Their current medications and supplements 4. The patient's functional ability including ADL's, fall risks, home safety risks and hearing or visual impairment. Cognitive function has been assessed and addressed as indicated.  5. Diet and physical activity 6. Evidence for depression or mood disorders The patients weight, height, BMI have been recorded in the chart. I have made referrals, counseling and provided education to the patient based on review of the above and I have provided the pt with a written personalized care plan for preventive services. Provider list updated.. See scanned  questionairre as needed for further documentation. Reviewed preventative protocols and updated unless pt declined.       HLD (hyperlipidemia)    FLP stable. Decrease pravastatin to  daily.      Relevant Medications   pravastatin (PRAVACHOL) 10 MG tablet   Down syndrome    Chronic, stable. No LOC needs. No memory changes noted. Continues going to [employer]      Advanced care planning/counseling discussion    Advanced directives: HCPOA is sister Kenneth Pollack. Packet provided today.          Follow up plan: Return in about 1 year (around 07/22/2016), or as needed, for medicare wellness visit.

## 2015-07-23 NOTE — Assessment & Plan Note (Signed)
FLP stable. Decrease pravastatin to 10mg  daily.

## 2015-07-23 NOTE — Assessment & Plan Note (Addendum)
Advanced directives: HCPOA is sister Adolph PollackKimberly Phelps. Packet provided today.

## 2015-08-19 ENCOUNTER — Other Ambulatory Visit: Payer: Self-pay | Admitting: Family Medicine

## 2015-12-07 DIAGNOSIS — F918 Other conduct disorders: Secondary | ICD-10-CM | POA: Diagnosis not present

## 2015-12-07 DIAGNOSIS — F72 Severe intellectual disabilities: Secondary | ICD-10-CM | POA: Diagnosis not present

## 2015-12-12 ENCOUNTER — Emergency Department (HOSPITAL_COMMUNITY): Payer: Medicare Other

## 2015-12-12 ENCOUNTER — Emergency Department (HOSPITAL_COMMUNITY)
Admission: EM | Admit: 2015-12-12 | Discharge: 2015-12-12 | Disposition: A | Discharge status: Home / Self Care | Payer: Medicare Other | Attending: Emergency Medicine | Admitting: Emergency Medicine

## 2015-12-12 ENCOUNTER — Encounter (HOSPITAL_COMMUNITY): Payer: Self-pay | Admitting: Emergency Medicine

## 2015-12-12 DIAGNOSIS — E785 Hyperlipidemia, unspecified: Secondary | ICD-10-CM | POA: Insufficient documentation

## 2015-12-12 DIAGNOSIS — Q909 Down syndrome, unspecified: Secondary | ICD-10-CM | POA: Diagnosis not present

## 2015-12-12 DIAGNOSIS — R930 Abnormal findings on diagnostic imaging of skull and head, not elsewhere classified: Secondary | ICD-10-CM | POA: Diagnosis not present

## 2015-12-12 DIAGNOSIS — Z79899 Other long term (current) drug therapy: Secondary | ICD-10-CM | POA: Diagnosis not present

## 2015-12-12 DIAGNOSIS — R569 Unspecified convulsions: Secondary | ICD-10-CM

## 2015-12-12 DIAGNOSIS — Z792 Long term (current) use of antibiotics: Secondary | ICD-10-CM | POA: Insufficient documentation

## 2015-12-12 DIAGNOSIS — G40909 Epilepsy, unspecified, not intractable, without status epilepticus: Secondary | ICD-10-CM | POA: Insufficient documentation

## 2015-12-12 DIAGNOSIS — R0902 Hypoxemia: Secondary | ICD-10-CM | POA: Diagnosis not present

## 2015-12-12 DIAGNOSIS — R112 Nausea with vomiting, unspecified: Secondary | ICD-10-CM | POA: Diagnosis not present

## 2015-12-12 LAB — CBC WITH DIFFERENTIAL/PLATELET
Basophils Absolute: 0 10*3/uL (ref 0.0–0.1)
Basophils Relative: 0 %
EOS PCT: 0 %
Eosinophils Absolute: 0 10*3/uL (ref 0.0–0.7)
HCT: 42.1 % (ref 39.0–52.0)
Hemoglobin: 14.2 g/dL (ref 13.0–17.0)
Lymphocytes Relative: 6 %
Lymphs Abs: 0.6 10*3/uL — ABNORMAL LOW (ref 0.7–4.0)
MCH: 33.7 pg (ref 26.0–34.0)
MCHC: 33.7 g/dL (ref 30.0–36.0)
MCV: 100 fL (ref 78.0–100.0)
Monocytes Absolute: 0.6 10*3/uL (ref 0.1–1.0)
Monocytes Relative: 6 %
Neutro Abs: 9.6 10*3/uL — ABNORMAL HIGH (ref 1.7–7.7)
Neutrophils Relative %: 88 %
PLATELETS: 264 10*3/uL (ref 150–400)
RBC: 4.21 MIL/uL — ABNORMAL LOW (ref 4.22–5.81)
RDW: 14.7 % (ref 11.5–15.5)
WBC: 10.9 10*3/uL — ABNORMAL HIGH (ref 4.0–10.5)

## 2015-12-12 LAB — BASIC METABOLIC PANEL
Anion gap: 9 (ref 5–15)
BUN: 18 mg/dL (ref 6–20)
CO2: 28 mmol/L (ref 22–32)
Calcium: 8.9 mg/dL (ref 8.9–10.3)
Chloride: 101 mmol/L (ref 101–111)
Creatinine, Ser: 1.3 mg/dL — ABNORMAL HIGH (ref 0.61–1.24)
GFR calc Af Amer: >60 mL/min (ref >60)
GFR calc non Af Amer: >60 mL/min (ref >60)
GLUCOSE: 117 mg/dL — AB (ref 65–99)
POTASSIUM: 5.1 mmol/L (ref 3.5–5.1)
Sodium: 138 mmol/L (ref 135–145)

## 2015-12-12 LAB — BRAIN NATRIURETIC PEPTIDE: B Natriuretic Peptide: 13.6 pg/mL (ref 0.0–100.0)

## 2015-12-12 LAB — PHENOBARBITAL LEVEL: Phenobarbital: 40.4 ug/mL — ABNORMAL HIGH (ref 15.0–40.0)

## 2015-12-12 LAB — I-STAT CG4 LACTIC ACID, ED: Lactic Acid, Venous: 1.57 mmol/L (ref 0.5–2.0)

## 2015-12-12 MED ORDER — DOXYCYCLINE HYCLATE 100 MG PO TABS
100.0000 mg | ORAL_TABLET | Freq: Once | ORAL | Status: AC
  Administered 2015-12-12: 100 mg via ORAL
  Filled 2015-12-12: qty 1

## 2015-12-12 MED ORDER — LORAZEPAM 2 MG/ML IJ SOLN
1.0000 mg | Freq: Once | INTRAMUSCULAR | Status: AC
  Administered 2015-12-12: 1 mg via INTRAVENOUS
  Filled 2015-12-12: qty 1

## 2015-12-12 MED ORDER — DOXYCYCLINE HYCLATE 100 MG PO CAPS: 100.0000 mg | ORAL_CAPSULE | Freq: Two times a day (BID) | ORAL | Status: DC

## 2015-12-12 MED ORDER — SODIUM CHLORIDE 0.9 % IV BOLUS (SEPSIS)
500.0000 mL | Freq: Once | INTRAVENOUS | Status: AC
  Administered 2015-12-12: 500 mL via INTRAVENOUS

## 2015-12-12 MED ORDER — ONDANSETRON HCL 4 MG/2ML IJ SOLN
4.0000 mg | Freq: Once | INTRAMUSCULAR | Status: AC
  Administered 2015-12-12: 4 mg via INTRAVENOUS
  Filled 2015-12-12: qty 2

## 2015-12-12 MED ORDER — SODIUM CHLORIDE 0.9 % IV SOLN
INTRAVENOUS | Status: DC
  Administered 2015-12-12: 13:00:00 via INTRAVENOUS

## 2015-12-12 NOTE — ED Provider Notes (Signed)
CSN: [id number]     Arrival date & time 12/12/15  1138 History   First MD Initiated Contact with Patient 12/12/15 1219     Chief Complaint  Patient presents with  . Seizures     (Consider location/radiation/quality/duration/timing/severity/associated sxs/prior Treatment) HPI   Kenneth [name] is a 45 y.o. male who presents for evaluation of altered mental status, followed by some jerking. Patient was with his mother, sitting in a car when he suddenly became unresponsive and had a "blank stare." She called EMS, who arrived and found him alert and responsive. I transferred him here, for evaluation and during transport, noticed some jerking so they gave him Versed. On arrival, the patient was retching, and vomiting, some tan-colored fluid. Patient alert and responsive on arrival but unable to communicate.   Level V caveat- altered mental status   Past Medical History  Diagnosis Date  . Down syndrome   . Seizure disorder (HCC)   . HLD (hyperlipidemia)   . Cataracts, bilateral   . Congenital heart defect    History reviewed. No pertinent past surgical history. Family History  Problem Relation Age of Onset  . Diabetes Mother   . Diabetes Father   . Hypertension Mother     Cerebral hemorrhage  . Hypertension Father   . Coronary artery disease Father 23    MI  . Stroke Father   . Cancer Maternal Grandmother     Oral  . Cancer Maternal Aunt     Colon   Social History  Substance Use Topics  . Smoking status: Never Smoker   . Smokeless tobacco: Never Used  . Alcohol Use: No    Review of Systems  Unable to perform ROS: Mental status change      Allergies  Amoxicillin-pot clavulanate and Penicillins  Home Medications   Prior to Admission medications   Medication Sig Start Date End Date Taking? Authorizing Provider  gabapentin (NEURONTIN) 100 MG capsule Take 200 mg by mouth 2 (two) times daily.     Yes Historical Provider, MD  PHENobarbital (LUMINAL) 64.8 MG tablet TAKE  FOUR (4) TABLETS BY MOUTH AT BEDTIME 08/19/15  Yes Kenneth Boyden, MD  pravastatin (PRAVACHOL) 10 MG tablet Take 1 tablet (10 mg total) by mouth daily. 07/23/15  Yes Kenneth Boyden, MD  QUEtiapine (SEROQUEL) 300 MG tablet Take 600 mg by mouth at bedtime.   Yes Historical Provider, MD  vitamin B-12 (CYANOCOBALAMIN) 500 MCG tablet Take 500 mcg by mouth daily.   Yes Historical Provider, MD  doxycycline (VIBRAMYCIN) 100 MG capsule Take 1 capsule (100 mg total) by mouth 2 (two) times daily. 12/12/15   Mancel Bale, MD   BP 107/77 mmHg  Pulse 89  Temp(Src) 98.2 F (36.8 C) (Axillary)  Resp 18  SpO2 97% Physical Exam  Constitutional: He appears well-developed. He appears distressed (Uncomfortable).  Appears older than stated age  HENT:  Head: Normocephalic and atraumatic.  Right Ear: External ear normal.  Left Ear: External ear normal.  Tan-colored fluid on lips and chin. Patient will not open his mouth on command.  Eyes: Conjunctivae and EOM are normal. Pupils are equal, round, and reactive to light.  Neck: Normal range of motion and phonation normal. Neck supple.  Cardiovascular: Normal rate, regular rhythm and normal heart sounds.   Pulmonary/Chest: Effort normal and breath sounds normal. He exhibits no bony tenderness.  Abdominal: Soft. He exhibits no distension. There is no tenderness.  Musculoskeletal: Normal range of motion.  Neurological: He is alert. No  cranial nerve deficit or sensory deficit. He exhibits normal muscle tone. Coordination normal.  Skin: Skin is warm, dry and intact.  Psychiatric: He has a normal mood and affect.  Nursing note and vitals reviewed.   ED Course  Procedures (including critical care time)  Initial treatment- patient was given a suction device and place it to his lips, and chin to clear the secretions. He did this in a purposeful fashion and seemed to understand what he was doing. He was then given a cough, and wiped his face.  Initial Clinical  Impression- possible seizure with vomiting. We'll check phenobarbital level, electrolytes, and reassess.  12:30- . Oxygen saturation dropped from 92% to 88% on room air. He was placed on nasal cannula oxygen with improvement. Will add additional evaluation for possible aspiration pneumonia versus pulmonary disorder.  13:00- patient's caregiver, arrived and states that he was sitting in the car became suddenly unresponsive and she felt that he was not breathing. At that point she called EMS, who recommended that she remove them from the car and begin cardiac compressions. The caregiver did that, and they were continued until EMS arrived, whereupon they found him alert. And chest compressions were discontinued.  Medications  0.9 %  sodium chloride infusion ( Intravenous New Bag/Given 12/12/15 1246)  doxycycline (VIBRA-TABS) tablet 100 mg (not administered)  LORazepam (ATIVAN) injection 1 mg (1 mg Intravenous Given 12/12/15 1223)  ondansetron (ZOFRAN) injection 4 mg (4 mg Intravenous Given 12/12/15 1223)  sodium chloride 0.9 % bolus 500 mL (0 mLs Intravenous Stopped 12/12/15 1515)  sodium chloride 0.9 % bolus 500 mL (0 mLs Intravenous Stopped 12/12/15 1515)    Patient Vitals for the past 24 hrs:  BP Temp Temp src Pulse Resp SpO2  12/12/15 1548 - - - 89 18 97 %  12/12/15 1542 - - - 80 18 97 %  12/12/15 1530 - - - 85 16 96 %  12/12/15 1524 - - - 87 20 97 %  12/12/15 1518 - - - 80 14 98 %  12/12/15 1512 - - - 81 20 97 %  12/12/15 1506 107/77 mmHg 98.2 F (36.8 C) Axillary 82 14 97 %  12/12/15 1150 119/72 mmHg 98.1 F (36.7 C) Axillary 87 18 92 %  12/12/15 1143 - - - - - 92 %    4:51 PM Reevaluation with update and discussion. After initial assessment and treatment, an updated evaluation reveals he is alert, sitting up, still on oxygen. He has been able tolerate oral liquids and we will trial some food. 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 Kenneth [name] L   17:40- room air oxygen 92%. He is tolerating eating without vomiting, or discomfort. Updated on findings. All questions answered.  CRITICAL CARE Performed by: Flint Melter Total critical care time: 35 minutes Critical care time was exclusive of separately billable procedures and treating other patients. Critical care was necessary to treat or prevent imminent or life-threatening deterioration. Critical care was time spent personally by me on the following activities: development of treatment plan with patient and/or surrogate as well as nursing, discussions with consultants, evaluation of patient's response to treatment, examination of patient, obtaining history from patient or surrogate, ordering and performing treatments and interventions, ordering and review of laboratory studies, ordering and review of radiographic studies, pulse oximetry and re-evaluation of patient's condition.   Labs Review Labs Reviewed  BASIC METABOLIC PANEL -  Abnormal; Notable for the following:    Glucose, Bld 117 (*)    Creatinine, Ser 1.30 (*)    All other components within normal limits  CBC WITH DIFFERENTIAL/PLATELET - Abnormal; Notable for the following:    WBC 10.9 (*)    RBC 4.21 (*)    Neutro Abs 9.6 (*)    Lymphs Abs 0.6 (*)    All other components within normal limits  PHENOBARBITAL LEVEL - Abnormal; Notable for the following:    Phenobarbital 40.4 (*)    All other components within normal limits  CULTURE, BLOOD (ROUTINE X 2)  CULTURE, BLOOD (ROUTINE X 2)  BRAIN NATRIURETIC PEPTIDE  I-STAT CG4 LACTIC ACID, ED    Imaging Review Ct Head Wo Contrast  12/12/2015  CLINICAL DATA:  Seizure today.  Down syndrome. EXAM: CT HEAD WITHOUT CONTRAST TECHNIQUE: Contiguous axial images were obtained from the base of the skull through the vertex without intravenous contrast. COMPARISON:  None. FINDINGS: The brain does not show atrophy. There is no  evidence of acute focal infarction, mass lesion, hemorrhage, hydrocephalus or extra-axial collection. 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 Incidental cavum septum pellucidum. No calvarial abnormality. Chronic opacification of the right maxillary sinus with volume loss. 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 Incidental cavum septum pellucidum. No acute finding. Right maxillary sinus opacification with volume loss. Electronically Signed   By: Paulina FusiMark  Shogry M.D.   On: 12/12/2015 14:57   Dg Chest Port 1 View  12/12/2015  CLINICAL DATA:  Hypoxemia EXAM: PORTABLE CHEST 1 VIEW COMPARISON:  10/20/2012 FINDINGS: Cardiac enlargement. Diffuse bilateral airspace disease is symmetric. Most likely this is edema. No effusion. IMPRESSION: Diffuse bilateral airspace disease probable pulmonary edema. Pulmonary infection not excluded. Electronically Signed   By: Marlan Palauharles  Clark M.D.   On: 12/12/2015 13:58   I have personally reviewed and evaluated these images and lab results as part of my medical decision-making.   EKG Interpretation   Date/Time:  Sunday Hetz 14 2017 16:27:23 EDT Ventricular Rate:  79 PR Interval:  171 QRS Duration: 93 QT Interval:  372 QTC Calculation: 426 R Axis:   13 Text Interpretation:  Sinus rhythm since last tracing no significant  change Confirmed by Tekisha Darcey  MD, Mechele CollinELLIOTT ([id number]) on 12/12/2015 4:52:04 PM      MDM   Final diagnoses:  Seizure (HCC)  Non-intractable vomiting with nausea, vomiting of unspecified type    Seizure with known seizure disorder, on phenobarbital, with slightly elevated phenobarbital level. Patient has not had a seizure for a long time. No indication for changing seizure medicines at this time. He is status post a short period of chest compression following the seizure, when he was not responding normally. He presented with vomiting, and Konitzer have aspirated, accounting for  the abnormal appearance of the chest x-ray.  Nursing Notes Reviewed/ Care Coordinated Applicable Imaging Reviewed Interpretation of Laboratory Data incorporated into ED treatment  The patient appears reasonably screened and/or stabilized for discharge and I doubt any other medical condition or other [facility] requiring further screening, evaluation, or treatment in the ED at this time prior to discharge.  Plan: Home Medications- doxycycline; Home Treatments- push fluids and eating.; return here if the recommended treatment, does not improve the symptoms; Recommended follow up- patient recheck in 2 or 3 days. Return here if needed.   Mancel BaleElliott Malikai Gut, MD 12/12/15 [phone]

## 2015-12-12 NOTE — ED Notes (Signed)
[place]. Pt mother reports pt was unresponsive. Upon EMS arrival, pt was awake and responding. Pt reports abd pain. Pt received 4 Zofran en route. Pt continued to have jerking motions. Pt was given 2.5 versed that helped with jerking. Pt mother adds that pt has hx of Down's syndrome and seizures but has not had a seizure in 8-10 years. Pt takes phenobarbital daily. Pt had meds today. Pt in NAD

## 2015-12-12 NOTE — Discharge Instructions (Signed)
Drink plenty of fluids, and eat 3 regular meals each day. Return here if needed, for problems. Watch for fever, trouble breathing or other concerns.  Seizure, Adult A seizure is abnormal electrical activity in the brain. Seizures usually last from 30 seconds to 2 minutes. There are various types of seizures. Before a seizure, you Eyman have a warning sensation (aura) that a seizure is about to occur. An aura Kassin include the following symptoms:   Fear or anxiety.  Nausea.  Feeling like the room is spinning (vertigo).  Vision changes, such as seeing flashing lights or spots. Common symptoms during a seizure include:  A change in attention or behavior (altered mental status).  Convulsions with rhythmic jerking movements.  Drooling.  Rapid eye movements.  Grunting.  Loss of bladder and bowel control.  Bitter taste in the mouth.  Tongue biting. After a seizure, you Coover feel confused and sleepy. You Daidone also have an injury resulting from convulsions during the seizure. HOME CARE INSTRUCTIONS   If you are given medicines, take them exactly as prescribed by your health care provider.  Keep all follow-up appointments as directed by your health care provider.  Do not swim or drive or engage in risky activity during which a seizure could cause further injury to you or others until your health care provider says it is OK.  Get adequate rest.  Teach friends and family what to do if you have a seizure. They should:  Lay you on the ground to prevent a fall.  Put a cushion under your head.  Loosen any tight clothing around your neck.  Turn you on your side. If vomiting occurs, this helps keep your airway clear.  Stay with you until you recover.  Know whether or not you need emergency care. SEEK IMMEDIATE MEDICAL CARE IF:  The seizure lasts longer than 5 minutes.  The seizure is severe or you do not wake up immediately after the seizure.  You have an altered mental status  after the seizure.  You are having more frequent or worsening seizures. Someone should drive you to the emergency department or call local emergency services (911 in [place] MAKE SURE YOU:  Understand these instructions.  Will watch your condition.  Will get help right away if you are not doing well or get worse.   This information is not intended to replace advice given to you by your health care provider. Make sure you discuss any questions you have with your health care provider.   Document Released: 07/14/2000 Document Revised: 08/07/2014 Document Reviewed: 02/26/2013 Elsevier Interactive Patient Education 2016 Else[employer] Inc.  Nausea and Vomiting Nausea means you feel sick to your stomach. Throwing up (vomiting) is a reflex where stomach contents come out of your mouth. HOME CARE   Take medicine as told by your doctor.  Do not force yourself to eat. However, you do need to drink fluids.  If you feel like eating, eat a normal diet as told by your doctor.  Eat rice, wheat, potatoes, bread, lean meats, yogurt, fruits, and vegetables.  Avoid high-fat foods.  Drink enough fluids to keep your pee (urine) clear or pale yellow.  Ask your doctor how to replace body fluid losses (rehydrate). Signs of body fluid loss (dehydration) include:  Feeling very thirsty.  Dry lips and mouth.  Feeling dizzy.  Dark pee.  Peeing less than normal.  Feeling confused.  Fast breathing or heart rate. GET HELP RIGHT AWAY IF:   You have blood in  your throw up.  You have black or bloody poop (stool).  You have a bad headache or stiff neck.  You feel confused.  You have bad belly (abdominal) pain.  You have chest pain or trouble breathing.  You do not pee at least once every 8 hours.  You have cold, clammy skin.  You keep throwing up after 24 to 48 hours.  You have a fever. MAKE SURE YOU:   Understand these instructions.  Will watch your condition.  Will get help right  away if you are not doing well or get worse.   This information is not intended to replace advice given to you by your health care provider. Make sure you discuss any questions you have with your health care provider.   Document Released: 01/03/2008 Document Revised: 10/09/2011 Document Reviewed: 12/16/2010 Elsevier Interactive Patient Education [employer]

## 2015-12-12 NOTE — ED Notes (Signed)
Bed: WA14 Expected date:  Expected time:  Means of arrival:  Comments: seizure 

## 2015-12-14 ENCOUNTER — Ambulatory Visit (INDEPENDENT_AMBULATORY_CARE_PROVIDER_SITE_OTHER)
Admission: RE | Admit: 2015-12-14 | Discharge: 2015-12-14 | Disposition: A | Discharge status: Home / Self Care | Payer: Medicare Other | Source: Ambulatory Visit | Attending: Family Medicine | Admitting: Family Medicine

## 2015-12-14 ENCOUNTER — Encounter: Payer: Self-pay | Admitting: Family Medicine

## 2015-12-14 ENCOUNTER — Telehealth: Payer: Self-pay | Admitting: Family Medicine

## 2015-12-14 ENCOUNTER — Ambulatory Visit (INDEPENDENT_AMBULATORY_CARE_PROVIDER_SITE_OTHER): Payer: Medicare Other | Admitting: Family Medicine

## 2015-12-14 VITALS — BP 116/72 | HR 88 | Temp 98.6°F | Wt 161.5 lb

## 2015-12-14 DIAGNOSIS — Q909 Down syndrome, unspecified: Secondary | ICD-10-CM

## 2015-12-14 DIAGNOSIS — J189 Pneumonia, unspecified organism: Secondary | ICD-10-CM | POA: Diagnosis not present

## 2015-12-14 DIAGNOSIS — S70361A Insect bite (nonvenomous), right thigh, initial encounter: Secondary | ICD-10-CM

## 2015-12-14 DIAGNOSIS — W57XXXA Bitten or stung by nonvenomous insect and other nonvenomous arthropods, initial encounter: Secondary | ICD-10-CM

## 2015-12-14 DIAGNOSIS — R110 Nausea: Secondary | ICD-10-CM | POA: Diagnosis not present

## 2015-12-14 DIAGNOSIS — J810 Acute pulmonary edema: Secondary | ICD-10-CM | POA: Diagnosis not present

## 2015-12-14 DIAGNOSIS — G40909 Epilepsy, unspecified, not intractable, without status epilepticus: Secondary | ICD-10-CM | POA: Diagnosis not present

## 2015-12-14 MED ORDER — ONDANSETRON HCL 4 MG PO TABS: 4.0000 mg | ORAL_TABLET | Freq: Three times a day (TID) | ORAL | Status: DC | PRN

## 2015-12-14 NOTE — Progress Notes (Signed)
Pre visit review using our clinic review tool, if applicable. No additional management support is needed unless otherwise documented below in the visit note. 

## 2015-12-14 NOTE — Progress Notes (Addendum)
BP 116/72 mmHg  Pulse 88  Temp(Src) 98.6 F (37 C) (Oral)  Wt 161 lb 8 oz (73.256 kg)  SpO2 97%   CC: [facility] ER f/u visit  Subjective:    Patient ID: Kenneth [name], male    DOB: 18-Jul-1971, 45 y.o.   MRN: [id number]  HPI: Kenneth [name] is a 45 y.o. male presenting on 12/14/2015 for Follow-up   ER notes reviewed. Recent seizure, unresponsive afterwards which led to CPR. At ER, CXR showing possible pulm edema ?aspiration PNA. Started on doxycycline - compliant with this.   No seizures prior to this episode for 8-10 yrs.   Since he's been home, no cough, dyspnea, fever.  Staying cold. Recent tick bite found R lateral thigh. No rash, fever, endorsed new joint pains.  He does endorse diffuse pain - pan positive when questioned where he's hurting, unclear how reliable.   Relevant past medical, surgical, family and social history reviewed and updated as indicated. Interim medical history since our last visit reviewed. Allergies and medications reviewed and updated. Current Outpatient Prescriptions on File Prior to Visit  Medication Sig  . doxycycline (VIBRAMYCIN) 100 MG capsule Take 1 capsule (100 mg total) by mouth 2 (two) times daily.  Marland Kitchen gabapentin (NEURONTIN) 100 MG capsule Take 200 mg by mouth 2 (two) times daily.    Marland Kitchen PHENobarbital (LUMINAL) 64.8 MG tablet TAKE FOUR (4) TABLETS BY MOUTH AT BEDTIME  . pravastatin (PRAVACHOL) 10 MG tablet Take 1 tablet (10 mg total) by mouth daily.  . QUEtiapine (SEROQUEL) 300 MG tablet Take 600 mg by mouth at bedtime.  . vitamin B-12 (CYANOCOBALAMIN) 500 MCG tablet Take 500 mcg by mouth daily.   No current facility-administered medications on file prior to visit.    Review of Systems Per HPI unless specifically indicated in ROS section     Objective:    BP 116/72 mmHg  Pulse 88  Temp(Src) 98.6 F (37 C) (Oral)  Wt 161 lb 8 oz (73.256 kg)  SpO2 97%  Wt Readings from Last 3 Encounters:  12/14/15 161 lb 8 oz (73.256 kg)  07/23/15 167 lb  (75.751 kg)  07/13/14 167 lb 8 oz (75.978 kg)    Physical Exam  Constitutional: He is oriented to person, place, and time. He appears well-developed and well-nourished. No distress.  HENT:  Head: Normocephalic and atraumatic.  Mouth/Throat: Oropharynx is clear and moist. No oropharyngeal exudate.  Eyes: Conjunctivae and EOM are normal. Pupils are equal, round, and reactive to light.  Cardiovascular: Normal rate, regular rhythm, normal heart sounds and intact distal pulses.   No murmur heard. Pulmonary/Chest: Effort normal and breath sounds normal. No respiratory distress. He has no wheezes. He has no rales.  Distant breath sounds - poor effort  Abdominal: Soft. Bowel sounds are normal. He exhibits no distension and no mass. There is no tenderness. There is no rebound.  Dry retching during office visit  Musculoskeletal: He exhibits no edema.  Neurological: He is alert and oriented to person, place, and time.  Grip strength intact  Skin: Skin is warm and dry. No rash noted.  Nursing note and vitals reviewed.  Results for orders placed or performed in visit on 12/14/15  TSH  Result Value Ref Range   TSH 1.49 0.35 - 4.50 uIU/mL  Comprehensive metabolic panel  Result Value Ref Range   Sodium 140 135 - 145 mEq/L   Potassium 4.0 3.5 - 5.1 mEq/L   Chloride 103 96 - 112 mEq/L  CO2 30 19 - 32 mEq/L   Glucose, Bld 75 70 - 99 mg/dL   BUN 21 6 - 23 mg/dL   Creatinine, Ser 1.611.47 0.40 - 1.50 mg/dL   Total Bilirubin 0.2 0.2 - 1.2 mg/dL   Alkaline Phosphatase 108 39 - 117 U/L   AST 16 0 - 37 U/L   ALT 14 0 - 53 U/L   Total Protein 7.2 6.0 - 8.3 g/dL   Albumin 3.8 3.5 - 5.2 g/dL   Calcium 8.8 8.4 - 09.610.5 mg/dL   GFR 04.5455.18 (L) >09.81>60.00 mL/min  CBC with Differential/Platelet  Result Value Ref Range   WBC 14.4 (H) 4.0 - 10.5 K/uL   RBC 3.72 (L) 4.22 - 5.81 Mil/uL   Hemoglobin 12.5 (L) 13.0 - 17.0 g/dL   HCT 19.137.6 (L) 47.839.0 - 29.552.0 %   MCV 101.1 (H) 78.0 - 100.0 fl   MCHC 33.3 30.0 - 36.0 g/dL     RDW 62.115.4 30.811.5 - 65.715.5 %   Platelets 267.0 150.0 - 400.0 K/uL   Neutrophils Relative % 84.9 (H) 43.0 - 77.0 %   Lymphocytes Relative 10.1 (L) 12.0 - 46.0 %   Monocytes Relative 4.8 3.0 - 12.0 %   Eosinophils Relative 0.1 0.0 - 5.0 %   Basophils Relative 0.1 0.0 - 3.0 %   Neutro Abs 12.2 (H) 1.4 - 7.7 K/uL   Lymphs Abs 1.5 0.7 - 4.0 K/uL   Monocytes Absolute 0.7 0.1 - 1.0 K/uL   Eosinophils Absolute 0.0 0.0 - 0.7 K/uL   Basophils Absolute 0.0 0.0 - 0.1 K/uL  Phenobarbital level  Result Value Ref Range   Phenobarbital 36.3 15.0 - 40.0 mg/L      Assessment & Plan:   Problem List Items Addressed This Visit    Down syndrome   Seizure disorder (HCC) - Primary    First presumed seizure episode in the last 8-10 years despite compliance with phenobarbital.  Recheck level today.  ER work up reviewed - overall normal labs and head imaging.  Unclear etiology of this seizure. If recurrent spell, low threshold to refer back to neurology for re-evaluation. Sister who is primary caregiver agrees.       Relevant Orders   TSH (Completed)   Phenobarbital level (Completed)   Acute pulmonary edema (HCC)    Acute edema on xray at ER - will repeat xray today. Being treated for possible aspiration PNA with doxycycline course. No noted cough or fever at home over the last few days.       Relevant Orders   DG Chest 2 View (Completed)   Nausea without vomiting    Some dry retching in office - new finding. Will check labwork and repeat CXR. ?doxy related nausea - encouraged he take abx with food. Caregiver states pt has had normal appetite, eating normally at home.  I've also sent in zofran to use PRN nausea.      Relevant Orders   TSH (Completed)   Comprehensive metabolic panel (Completed)   CBC with Differential/Platelet (Completed)   Tick bite    No signs of cellulitis or tick borne illness. Regardless, has been started on doxycycline to cover possible aspiration PNA.           Follow up  plan: Return if symptoms worsen or fail to improve.  Eustaquio BoydenJavier Jagger Beahm, MD

## 2015-12-14 NOTE — Telephone Encounter (Signed)
Pt has appt 12/14/15 at 3:45 with Dr Reece AgarG.

## 2015-12-14 NOTE — Telephone Encounter (Signed)
Patient Name: Kenneth [name] DOB: 21-May-1971 Initial Comment Caller's brother has seizures and was seen in the Er- Had chest Xray done and he had food in his lungs. His head , chest and abd are hurting still. Nurse Assessment Nurse: Roma KayserForsythe, RN, Santina Evansatherine Date/Time (Eastern Time): 12/14/2015 8:15:04 AM Confirm and document reason for call. If symptomatic, describe symptoms. You must click the next button to save text entered. ---caller states pt had a seizure sunday and went to ER, told he aspirated and started him on abx, also had chest compressions done when he had first seizure because he wasnt breathing right, this morning he is complaining of his head hurting, abd pain and his chest is sore. want him to be rechecked by his doctor today. feels warm but dont know what his temp is. Has the patient traveled out of the country within the last 30 days? ---Not Applicable Does the patient have any new or worsening symptoms? ---Yes Will a triage be completed? ---Yes Related visit to physician within the last 2 weeks? ---Yes Does the PT have any chronic conditions? (i.e. diabetes, asthma, etc.) ---Unknown Is this a behavioral health or substance abuse call? ---No Guidelines Guideline Title Affirmed Question Affirmed Notes Pneumonia on Antibiotic Post- Hospitalization Follow-up Call Fever > 100.5 F (38.1 C) Final Disposition User See Physician within 24 Hours Roma KayserForsythe, RN, Walt DisneyCatherine Referrals REFERRED TO PCP OFFICE Disagree/Comply: Danella Maiersomply

## 2015-12-14 NOTE — Patient Instructions (Signed)
Blood work today Personal assistantXray today We will call you with results. Finish doxycycline course - take with food, caution with sunburns.  zofran for nausea if needed. Update us with how we're doing over next few days.

## 2015-12-15 ENCOUNTER — Encounter: Payer: Self-pay | Admitting: Family Medicine

## 2015-12-15 DIAGNOSIS — W57XXXA Bitten or stung by nonvenomous insect and other nonvenomous arthropods, initial encounter: Secondary | ICD-10-CM | POA: Insufficient documentation

## 2015-12-15 DIAGNOSIS — R110 Nausea: Secondary | ICD-10-CM | POA: Insufficient documentation

## 2015-12-15 DIAGNOSIS — J810 Acute pulmonary edema: Secondary | ICD-10-CM | POA: Insufficient documentation

## 2015-12-15 HISTORY — DX: Acute pulmonary edema: J81.0

## 2015-12-15 LAB — CBC WITH DIFFERENTIAL/PLATELET
BASOS ABS: 0 10*3/uL (ref 0.0–0.1)
Basophils Relative: 0.1 % (ref 0.0–3.0)
EOS ABS: 0 10*3/uL (ref 0.0–0.7)
Eosinophils Relative: 0.1 % (ref 0.0–5.0)
HCT: 37.6 % — ABNORMAL LOW (ref 39.0–52.0)
HEMOGLOBIN: 12.5 g/dL — AB (ref 13.0–17.0)
Lymphocytes Relative: 10.1 % — ABNORMAL LOW (ref 12.0–46.0)
Lymphs Abs: 1.5 10*3/uL (ref 0.7–4.0)
MCHC: 33.3 g/dL (ref 30.0–36.0)
MCV: 101.1 fl — ABNORMAL HIGH (ref 78.0–100.0)
MONOS PCT: 4.8 % (ref 3.0–12.0)
Monocytes Absolute: 0.7 10*3/uL (ref 0.1–1.0)
NEUTROS ABS: 12.2 10*3/uL — AB (ref 1.4–7.7)
Neutrophils Relative %: 84.9 % — ABNORMAL HIGH (ref 43.0–77.0)
Platelets: 267 10*3/uL (ref 150.0–400.0)
RBC: 3.72 Mil/uL — ABNORMAL LOW (ref 4.22–5.81)
RDW: 15.4 % (ref 11.5–15.5)
WBC: 14.4 10*3/uL — ABNORMAL HIGH (ref 4.0–10.5)

## 2015-12-15 LAB — COMPREHENSIVE METABOLIC PANEL
ALT: 14 U/L (ref 0–53)
AST: 16 U/L (ref 0–37)
Albumin: 3.8 g/dL (ref 3.5–5.2)
Alkaline Phosphatase: 108 U/L (ref 39–117)
BILIRUBIN TOTAL: 0.2 mg/dL (ref 0.2–1.2)
BUN: 21 mg/dL (ref 6–23)
CALCIUM: 8.8 mg/dL (ref 8.4–10.5)
CHLORIDE: 103 meq/L (ref 96–112)
CO2: 30 mEq/L (ref 19–32)
Creatinine, Ser: 1.47 mg/dL (ref 0.40–1.50)
GFR: 55.18 mL/min — AB (ref >60.00)
GLUCOSE: 75 mg/dL (ref 70–99)
Potassium: 4 mEq/L (ref 3.5–5.1)
Sodium: 140 mEq/L (ref 135–145)
Total Protein: 7.2 g/dL (ref 6.0–8.3)

## 2015-12-15 LAB — TSH: TSH: 1.49 u[IU]/mL (ref 0.35–4.50)

## 2015-12-15 NOTE — Assessment & Plan Note (Addendum)
Some dry retching in office - new finding. Will check labwork and repeat CXR. ?doxy related nausea - encouraged he take abx with food. Caregiver states pt has had normal appetite, eating normally at home.  I've also sent in zofran to use PRN nausea.

## 2015-12-15 NOTE — Assessment & Plan Note (Signed)
First presumed seizure episode in the last 8-10 years despite compliance with phenobarbital.  Recheck level today.  ER work up reviewed - overall normal labs and head imaging.  Unclear etiology of this seizure. If recurrent spell, low threshold to refer back to neurology for re-evaluation. Sister who is primary caregiver agrees.

## 2015-12-15 NOTE — Assessment & Plan Note (Addendum)
Acute edema on xray at ER - will repeat xray today. Being treated for possible aspiration PNA with doxycycline course. No noted cough or fever at home over the last few days.

## 2015-12-15 NOTE — Assessment & Plan Note (Signed)
No signs of cellulitis or tick borne illness. Regardless, has been started on doxycycline to cover possible aspiration PNA.

## 2015-12-16 LAB — PHENOBARBITAL LEVEL: Phenobarbital: 36.3 mg/L (ref 15.0–40.0)

## 2015-12-17 LAB — CULTURE, BLOOD (ROUTINE X 2)
Culture: NO GROWTH
Culture: NO GROWTH

## 2016-02-15 ENCOUNTER — Other Ambulatory Visit: Payer: Self-pay | Admitting: *Deleted

## 2016-02-15 MED ORDER — PHENOBARBITAL 64.8 MG PO TABS: ORAL_TABLET | ORAL | Status: DC

## 2016-03-14 DIAGNOSIS — F918 Other conduct disorders: Secondary | ICD-10-CM | POA: Diagnosis not present

## 2016-03-14 DIAGNOSIS — F72 Severe intellectual disabilities: Secondary | ICD-10-CM | POA: Diagnosis not present

## 2016-07-31 ENCOUNTER — Encounter (HOSPITAL_COMMUNITY): Payer: Self-pay | Admitting: Emergency Medicine

## 2016-07-31 ENCOUNTER — Ambulatory Visit (HOSPITAL_COMMUNITY)
Admission: EM | Admit: 2016-07-31 | Discharge: 2016-07-31 | Disposition: A | Discharge status: Home / Self Care | Payer: Medicare Other | Attending: Family Medicine | Admitting: Family Medicine

## 2016-07-31 ENCOUNTER — Ambulatory Visit (INDEPENDENT_AMBULATORY_CARE_PROVIDER_SITE_OTHER): Payer: Medicare Other

## 2016-07-31 DIAGNOSIS — M4126 Other idiopathic scoliosis, lumbar region: Secondary | ICD-10-CM

## 2016-07-31 DIAGNOSIS — K5900 Constipation, unspecified: Secondary | ICD-10-CM | POA: Diagnosis not present

## 2016-07-31 DIAGNOSIS — M545 Low back pain, unspecified: Secondary | ICD-10-CM

## 2016-07-31 MED ORDER — NAPROXEN 500 MG PO TABS: 500.0000 mg | ORAL_TABLET | Freq: Two times a day (BID) | ORAL | 0 refills | Status: DC

## 2016-07-31 MED ORDER — KETOROLAC TROMETHAMINE 60 MG/2ML IM SOLN
60.0000 mg | Freq: Once | INTRAMUSCULAR | Status: AC
Start: 2016-07-31 — End: 2016-07-31
  Administered 2016-07-31: 60 mg via INTRAMUSCULAR

## 2016-07-31 MED ORDER — POLYETHYLENE GLYCOL 3350 17 G PO PACK: 17.0000 g | PACK | Freq: Every day | ORAL | 0 refills | Status: DC

## 2016-07-31 MED ORDER — KETOROLAC TROMETHAMINE 60 MG/2ML IM SOLN
INTRAMUSCULAR | Status: AC
  Filled 2016-07-31: qty 2

## 2016-07-31 NOTE — ED Provider Notes (Signed)
CSN: [id number]     Arrival date & time 07/31/16  1239 History   First MD Initiated Contact with Patient 07/31/16 1344     Chief Complaint  Patient presents with  . Back Pain   (Consider location/radiation/quality/duration/timing/severity/associated sxs/prior Treatment) Patient c/o LBP for 4 days.  Patient does not communicate verbally and history is obtained through health care worker who is accompanying patient today.   Health Care worker states he fell but this was not witnessed.  No trauma seen from fall.  Patient has Down's Syndrome and lives at home with help of health care worker.   The history is provided by a caregiver. The history is limited by a developmental delay.  Back Pain  Location:  Lumbar spine Quality:  Aching Radiates to:  Does not radiate Pain severity:  Moderate Pain is:  Worse during the day Timing:  Constant Progression:  Worsening Chronicity:  New Context: falling   Relieved by:  Nothing Worsened by:  Nothing Ineffective treatments:  None tried   Past Medical History:  Diagnosis Date  . Cataracts, bilateral   . Congenital heart defect   . Down syndrome   . HLD (hyperlipidemia)   . Seizure disorder [facility](HCC)    History reviewed. No pertinent surgical history. Family History  Problem Relation Age of Onset  . Diabetes Mother   . Diabetes Father   . Hypertension Mother     Cerebral hemorrhage  . Hypertension Father   . Coronary artery disease Father [id number]    MI  . Stroke Father   . Cancer Maternal Grandmother     Oral  . Cancer Maternal Aunt     Colon   Social History  Substance Use Topics  . Smoking status: Never Smoker  . Smokeless tobacco: Never Used  . Alcohol use No    Review of Systems  Constitutional: Negative.   HENT: Negative.   Eyes: Negative.   Respiratory: Negative.   Cardiovascular: Negative.   Gastrointestinal: Negative.   Endocrine: Negative.   Genitourinary: Negative.   Musculoskeletal: Positive for back pain.  Skin:  Negative.   Allergic/Immunologic: Negative.   Neurological: Negative.   Hematological: Negative.   Psychiatric/Behavioral: Negative.     Allergies  Amoxicillin-pot clavulanate and Penicillins  Home Medications   Prior to Admission medications   Medication Sig Start Date End Date Taking? Authorizing Provider  gabapentin (NEURONTIN) 100 MG capsule Take 200 mg by mouth 2 (two) times daily.     Yes Historical Provider, MD  PHENobarbital (LUMINAL) 64.8 MG tablet TAKE FOUR (4) TABLETS BY MOUTH AT BEDTIME 02/15/16  Yes Eustaquio BoydenJavier Gutierrez, MD  pravastatin (PRAVACHOL) 10 MG tablet Take 1 tablet (10 mg total) by mouth daily. 07/23/15  Yes Eustaquio BoydenJavier Gutierrez, MD  QUEtiapine (SEROQUEL) 300 MG tablet Take 600 mg by mouth at bedtime.   Yes Historical Provider, MD  vitamin B-12 (CYANOCOBALAMIN) 500 MCG tablet Take 500 mcg by mouth daily.   Yes Historical Provider, MD  naproxen (NAPROSYN) 500 MG tablet Take 1 tablet (500 mg total) by mouth 2 (two) times daily with a meal. 07/31/16   Deatra CanterWilliam J Zarahi Fuerst, FNP  polyethylene glycol (MIRALAX / GLYCOLAX) packet Take 17 g by mouth daily. 07/31/16   Deatra CanterWilliam J Kiya Eno, FNP   Meds Ordered and Administered this Visit   Medications  ketorolac (TORADOL) injection 60 mg (60 mg Intramuscular Given 07/31/16 1428)    BP 114/75 (BP Location: Left Arm)   Pulse 89   Temp 98.6 F (37 C) (Temporal)  Resp 16   SpO2 99%  No data found.   Physical Exam  Constitutional: He appears well-developed and well-nourished.  HENT:  Head: Normocephalic and atraumatic.  Right Ear: External ear normal.  Left Ear: External ear normal.  Mouth/Throat: Oropharynx is clear and moist.  Eyes: Conjunctivae and EOM are normal. Pupils are equal, round, and reactive to light.  Neck: Normal range of motion. Neck supple.  Cardiovascular: Normal rate, regular rhythm and normal heart sounds.   Pulmonary/Chest: Effort normal and breath sounds normal.  Abdominal: Soft. Bowel sounds are normal.   Musculoskeletal: He exhibits tenderness.  TTP lumbar spine  Nursing note and vitals reviewed.   Urgent Care Course   Clinical Course     Procedures (including critical care time)  Labs Review Labs Reviewed - No data to display  Imaging Review Dg Lumbar Spine Complete  Result Date: 07/31/2016 CLINICAL DATA:  Low back pain. EXAM: LUMBAR SPINE - COMPLETE 4+ VIEW COMPARISON:  CT scan of the abdomen and pelvis dated 02/12/2015 FINDINGS: There is no acute abnormality. There is a rotoscoliosis with convexity to the left centered at L2-3. Fairly severe degenerative disc disease at L3-4 L4-5 and to a lesser degree at L2-3. Vestigial disc at S1-2. Facet arthritis at L3-4 to the right. IMPRESSION: No acute abnormality.  Multilevel degenerative disc disease. Electronically Signed   By: Francene Boyers M.D.   On: 07/31/2016 14:10     Visual Acuity Review  Right Eye Distance:   Left Eye Distance:   Bilateral Distance:    Right Eye Near:   Left Eye Near:    Bilateral Near:         MDM   1. Acute right-sided low back pain without sciatica   2. Constipation, unspecified constipation type   3. Other idiopathic scoliosis, lumbar region    Discussed with Health Care worker to have patient follow up with PCP. Toradol 60mg  IM Naprosyn 500mg  one po bid x 10 days #20  Miralax 17 grams one po qd #14      Deatra Canter, FNP 07/31/16 1457

## 2016-07-31 NOTE — ED Triage Notes (Signed)
[facility] a [facility] lower [facility] pain x 4 days.

## 2016-08-08 ENCOUNTER — Ambulatory Visit (INDEPENDENT_AMBULATORY_CARE_PROVIDER_SITE_OTHER): Payer: Medicare Other | Admitting: Family Medicine

## 2016-08-08 ENCOUNTER — Encounter: Payer: Self-pay | Admitting: Family Medicine

## 2016-08-08 VITALS — BP 100/70 | HR 76 | Temp 97.5°F | Wt 162.0 lb

## 2016-08-08 DIAGNOSIS — Z23 Encounter for immunization: Secondary | ICD-10-CM

## 2016-08-08 DIAGNOSIS — K5900 Constipation, unspecified: Secondary | ICD-10-CM | POA: Diagnosis not present

## 2016-08-08 DIAGNOSIS — M5136 Other intervertebral disc degeneration, lumbar region: Secondary | ICD-10-CM | POA: Insufficient documentation

## 2016-08-08 DIAGNOSIS — M4180 Other forms of scoliosis, site unspecified: Secondary | ICD-10-CM | POA: Insufficient documentation

## 2016-08-08 DIAGNOSIS — Q909 Down syndrome, unspecified: Secondary | ICD-10-CM

## 2016-08-08 NOTE — Progress Notes (Signed)
Pre visit review using our clinic review tool, if applicable. No additional management support is needed unless otherwise documented below in the visit note. 

## 2016-08-08 NOTE — Assessment & Plan Note (Signed)
Newly found. Discussed with caregiver. Will continue to monitor.

## 2016-08-08 NOTE — Progress Notes (Signed)
BP 100/70   Pulse 76   Temp 97.5 F (36.4 C) (Oral)   Wt 162 lb (73.5 kg)   SpO2 95%   BMI 26.15 kg/m    CC: f/u UCC visit Subjective:    Patient ID: Kenneth [name], male    DOB: 05/15/1971, 46 y.o.   MRN: [id number]  HPI: Kenneth [name] is a 46 y.o. male presenting on 08/08/2016 for Follow-up (from Urgent Care/[facility] / See xray)   Here with caregiver sister.  Recent eval at [facility] 07/31/2016 after unwitnessed fall with resultant ?LBP. Found by xray to have mod-severe scoliosis of lumbar spine as well as severe degenerative disc changes. Treated with toradol IM 60mg  and naprosyn 10d course. For constipation, treated with miralax.   Normally 1 BM daily.   Continues participating at [employer].   Relevant past medical, surgical, family and social history reviewed and updated as indicated. Interim medical history since our last visit reviewed. Allergies and medications reviewed and updated. Current Outpatient Prescriptions on File Prior to Visit  Medication Sig  . gabapentin (NEURONTIN) 100 MG capsule Take 200 mg by mouth 2 (two) times daily.    . naproxen (NAPROSYN) 500 MG tablet Take 1 tablet (500 mg total) by mouth 2 (two) times daily with a meal.  . PHENobarbital (LUMINAL) 64.8 MG tablet TAKE FOUR (4) TABLETS BY MOUTH AT BEDTIME  . pravastatin (PRAVACHOL) 10 MG tablet Take 1 tablet (10 mg total) by mouth daily.  . QUEtiapine (SEROQUEL) 300 MG tablet Take 600 mg by mouth at bedtime.  . vitamin B-12 (CYANOCOBALAMIN) 500 MCG tablet Take 500 mcg by mouth daily.  . polyethylene glycol (MIRALAX / GLYCOLAX) packet Take 17 g by mouth daily. (Patient not taking: Reported on 08/08/2016)   No current facility-administered medications on file prior to visit.     Review of Systems Per HPI unless specifically indicated in ROS section     Objective:    BP 100/70   Pulse 76   Temp 97.5 F (36.4 C) (Oral)   Wt 162 lb (73.5 kg)   SpO2 95%   BMI 26.15 kg/m   Wt Readings from Last 3  Encounters:  08/08/16 162 lb (73.5 kg)  12/14/15 161 lb 8 oz (73.3 kg)  07/23/15 167 lb (75.8 kg)    Physical Exam  Constitutional: He appears well-developed and well-nourished. No distress.  Eyes: Conjunctivae are normal. Pupils are equal, round, and reactive to light.  Neck: Normal range of motion. Neck supple.  Cardiovascular: Normal rate, regular rhythm, normal heart sounds and intact distal pulses.   No murmur heard. Pulmonary/Chest: Effort normal and breath sounds normal. No respiratory distress. He has no wheezes. He has no rales.  Abdominal: Soft. Normal appearance and bowel sounds are normal. There is no hepatosplenomegaly. There is tenderness (mild) in the right upper quadrant. There is no rigidity, no CVA tenderness and negative Murphy's sign. A hernia is present. Hernia confirmed positive in the ventral area (large).  Musculoskeletal: He exhibits no edema.  No pain midline spine No paraspinous mm tenderness Lumbar scoliosis present with toe touching, no significant thoracic scoliosis appreciated  Skin: Skin is warm and dry. No rash noted.  Psychiatric: He has a normal mood and affect.  Nursing note and vitals reviewed.  Dg Lumbar Spine Complete Result Date: 07/31/2016 CLINICAL DATA:  Low back pain. EXAM: LUMBAR SPINE - COMPLETE 4+ VIEW COMPARISON:  CT scan of the abdomen and pelvis dated 02/12/2015 FINDINGS: There is no acute abnormality.  There is a rotoscoliosis with convexity to the left centered at L2-3. Fairly severe degenerative disc disease at L3-4 L4-5 and to a lesser degree at L2-3. Vestigial disc at S1-2. Facet arthritis at L3-4 to the right. IMPRESSION: No acute abnormality.  Multilevel degenerative disc disease. Electronically Signed   By: James  Maxwell M.D.   On: 01Francene Boyers/07/2016 14:10     Assessment & Plan:   Problem List Items Addressed This Visit    Constipation    Discussed sparing miralax use, hold for diarrhea.       DDD (degenerative disc disease), lumbar     Discussed with caregiver.  Given h/o renal insuff, decrease naprosyn to 500mg  once daily. Discussed tylenol use PRN after completes NSAID course.       Down syndrome   Rotoscoliosis - Primary    Newly found. Discussed with caregiver. Will continue to monitor.          Follow up plan: No Follow-up on file.  Kenneth BoydenJavier Willella Harding, MD

## 2016-08-08 NOTE — Addendum Note (Signed)
Addended by: Sydell AxonLAWS, REGINA C on: 08/08/2016 12:34 PM   Modules accepted: Orders

## 2016-08-08 NOTE — Assessment & Plan Note (Signed)
Discussed sparing miralax use, hold for diarrhea.

## 2016-08-08 NOTE — Assessment & Plan Note (Signed)
Discussed with caregiver.  Given h/o renal insuff, decrease naprosyn to 500mg  once daily. Discussed tylenol use PRN after completes NSAID course.

## 2016-08-08 NOTE — Patient Instructions (Addendum)
Flu shot today Decrease miralax to 1/2 packet one day of weekend. Decrease naprosyn to once daily until you run out of medicine.  Once you finish naprosyn course, if ongoing lower back pain, you Verville start taking tylenol 500mg  once or twice daily as needed for back.  For lumbar scoliosis - we will continue to watch this. Xray also showed a lot of wear and tear arthritis.   Scoliosis Scoliosis is the name given to a spine that curves sideways.Scoliosis can cause twisting of your shoulders, hips, chest, back, and rib cage.  CAUSES  The cause of scoliosis is not always known. It Schillo be caused by a birth defect or by a disease that can cause muscular dysfunction and imbalance, such as cerebral palsy and muscular dystrophy.  RISK FACTORS Having a disease that causes muscle disease or dysfunction. SIGNS AND SYMPTOMS Scoliosis often has no signs or symptoms.If they are present, they Arnaud include:  Unequal size of one body side compared to the other (asymmetry).  Visible curvature of the spine.  Pain. The pain Vazquez limit physical activity.  Shortness of breath.  Bowel or bladder issues. DIAGNOSIS A skilled health care provider will perform an evaluation. This will involve:  Taking your history.  Performing a physical examination.  Performing a neurological exam to detect nerve or muscle function loss.  Range of motion studies on the spine.  X-rays. An MRI Arreola also be obtained. TREATMENT  Treatment varies depending on the nature, extent, and severity of the disease. If the curvature is not great, you Braver need only observation. A brace Marolf be used to prevent scoliosis from progressing. A brace Manor also be needed during growth spurts. Physical therapy Kerby be of benefit. Surgery Minervini be required.  HOME CARE INSTRUCTIONS   Your health care provider Zill suggest exercises to strengthen your muscles. Perform them as directed.  Ask your health care provider before participating in any sports.    If you have been prescribed an orthopedic brace, wear it as instructed by your health care provider. SEEK MEDICAL CARE IF: Your brace causes the skin to become sore (chafe) or is uncomfortable.  SEEK IMMEDIATE MEDICAL CARE IF:  You have back pain that is not relieved by the medicines prescribed by your health care provider.   Your legs feel weak or you lose function in your legs.  You lose some bowel or bladder control.  This information is not intended to replace advice given to you by your health care provider. Make sure you discuss any questions you have with your health care provider. Document Released: 07/14/2000 Document Revised: 07/22/2013 Document Reviewed: 03/24/2013 Elsevier Interactive Patient Education  2017 [employer]

## 2016-08-16 ENCOUNTER — Other Ambulatory Visit: Payer: Self-pay | Admitting: Family Medicine

## 2016-08-18 ENCOUNTER — Other Ambulatory Visit: Payer: Self-pay | Admitting: *Deleted

## 2016-08-18 NOTE — Telephone Encounter (Signed)
Ok to refill? Last filled 06/11/14

## 2016-08-21 NOTE — Telephone Encounter (Signed)
We haven't filled this before - can we check to see who filled in the past?

## 2016-08-21 NOTE — Telephone Encounter (Signed)
[place] and they said they sent it here in error.

## 2016-09-14 ENCOUNTER — Other Ambulatory Visit: Payer: Self-pay | Admitting: Family Medicine

## 2016-11-09 ENCOUNTER — Other Ambulatory Visit: Payer: Self-pay | Admitting: Family Medicine

## 2016-11-09 ENCOUNTER — Telehealth: Payer: Self-pay | Admitting: Family Medicine

## 2016-11-09 NOTE — Telephone Encounter (Signed)
Left pt message asking to call Kenneth [name] back directly at [phone] to schedule AWV/CPE with PCP only.

## 2016-11-09 NOTE — Telephone Encounter (Signed)
Scheduled 11/16/16

## 2016-11-12 ENCOUNTER — Other Ambulatory Visit: Payer: Self-pay | Admitting: Family Medicine

## 2016-11-12 DIAGNOSIS — G40909 Epilepsy, unspecified, not intractable, without status epilepticus: Secondary | ICD-10-CM

## 2016-11-12 DIAGNOSIS — E538 Deficiency of other specified B group vitamins: Secondary | ICD-10-CM

## 2016-11-12 DIAGNOSIS — E785 Hyperlipidemia, unspecified: Secondary | ICD-10-CM

## 2016-11-13 ENCOUNTER — Other Ambulatory Visit (INDEPENDENT_AMBULATORY_CARE_PROVIDER_SITE_OTHER): Payer: Medicare Other

## 2016-11-13 DIAGNOSIS — E538 Deficiency of other specified B group vitamins: Secondary | ICD-10-CM

## 2016-11-13 DIAGNOSIS — G40909 Epilepsy, unspecified, not intractable, without status epilepticus: Secondary | ICD-10-CM

## 2016-11-13 DIAGNOSIS — E785 Hyperlipidemia, unspecified: Secondary | ICD-10-CM | POA: Diagnosis not present

## 2016-11-13 LAB — VITAMIN B12: Vitamin B-12: 786 pg/mL (ref 211–911)

## 2016-11-13 LAB — COMPREHENSIVE METABOLIC PANEL
ALK PHOS: 100 U/L (ref 39–117)
ALT: 8 U/L (ref 0–53)
AST: 12 U/L (ref 0–37)
Albumin: 3.7 g/dL (ref 3.5–5.2)
BILIRUBIN TOTAL: 0.3 mg/dL (ref 0.2–1.2)
BUN: 16 mg/dL (ref 6–23)
CALCIUM: 8.9 mg/dL (ref 8.4–10.5)
CO2: 30 meq/L (ref 19–32)
CREATININE: 1.24 mg/dL (ref 0.40–1.50)
Chloride: 102 mEq/L (ref 96–112)
GFR: 66.88 mL/min (ref >60.00)
GLUCOSE: 99 mg/dL (ref 70–99)
Potassium: 4.3 mEq/L (ref 3.5–5.1)
Sodium: 138 mEq/L (ref 135–145)
TOTAL PROTEIN: 7.3 g/dL (ref 6.0–8.3)

## 2016-11-13 LAB — CBC WITH DIFFERENTIAL/PLATELET
BASOS ABS: 0.1 10*3/uL (ref 0.0–0.1)
Basophils Relative: 2.2 % (ref 0.0–3.0)
Eosinophils Absolute: 0 10*3/uL (ref 0.0–0.7)
Eosinophils Relative: 0.6 % (ref 0.0–5.0)
HCT: 37.9 % — ABNORMAL LOW (ref 39.0–52.0)
Hemoglobin: 12.6 g/dL — ABNORMAL LOW (ref 13.0–17.0)
LYMPHS ABS: 1.2 10*3/uL (ref 0.7–4.0)
Lymphocytes Relative: 27.9 % (ref 12.0–46.0)
MCHC: 33.4 g/dL (ref 30.0–36.0)
MCV: 100.9 fl — AB (ref 78.0–100.0)
Monocytes Absolute: 0.5 10*3/uL (ref 0.1–1.0)
Monocytes Relative: 11.3 % (ref 3.0–12.0)
NEUTROS ABS: 2.4 10*3/uL (ref 1.4–7.7)
NEUTROS PCT: 58 % (ref 43.0–77.0)
Platelets: 295 10*3/uL (ref 150.0–400.0)
RBC: 3.75 Mil/uL — AB (ref 4.22–5.81)
RDW: 16.2 % — AB (ref 11.5–15.5)
WBC: 4.2 10*3/uL (ref 4.0–10.5)

## 2016-11-13 LAB — LIPID PANEL
CHOL/HDL RATIO: 3
Cholesterol: 186 mg/dL (ref 0–200)
HDL: 57.4 mg/dL (ref >39.00)
LDL Cholesterol: 115 mg/dL — ABNORMAL HIGH (ref 0–99)
NONHDL: 128.73
TRIGLYCERIDES: 69 mg/dL (ref 0.0–149.0)
VLDL: 13.8 mg/dL (ref 0.0–40.0)

## 2016-11-16 ENCOUNTER — Ambulatory Visit (INDEPENDENT_AMBULATORY_CARE_PROVIDER_SITE_OTHER): Payer: Medicare Other | Admitting: Family Medicine

## 2016-11-16 ENCOUNTER — Encounter: Payer: Self-pay | Admitting: Family Medicine

## 2016-11-16 VITALS — BP 132/80 | HR 66 | Temp 97.3°F | Ht 63.75 in | Wt 156.2 lb

## 2016-11-16 DIAGNOSIS — Z7189 Other specified counseling: Secondary | ICD-10-CM | POA: Diagnosis not present

## 2016-11-16 DIAGNOSIS — E785 Hyperlipidemia, unspecified: Secondary | ICD-10-CM | POA: Diagnosis not present

## 2016-11-16 DIAGNOSIS — E538 Deficiency of other specified B group vitamins: Secondary | ICD-10-CM

## 2016-11-16 DIAGNOSIS — H9193 Unspecified hearing loss, bilateral: Secondary | ICD-10-CM | POA: Diagnosis not present

## 2016-11-16 DIAGNOSIS — Q909 Down syndrome, unspecified: Secondary | ICD-10-CM | POA: Diagnosis not present

## 2016-11-16 DIAGNOSIS — M4180 Other forms of scoliosis, site unspecified: Secondary | ICD-10-CM

## 2016-11-16 DIAGNOSIS — G40909 Epilepsy, unspecified, not intractable, without status epilepticus: Secondary | ICD-10-CM | POA: Diagnosis not present

## 2016-11-16 DIAGNOSIS — Z0000 Encounter for general adult medical examination without abnormal findings: Secondary | ICD-10-CM | POA: Diagnosis not present

## 2016-11-16 MED ORDER — PRAVASTATIN SODIUM 10 MG PO TABS: 10.0000 mg | ORAL_TABLET | Freq: Every day | ORAL | 3 refills | Status: DC

## 2016-11-16 NOTE — Assessment & Plan Note (Signed)
Continue phenobarb, gabapentin.

## 2016-11-16 NOTE — Assessment & Plan Note (Signed)
Chronic, stable. Continue pravastatin 

## 2016-11-16 NOTE — Assessment & Plan Note (Signed)
No significant back pain endorsed.

## 2016-11-16 NOTE — Progress Notes (Signed)
BP 132/80   Pulse 66   Temp 97.3 F (36.3 C) (Oral)   Ht 5' 3.75" (1.619 m)   Wt 156 lb 4 oz (70.9 kg)   SpO2 99%   BMI 27.03 kg/m    CC: medicare wellness visit Subjective:    Patient ID: Kenneth [name], male    DOB: 26-Jan-1971, 46 y.o.   MRN: [id number]  HPI: Kenneth [name] is a 46 y.o. male presenting on 11/16/2016 for Annual Exam (Medicare) and Ear Pain (bilateral)   Here with caregiver sister and niece.  Unwitnessed fall 07/2016 s/p UCC evaluation. Xray showed mod-severe rotoscoliosis of lumbar spine as well as severe DDD.  Recently had hypoglycemia to 42 with lethargy. This was after high candy intake the night before (he sneaked this into his room).  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   Trouble with vision and hearing screens today. Sees eye doctor yearly. + falls in past year.  No memory troubles endorsed by family.   Preventative: Flu shot today  Tdap 10/2013  Advanced directives: Handout provided. HCPOA is sister Adolph Pollack. Seat belt use discussed  Sunscreen use discussed. No changing moles on skin. Non smoker Alcohol - none  Lives with his sister and her family, 5 dogs, 1 cat, 4 goats, 20 chickens  Goes to [employer] on weekdays  [facility] [facility] [place] [facility] [place] [facility]  Diet: good water, fruits/vegetables   Relevant past medical, surgical, family and social history reviewed and updated as indicated. Interim medical history since our last visit reviewed. Allergies and medications reviewed and updated. Outpatient Medications Prior to Visit  Medication Sig Dispense Refill  . gabapentin (NEURONTIN) 100 MG capsule TAKE TWO CAPSULES BY MOUTH TWO TIMES A DAY (EVERY MORNING AND BEDTIME) 120 capsule 3  . PHENobarbital (LUMINAL) 64.8 MG tablet TAKE FOUR (4) TABLETS BY MOUTH AT BEDTIME 360 tablet 1  . polyethylene glycol (MIRALAX / GLYCOLAX) packet Take 17 g by mouth daily. (Patient taking differently: Take 17 g by mouth daily. 1/2 pack on weekends) 14 each  0  . QUEtiapine (SEROQUEL) 300 MG tablet Take 600 mg by mouth at bedtime.    . vitamin B-12 (CYANOCOBALAMIN) 500 MCG tablet Take 500 mcg by mouth daily.    . naproxen (NAPROSYN) 500 MG tablet Take 1 tablet (500 mg total) by mouth 2 (two) times daily with a meal. 20 tablet 0  . pravastatin (PRAVACHOL) 10 MG tablet TAKE 1 TABLET BY MOUTH DAILY 30 tablet 6   No facility-administered medications prior to visit.      Per HPI unless specifically indicated in ROS section below Review of Systems     Objective:    BP 132/80   Pulse 66   Temp 97.3 F (36.3 C) (Oral)   Ht 5' 3.75" (1.619 m)   Wt 156 lb 4 oz (70.9 kg)   SpO2 99%   BMI 27.03 kg/m   Wt Readings from Last 3 Encounters:  11/16/16 156 lb 4 oz (70.9 kg)  08/08/16 162 lb (73.5 kg)  12/14/15 161 lb 8 oz (73.3 kg)    Physical Exam  Constitutional: He is oriented to person, place, and time. He appears well-developed and well-nourished. No distress.  HENT:  Head: Normocephalic and atraumatic.  Right Ear: Hearing, tympanic membrane, external ear and ear canal normal.  Left Ear: Hearing, tympanic membrane, external ear and ear canal normal.  Nose: Nose normal.  Mouth/Throat: Uvula is midline, oropharynx is clear and moist and  mucous membranes are normal. No oropharyngeal exudate, posterior oropharyngeal edema or posterior oropharyngeal erythema.  Eyes: Conjunctivae and EOM are normal. Pupils are equal, round, and reactive to light. No scleral icterus.  Neck: Normal range of motion. Neck supple. No thyromegaly present.  Cardiovascular: Normal rate, regular rhythm, normal heart sounds and intact distal pulses.   No murmur heard. Pulses:      Radial pulses are 2+ on the right side, and 2+ on the left side.  Pulmonary/Chest: Effort normal and breath sounds normal. No respiratory distress. He has no wheezes. He has no rales.  Abdominal: Soft. Bowel sounds are normal. He exhibits no distension and no mass. There is no tenderness. There  is no rebound and no guarding.  Musculoskeletal: Normal range of motion. He exhibits no edema.  Lymphadenopathy:    He has no cervical adenopathy.  Neurological: He is alert and oriented to person, place, and time.  CN grossly intact, station and gait intact  Skin: Skin is warm and dry. No rash noted.  Psychiatric: He has a normal mood and affect. His behavior is normal. Judgment and thought content normal.  Nursing note and vitals reviewed.  Results for orders placed or performed in visit on 11/13/16  Lipid panel  Result Value Ref Range   Cholesterol 186 0 - 200 mg/dL   Triglycerides 73.2 0.0 - 149.0 mg/dL   HDL 20.25 >42.70 mg/dL   VLDL 62.3 0.0 - 76.2 mg/dL   LDL Cholesterol 831 (H) 0 - 99 mg/dL   Total CHOL/HDL Ratio 3    NonHDL 128.73   Comprehensive metabolic panel  Result Value Ref Range   Sodium 138 135 - 145 mEq/L   Potassium 4.3 3.5 - 5.1 mEq/L   Chloride 102 96 - 112 mEq/L   CO2 30 19 - 32 mEq/L   Glucose, Bld 99 70 - 99 mg/dL   BUN 16 6 - 23 mg/dL   Creatinine, Ser 5.17 0.40 - 1.50 mg/dL   Total Bilirubin 0.3 0.2 - 1.2 mg/dL   Alkaline Phosphatase 100 39 - 117 U/L   AST 12 0 - 37 U/L   ALT 8 0 - 53 U/L   Total Protein 7.3 6.0 - 8.3 g/dL   Albumin 3.7 3.5 - 5.2 g/dL   Calcium 8.9 8.4 - 61.6 mg/dL   GFR 07.37 >10.62 mL/min  CBC with Differential/Platelet  Result Value Ref Range   WBC 4.2 4.0 - 10.5 K/uL   RBC 3.75 (L) 4.22 - 5.81 Mil/uL   Hemoglobin 12.6 (L) 13.0 - 17.0 g/dL   HCT 69.4 (L) 85.4 - 62.7 %   MCV 100.9 (H) 78.0 - 100.0 fl   MCHC 33.4 30.0 - 36.0 g/dL   RDW 03.5 (H) 00.9 - 38.1 %   Platelets 295.0 150.0 - 400.0 K/uL   Neutrophils Relative % 58.0 43.0 - 77.0 %   Lymphocytes Relative 27.9 12.0 - 46.0 %   Monocytes Relative 11.3 3.0 - 12.0 %   Eosinophils Relative 0.6 0.0 - 5.0 %   Basophils Relative 2.2 0.0 - 3.0 %   Neutro Abs 2.4 1.4 - 7.7 K/uL   Lymphs Abs 1.2 0.7 - 4.0 K/uL   Monocytes Absolute 0.5 0.1 - 1.0 K/uL   Eosinophils Absolute  0.0 0.0 - 0.7 K/uL   Basophils Absolute 0.1 0.0 - 0.1 K/uL  Vitamin B12  Result Value Ref Range   Vitamin B-12 786 211 - 911 pg/mL      Assessment & Plan:  Problem List Items Addressed This Visit    Advanced care planning/counseling discussion    Handout provided. HCPOA is sister Adolph Pollack.      Down syndrome    Chronic, stable. No memory changes endorsed by family. Continues attending the [employer] on week days.      Relevant Orders   Ambulatory referral to Audiology   Hearing trouble, bilateral    Unable to follow instructions this year for hearing screen. Sister caregiver endorses increased difficulty getting Lashaun's attention at home. Will refer to audiology for further evaluation.  No signs of AOM.       Relevant Orders   Ambulatory referral to Audiology   HLD (hyperlipidemia)    Chronic, stable. Continue pravastatin        Relevant Medications   pravastatin (PRAVACHOL) 10 MG tablet   Medicare annual wellness visit, subsequent - Primary    I have personally reviewed the Medicare Annual Wellness questionnaire and have noted 1. The patient's medical and social history 2. Their use of alcohol, tobacco or illicit drugs 3. Their current medications and supplements 4. The patient's functional ability including ADL's, fall risks, home safety risks and hearing or visual impairment. Cognitive function has been assessed and addressed as indicated.  5. Diet and physical activity 6. Evidence for depression or mood disorders The patients weight, height, BMI have been recorded in the chart. I have made referrals, counseling and provided education to the patient based on review of the above and I have provided the pt with a written personalized care plan for preventive services. Provider list updated.. See scanned questionairre as needed for further documentation. Reviewed preventative protocols and updated unless pt declined.       Rotoscoliosis    No significant  back pain endorsed.      Seizure disorder (HCC)    Continue phenobarb, gabapentin.       Vitamin B12 deficiency    Continue b12 daily.           Follow up plan: Return in about 1 year (around 11/16/2017) for medicare wellness visit.  Eustaquio Boyden, MD

## 2016-11-16 NOTE — Progress Notes (Signed)
Pre visit review using our clinic review tool, if applicable. No additional management support is needed unless otherwise documented below in the visit note. 

## 2016-11-16 NOTE — Assessment & Plan Note (Signed)

## 2016-11-16 NOTE — Assessment & Plan Note (Addendum)
Unable to follow instructions this year for hearing screen. Sister caregiver endorses increased difficulty getting Kenneth [name]'s attention at home. Will refer to audiology for further evaluation.  No signs of AOM.

## 2016-11-16 NOTE — Assessment & Plan Note (Signed)
Chronic, stable. No memory changes endorsed by family. Continues attending the [employer] on week days.

## 2016-11-16 NOTE — Patient Instructions (Addendum)
Good to see you today. Return as needed or in 1 year for next medicare wellness visit. See marion on your way out to schedule audiology evaluation.   Health Maintenance, Male A healthy lifestyle and preventive care is important for your health and wellness. Ask your health care provider about what schedule of regular examinations is right for you. What should I know about weight and diet?  Eat a Healthy Diet  Eat plenty of vegetables, fruits, whole grains, low-fat dairy products, and lean protein.  Do not eat a lot of foods high in solid fats, added sugars, or salt. Maintain a Healthy Weight  Regular exercise can help you achieve or maintain a healthy weight. You should:  Do at least 150 minutes of exercise each week. The exercise should increase your heart rate and make you sweat (moderate-intensity exercise).  Do strength-training exercises at least twice a week. Watch Your Levels of Cholesterol and Blood Lipids  Have your blood tested for lipids and cholesterol every 5 years starting at 46 years of age. If you are at high risk for heart disease, you should start having your blood tested when you are 46 years old. You Blumstein need to have your cholesterol levels checked more often if:  Your lipid or cholesterol levels are high.  You are older than 46 years of age.  You are at high risk for heart disease. What should I know about cancer screening? Many types of cancers can be detected early and Steeves often be prevented. Lung Cancer  You should be screened every year for lung cancer if:  You are a current smoker who has smoked for at least 30 years.  You are a former smoker who has quit within the past 15 years.  Talk to your health care provider about your screening options, when you should start screening, and how often you should be screened. Colorectal Cancer  Routine colorectal cancer screening usually begins at 46 years of age and should be repeated every 5-10 years until you  are 46 years old. You Nodal need to be screened more often if early forms of precancerous polyps or small growths are found. Your health care provider Linsey recommend screening at an earlier age if you have risk factors for colon cancer.  Your health care provider Avitia recommend using home test kits to check for hidden blood in the stool.  A small camera at the end of a tube can be used to examine your colon (sigmoidoscopy or colonoscopy). This checks for the earliest forms of colorectal cancer. Prostate and Testicular Cancer  Depending on your age and overall health, your health care provider Dokken do certain tests to screen for prostate and testicular cancer.  Talk to your health care provider about any symptoms or concerns you have about testicular or prostate cancer. Skin Cancer  Check your skin from head to toe regularly.  Tell your health care provider about any new moles or changes in moles, especially if:  There is a change in a mole's size, shape, or color.  You have a mole that is larger than a pencil eraser.  Always use sunscreen. Apply sunscreen liberally and repeat throughout the day.  Protect yourself by wearing long sleeves, pants, a wide-brimmed hat, and sunglasses when outside. What should I know about heart disease, diabetes, and high blood pressure?  If you are 78-58 years of age, have your blood pressure checked every 3-5 years. If you are 36 years of age or older, have your  blood pressure checked every year. You should have your blood pressure measured twice-once when you are at a hospital or clinic, and once when you are not at a hospital or clinic. Record the average of the two measurements. To check your blood pressure when you are not at a hospital or clinic, you can use:  An automated blood pressure machine at a pharmacy.  A home blood pressure monitor.  Talk to your health care provider about your target blood pressure.  If you are between 79-68 years old, ask  your health care provider if you should take aspirin to prevent heart disease.  Have regular diabetes screenings by checking your fasting blood sugar level.  If you are at a normal weight and have a low risk for diabetes, have this test once every three years after the age of 65.  If you are overweight and have a high risk for diabetes, consider being tested at a younger age or more often.  A one-time screening for abdominal aortic aneurysm (AAA) by ultrasound is recommended for men aged 65-75 years who are current or former smokers. What should I know about preventing infection? Hepatitis B  If you have a higher risk for hepatitis B, you should be screened for this virus. Talk with your health care provider to find out if you are at risk for hepatitis B infection. Hepatitis C  Blood testing is recommended for:  Everyone born from [birth year] through 1965.  Anyone with known risk factors for hepatitis C. Sexually Transmitted Diseases (STDs)  You should be screened each year for STDs including gonorrhea and chlamydia if:  You are sexually active and are younger than 46 years of age.  You are older than 46 years of age and your health care provider tells you that you are at risk for this type of infection.  Your sexual activity has changed since you were last screened and you are at an increased risk for chlamydia or gonorrhea. Ask your health care provider if you are at risk.  Talk with your health care provider about whether you are at high risk of being infected with HIV. Your health care provider Celaya recommend a prescription medicine to help prevent HIV infection. What else can I do?  Schedule regular health, dental, and eye exams.  Stay current with your vaccines (immunizations).  Do not use any tobacco products, such as cigarettes, chewing tobacco, and e-cigarettes. If you need help quitting, ask your health care provider.  Limit alcohol intake to no more than 2 drinks per day. One  drink equals 12 ounces of beer, 5 ounces of wine, or 1 ounces of hard liquor.  Do not use street drugs.  Do not share needles.  Ask your health care provider for help if you need support or information about quitting drugs.  Tell your health care provider if you often feel depressed.  Tell your health care provider if you have ever been abused or do not feel safe at home. This information is not intended to replace advice given to you by your health care provider. Make sure you discuss any questions you have with your health care provider. Document Released: 01/13/2008 Document Revised: 03/15/2016 Document Reviewed: 04/20/2015 Elsevier Interactive Patient Education  2017 [employer].

## 2016-11-16 NOTE — Assessment & Plan Note (Signed)
Handout provided. HCPOA is sister Adolph Pollack.

## 2016-11-16 NOTE — Assessment & Plan Note (Signed)
Continue b12 500mcg daily.  

## 2016-11-21 ENCOUNTER — Encounter (HOSPITAL_COMMUNITY): Payer: Self-pay | Admitting: Obstetrics and Gynecology

## 2016-11-21 ENCOUNTER — Emergency Department (HOSPITAL_COMMUNITY)
Admission: EM | Admit: 2016-11-21 | Discharge: 2016-11-21 | Disposition: A | Discharge status: Home / Self Care | Payer: Medicare Other | Attending: Emergency Medicine | Admitting: Emergency Medicine

## 2016-11-21 DIAGNOSIS — Z79899 Other long term (current) drug therapy: Secondary | ICD-10-CM | POA: Insufficient documentation

## 2016-11-21 DIAGNOSIS — R569 Unspecified convulsions: Secondary | ICD-10-CM | POA: Insufficient documentation

## 2016-11-21 DIAGNOSIS — R404 Transient alteration of awareness: Secondary | ICD-10-CM | POA: Diagnosis not present

## 2016-11-21 DIAGNOSIS — G40909 Epilepsy, unspecified, not intractable, without status epilepticus: Secondary | ICD-10-CM | POA: Diagnosis not present

## 2016-11-21 LAB — CBG MONITORING, ED: Glucose-Capillary: 107 mg/dL — ABNORMAL HIGH (ref 65–99)

## 2016-11-21 MED ORDER — LORAZEPAM 2 MG/ML IJ SOLN
0.5000 mg | Freq: Once | INTRAMUSCULAR | Status: AC
  Administered 2016-11-21: 0.5 mg via INTRAVENOUS
  Filled 2016-11-21: qty 1

## 2016-11-21 MED ORDER — ONDANSETRON HCL 4 MG/2ML IJ SOLN
4.0000 mg | Freq: Once | INTRAMUSCULAR | Status: AC
  Administered 2016-11-21: 4 mg via INTRAVENOUS
  Filled 2016-11-21: qty 2

## 2016-11-21 NOTE — ED Triage Notes (Signed)
Per EMS:  Pt was on the transport bus and the driver witnessed seizure like activity while pt was in his seat.  Pt has a hx of Autism and Down syndrome and high cholesterol Per Sister to EMS, pt is usually incomprehensible when speaking  Given Pentobarbital, Seroquel  CBG 142 22 in L hand 138/90 Hr 72 98 on RA   Pt was combative and spitting

## 2016-11-21 NOTE — ED Notes (Signed)
Pt ambulatory and independent at discharge.  Sister/caregiver verbalized understanding of discharge instructions.

## 2016-11-21 NOTE — ED Provider Notes (Signed)
[facility]-EMERGENCY DEPT Provider Note   CSN: [id number] Arrival date & time: 11/21/16  1644     History   Chief Complaint Chief Complaint  Patient presents with  . Seizures    HPI Kenneth [name] is a 46 y.o. male.  46 yo M with a cc of a seizure.  Hx of seizure disorder, on phenobarbital.  Patient with autism, non compliant with further hx.  Level V caveat non verbal.    The history is provided by the patient.  Seizures   This is a new problem. The current episode started less than 1 hour ago. There was 1 seizure. The most recent episode lasted less than 30 seconds. Pertinent negatives include no confusion, no headaches, no visual disturbance, no chest pain, no vomiting and no diarrhea. Characteristics include eye blinking. The episode was witnessed. There was no sensation of an aura present. The seizures did not continue in the ED. The seizure(s) had no focality. There has been no fever. The fever has been present for less than 1 day.    Past Medical History:  Diagnosis Date  . Cataracts, bilateral   . Congenital heart defect   . Down syndrome   . HLD (hyperlipidemia)   . Seizure disorder [facility])     Patient Active Problem List   Diagnosis Date Noted  . Hearing trouble, bilateral 11/16/2016  . 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 DDD (degenerative disc disease), lumbar 08/08/2016  . Constipation 08/08/2016  . Acute pulmonary edema (HCC) 12/15/2015  . Advanced care planning/counseling discussion 07/13/2014  . Medicare annual wellness visit, subsequent 07/11/2013  . Down syndrome   . Seizure disorder (HCC)   . Vitamin B12 deficiency 04/22/2010  . HLD (hyperlipidemia) 02/07/2007  . 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 No pertinent surgical history.     Home Medications    Prior to Admission medications   Medication Sig Start Date End Date Taking? Authorizing Provider  gabapentin (NEURONTIN) 100 MG capsule TAKE TWO CAPSULES BY MOUTH TWO TIMES A DAY (EVERY  MORNING AND BEDTIME) 11/09/16  Yes Eustaquio Boyden, MD  PHENobarbital (LUMINAL) 64.8 MG tablet TAKE FOUR (4) TABLETS BY MOUTH AT BEDTIME 09/14/16  Yes Eustaquio Boyden, MD  polyethylene glycol (MIRALAX / GLYCOLAX) packet Take 17 g by mouth daily. Patient taking differently: Take 8.5 g by mouth as directed. Take on Sat ONLY. 07/31/16  Yes Deatra Canter, FNP  pravastatin (PRAVACHOL) 10 MG tablet Take 1 tablet (10 mg total) by mouth daily. 11/16/16  Yes Eustaquio Boyden, MD  QUEtiapine (SEROQUEL) 300 MG tablet Take 600 mg by mouth at bedtime.   Yes Historical Provider, MD  vitamin B-12 (CYANOCOBALAMIN) 500 MCG tablet Take 500 mcg by mouth daily.   Yes Historical Provider, MD    Family History Family History  Problem Relation Age of Onset  . Diabetes Mother   . Hypertension Mother     Cerebral hemorrhage  . Diabetes Father   . Hypertension Father   . Coronary artery disease Father 63    MI  . Stroke Father   . Cancer Maternal Grandmother     Oral  . Cancer Maternal Aunt     Colon    Social History Social History  Substance Use Topics  . Smoking status: Never Smoker  . Smokeless tobacco: Never Used  . Alcohol use No     Allergies   Amoxicillin-pot clavulanate and Penicillins   Review of Systems Review of Systems  Constitutional: Negative for chills and fever.  HENT: Negative for congestion and facial swelling.   Eyes: Negative for discharge and visual disturbance.  Respiratory: Negative for shortness of breath.   Cardiovascular: Negative for chest pain and palpitations.  Gastrointestinal: Negative for abdominal pain, diarrhea and vomiting.  Musculoskeletal: Negative for arthralgias and myalgias.  Skin: Negative for color change and rash.  Neurological: Positive for seizures. Negative for tremors, syncope and headaches.  Psychiatric/Behavioral: Negative for confusion and dysphoric mood.     Physical Exam Updated Vital Signs BP 116/90 (BP Location: Left Arm)   Pulse  76   Temp 98.9 F (37.2 C) (Oral)   Resp 14   SpO2 94%   Physical Exam  Constitutional: He is oriented to person, place, and time. He appears well-developed and well-nourished.  HENT:  Head: Normocephalic and atraumatic.  Eyes: EOM are normal. Pupils are equal, round, and reactive to light.  Neck: Normal range of motion. Neck supple. No JVD present.  Cardiovascular: Normal rate and regular rhythm.  Exam reveals no gallop and no friction rub.   No murmur heard. Pulmonary/Chest: No respiratory distress. He has no wheezes.  Abdominal: He exhibits no distension. There is no rebound and no guarding.  Musculoskeletal: Normal range of motion.  Neurological: He is alert and oriented to person, place, and time.  Skin: No rash noted. No pallor.  Psychiatric: He has a normal mood and affect. His behavior is normal.  Nursing note and vitals reviewed.    ED Treatments / Results  Labs (all labs ordered are listed, but only abnormal results are displayed) Labs Reviewed  CBG MONITORING, ED - Abnormal; Notable for the following:       Result Value   Glucose-Capillary 107 (*)    All other components within normal limits    EKG  EKG Interpretation None       Radiology No results found.  Procedures Procedures (including critical care time)  Medications Ordered in ED Medications  LORazepam (ATIVAN) injection 0.5 mg (0.5 mg Intravenous Given 11/21/16 1803)  ondansetron (ZOFRAN) injection 4 mg (4 mg Intravenous Given 11/21/16 1803)     Initial Impression / Assessment and Plan / ED Course  I have reviewed the triage vital signs and the nursing notes.  Pertinent labs & imaging results that were available during my care of the patient were reviewed by me and considered in my medical decision making (see chart for details).     46 yo M with a hx of seizure disorder had a seizure.  Back to baseline, no noted injury.   Back to baseline, ambulatory.  D/c home with PCP follow up.  As  Patient was seizure-free for quite some time and now has had 2 breakthrough seizures recently refer him to neurology.  6:29 PM:  I have discussed the diagnosis/risks/treatment options with the patient and family and believe the pt to be eligible for discharge home to follow-up with PCP, neuro. We also discussed returning to the ED immediately if new or worsening sx occur. We discussed the sx which are most concerning (e.g., sudden worsening pain, fever, inability to tolerate by mouth) that necessitate immediate return. Medications administered to the patient during their visit and any new prescriptions provided to the patient are listed below.  Medications given during this visit Medications  LORazepam (ATIVAN) injection 0.5 mg (0.5 mg Intravenous Given 11/21/16 1803)  ondansetron (ZOFRAN) injection 4 mg (4 mg Intravenous Given 11/21/16 1803)     The patient appears reasonably screen and/or stabilized for discharge and I  doubt any other medical condition or other [facility] requiring further screening, evaluation, or treatment in the ED at this time prior to discharge.    Final Clinical Impressions(s) / ED Diagnoses   Final diagnoses:  Seizure [facility])    New Prescriptions New Prescriptions   No medications on file     Melene Plan, DO 11/21/16 1829

## 2016-11-21 NOTE — ED Notes (Signed)
Bed: WA17 Expected date: 11/21/16 Expected time:  Means of arrival:  Comments: EMS

## 2016-11-22 ENCOUNTER — Encounter: Payer: Self-pay | Admitting: Neurology

## 2016-11-22 ENCOUNTER — Telehealth: Payer: Self-pay

## 2016-11-22 NOTE — Telephone Encounter (Signed)
V/M left on v/m that pt was seen at [facility] ED on 11/21/16 with seizure; pt was referred to [facility] neurology in [place]. V/M left male voice will call today to schedule appt. FYI to Dr Reece Agar to be aware what is going on with pt.

## 2016-11-22 NOTE — Telephone Encounter (Signed)
Noted. Thanks.

## 2016-12-22 ENCOUNTER — Encounter: Payer: Self-pay | Admitting: Neurology

## 2016-12-22 ENCOUNTER — Ambulatory Visit (INDEPENDENT_AMBULATORY_CARE_PROVIDER_SITE_OTHER): Payer: Medicare Other | Admitting: Neurology

## 2016-12-22 VITALS — BP 108/70 | HR 88 | Ht 63.0 in | Wt 158.0 lb

## 2016-12-22 DIAGNOSIS — G40209 Localization-related (focal) (partial) symptomatic epilepsy and epileptic syndromes with complex partial seizures, not intractable, without status epilepticus: Secondary | ICD-10-CM | POA: Diagnosis not present

## 2016-12-22 DIAGNOSIS — Q909 Down syndrome, unspecified: Secondary | ICD-10-CM | POA: Diagnosis not present

## 2016-12-22 NOTE — Progress Notes (Signed)
NEUROLOGY CONSULTATION NOTE  Duke SalviaRichard M Leclaire MRN: [id number] DOB: 06/12/1971  Referring provider: Dr. Melene Planan Floyd (ER) Primary care provider: Dr. Eustaquio BoydenJavier Gutierrez  Reason for consult:  seizures  Dear Dr Sharen HonesGutierrez:  Thank you for your kind referral of Duke SalviaRichard M Eaddy for consultation of the above symptoms. Although his history is well known to you, please allow me to reiterate it for the purpose of our medical record. The patient was accompanied to the clinic by his sister who also provides collateral information. Records and images were personally reviewed where available.  HISTORY OF PRESENT ILLNESS: This is a 46 year old man with a history of Down syndrome, hyperlipidemia, and seizures since age 89, presenting to establish care. History is obtained from his sister due to the patient's cognitive status. He started having seizures at age 89 where he would be staring, jerking, "like a fish out of water," lasting 1-2 minutes, with associated tongue bite. Phenobarbital dose was increased each time, until dose was increased to 64.8mg  4 tabs qhs and seizures stopped at around age 46. He had been seizure-free until 12/12/15 as they were sitting in the car, he became unresponsive and fell over. He was cold and not breathing. His sister denies any body jerks, stating this episode was not like his prior seizures. She was instructed to do chest compressions. He came to after 3-4 minutes. [facility] where he was noted to be retching and vomiting tan-colored fluid. Bloodwork was unremarkable, phenobarbital level 40.4. CXR showed diffuse bilateral airspace disease probable pulmonary edema. 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 He was discharged home on doxycycline. He was event-free for almost a year until 11/21/16 on the [employer] bus from his day program when he slumped over and "jiggled  a little" for 1 minute. They sat him up and he completely went back out and was shaking "like a fish out of water" for 12-15 minutes. There was report of eye blinking on ER notes. He was back to baseline in the ER and when his sister arrived. He lives with his sister and her family, she administers his medications and denies any missed doses. Sleep is good, he takes Seroquel every night. He has had behavioral issues since childhood which are unchanged, no recent changes in the home except for his 6323-month old nephew. He has been taking gabapentin 200mg  BID for at least 2 years to "keep him on track" with the anxiety. His sister denies any increased anxiety recently. She denies any other episodes of unresponsiveness. Due to his cognitive status, unable to obtain much information about his symptoms. He denies any pain, difficulties with his extremities. He does state he cannot see out of the left eye. He has bilateral cataracts per sister. He goes to a day program daily.   Epilepsy Risk Factors:  Down syndrome. Otherwise he had a normal birth, no history of febrile convulsions, CNS infections such as meningitis/encephalitis, significant traumatic brain injury, neurosurgical procedures, or family history of seizures.  PAST MEDICAL HISTORY: Past Medical History:  Diagnosis Date  . Cataracts, bilateral   . Congenital heart defect   . Down syndrome   . HLD (hyperlipidemia)   . Seizure disorder (HCC)     PAST SURGICAL HISTORY: No past surgical history on file.  MEDICATIONS: Current Outpatient Prescriptions on File Prior to Visit  Medication Sig Dispense Refill  .  gabapentin (NEURONTIN) 100 MG capsule TAKE TWO CAPSULES BY MOUTH TWO TIMES A DAY (EVERY MORNING AND BEDTIME) 120 capsule 3  . PHENobarbital (LUMINAL) 64.8 MG tablet TAKE FOUR (4) TABLETS BY MOUTH AT BEDTIME 360 tablet 1  . polyethylene glycol (MIRALAX / GLYCOLAX) packet Take 17 g by mouth daily. (Patient taking differently: Take 8.5 g by mouth  as directed. Take on Sat ONLY.) 14 each 0  . pravastatin (PRAVACHOL) 10 MG tablet Take 1 tablet (10 mg total) by mouth daily. 90 tablet 3  . QUEtiapine (SEROQUEL) 300 MG tablet Take 600 mg by mouth at bedtime.    . vitamin B-12 (CYANOCOBALAMIN) 500 MCG tablet Take 500 mcg by mouth daily.     No current facility-administered medications on file prior to visit.     ALLERGIES: Allergies  Allergen Reactions  . Amoxicillin-Pot Clavulanate     unspecified  . Penicillins     Has patient had a PCN reaction causing immediate rash, facial/tongue/throat swelling, SOB or lightheadedness with hypotension: unknown Has patient had a PCN reaction causing severe rash involving mucus membranes or skin necrosis: unknown Has patient had a PCN reaction that required hospitalization: unknown Has patient had a PCN reaction occurring within the last 10 years: No If all of the above answers are "NO", then Piacentini proceed with Cephalosporin use.     FAMILY HISTORY: Family History  Problem Relation Age of Onset  . Diabetes Mother   . Hypertension Mother        Cerebral hemorrhage  . Diabetes Father   . Hypertension Father   . Coronary artery disease Father 64       MI  . Stroke Father   . Cancer Maternal Grandmother        Oral  . Cancer Maternal Aunt        Colon    SOCIAL HISTORY: Social History   Social History  . Marital status: Single    Spouse name: N/A  . Number of children: 0  . Years of education: N/A   Occupational History  . Working at "the [employer]    Social History Main Topics  . Smoking status: Never Smoker  . Smokeless tobacco: Never Used  . Alcohol use No  . Drug use: No  . Sexual activity: No   Other Topics Concern  . Not on file   Social History Narrative   Single   Lives with his sister and her family, 5 dogs, 1 cat, 4 goats, 20 chickens   [facility]   Diet: good water, fruits/vegetables daily    REVIEW OF SYSTEMS unable to obtain due to  cognitive impairment  PHYSICAL EXAM: Vitals:   12/22/16 0859  BP: 108/70  Pulse: 88   General: No acute distress, Down syndrome facies, edentulous Head:  Normocephalic/atraumatic Eyes: Fundoscopic exam shows bilateral sharp discs, no vessel changes, exudates, or hemorrhages Neck: supple, no paraspinal tenderness, full range of motion Back: No paraspinal tenderness Heart: regular rate and rhythm Lungs: Clear to auscultation bilaterally. Vascular: No carotid bruits. Skin/Extremities: No rash, no edema Neurological Exam: Mental status: alert and oriented to person, mild dysarthria, able to follow simple commands and answer simple questions, able to count fingers Cranial nerves: CN I: not tested CN II: pupils equal, round and reactive to light, visual fields intact, fundi unremarkable. CN III, IV, VI:  full range of motion, no nystagmus, no ptosis CN V: facial sensation intact CN VII: upper and lower face symmetric CN  VIII: hearing intact to finger rub CN IX, X: gag intact, uvula midline CN XI: sternocleidomastoid and trapezius muscles intact CN XII: tongue midline Bulk & Tone: normal, no fasciculations. Motor: 5/5 throughout with no pronator drift. Sensation: intact to light touch Deep Tendon Reflexes: +2 throughout, no ankle clonus Plantar responses: downgoing bilaterally Cerebellar: no incoordination on finger to nose Gait: slightly wide-based, no ataxia Tremor: none  IMPRESSION: This is a 46 year old man with a history of Down syndrome, hyperlipidemia, and seizures since age 46, seizure-free since age 71 on Phenobarbital 64.8mg  4 tabs qhs, who had an episode of unresponsiveness a year ago with no convulsive activity, then last month had 2 episodes in one day with described unresponsiveness and jerking. Seizures suggestive of focal to bilateral tonic-clonic seizures. Etiology of seizure recurrence after 20 years is unclear, his sister denies any recent changes in environment. MRI  brain with and without contrast will be ordered to assess for underlying structural abnormality. A 1-hour EEG will be done. Continue current doses of AEDs, after EEG we will plan to increase gabapentin dose. He was taking this for anxiety, but can be used for seizure prophylaxis as well. He does not drive. He will follow-up in 4 months and knows to call for any changes.   Thank you for allowing me to participate in the care of this patient. Please do not hesitate to call for any questions or concerns.   Patrcia Dolly, M.D.  CC: Dr. Sharen Hones, Dr. Adela Lank

## 2016-12-22 NOTE — Patient Instructions (Signed)
1. Schedule MRI brain with and without contrast 2. Schedule 1-hour EEG 3. Continue all your medications for now, our office will call with results and medication instructions after the tests 4. Follow-up in 4 months, call for any changes  Seizure Precautions: 1. If medication has been prescribed for you to prevent seizures, take it exactly as directed.  Do not stop taking the medicine without talking to your doctor first, even if you have not had a seizure in a long time.   2. Avoid activities in which a seizure would cause danger to yourself or to others.  Don't operate dangerous machinery, swim alone, or climb in high or dangerous places, such as on ladders, roofs, or girders.  Do not drive unless your doctor says you Croy.  3. If you have any warning that you Dimarzo have a seizure, lay down in a safe place where you can't hurt yourself.    4.  No driving for 6 months from last seizure, [place].   Please refer to the following link on the Epile[employer] Foundation of America's website for more information: http://www.epilepsyfoundation.org/answerplace/Social/driving/drivingu.cfm   5.  Maintain good sleep hygiene. Avoid alcohol.  6.  Contact your doctor if you have any problems that Lucien be related to the medicine you are taking.  7.  Call 911 and bring the patient back to the ED if:        A.  The seizure lasts longer than 5 minutes.       B.  The patient doesn't awaken shortly after the seizure  C.  The patient has new problems such as difficulty seeing, speaking or moving  D.  The patient was injured during the seizure  E.  The patient has a temperature over 102 F (39C)  F.  The patient vomited and now is having trouble breathing

## 2016-12-26 ENCOUNTER — Ambulatory Visit (INDEPENDENT_AMBULATORY_CARE_PROVIDER_SITE_OTHER): Payer: Medicare Other | Admitting: Neurology

## 2016-12-26 DIAGNOSIS — G40209 Localization-related (focal) (partial) symptomatic epilepsy and epileptic syndromes with complex partial seizures, not intractable, without status epilepticus: Secondary | ICD-10-CM

## 2016-12-27 DIAGNOSIS — F72 Severe intellectual disabilities: Secondary | ICD-10-CM | POA: Diagnosis not present

## 2016-12-27 DIAGNOSIS — F918 Other conduct disorders: Secondary | ICD-10-CM | POA: Diagnosis not present

## 2017-01-01 ENCOUNTER — Telehealth: Payer: Self-pay

## 2017-01-01 NOTE — Telephone Encounter (Signed)
-----   Message from Van ClinesKaren M Aquino, MD sent at 01/01/2017  9:26 AM EDT ----- Pls let her know the EEG did not show any seizure discharges, but it is not like a pregnancy test that is positive or negative. Proceed with MRI brain, then we will discuss next steps. Call for any changes. Thanks

## 2017-01-01 NOTE — Procedures (Signed)
ELECTROENCEPHALOGRAM REPORT  Date of Study: 12/26/2016  Patient's Name: Kenneth [name] MRN: [id number] Date of Birth: 01-02-1971  Referring Provider: Dr. [name]  Clinical History: This is a 46 year old man with Down syndrome and seizures, seizure-free for 20+ years until recent episodes of unresponsiveness with jerking.   Medications: Phenobarbital Neurontin Pravachol Seroquel  Technical Summary: A multichannel digital 1-hour EEG recording measured by the international 10-20 system with electrodes applied with paste and impedances below 5000 ohms performed as portable with EKG monitoring in an awake and asleep patient.  Hyperventilation was not performed. Photic stimulation was performed.  The digital EEG was referentially recorded, reformatted, and digitally filtered in a variety of bipolar and referential montages for optimal display.   Description: The patient is awake and asleep during the recording.  During maximal wakefulness, there is a symmetric, medium voltage 6.5-7 Hz posterior dominant rhythm that attenuates with eye opening. This is admixed with a large amount of diffuse 4-6 Hz theta slowing and occasional diffuse 3 Hz delta slowing of the waking background.  There is an excess amount of diffuse low voltage beta activity throughout the recording. During drowsiness and sleep, there is an increase in theta slowing of the background with higher amplitude activity seen, at times sharply contoured without clear epileptogenic potential. Poorly formed vertex waves were seen. Photic stimulation did not elicit any abnormalities.  There were no epileptiform discharges or electrographic seizures seen.    EKG lead was unremarkable.  Impression: This 1-hour awake and asleep EEG is abnormal due to the presence of: 1. Slowing of the posterior dominant rhythm 2. Mild diffuse slowing of the waking background 3. Excess diffuse beta activity  Clinical Correlation of the above findings  indicates diffuse cerebral dysfunction that is non-specific in etiology and can be seen with hypoxic/ischemic injury, toxic/metabolic encephalopathies, neurodegenerative disorders, or medication effect. Excess beta activity can be seen with benzodiazepine and barbiturate use.  The absence of epileptiform discharges does not rule out a clinical diagnosis of epilepsy.  Clinical correlation is advised.   [name], M.D.

## 2017-01-01 NOTE — Telephone Encounter (Signed)
LMOM relaying message below.  

## 2017-02-14 ENCOUNTER — Ambulatory Visit: Payer: Medicare Other | Attending: Audiology | Admitting: Audiology

## 2017-02-14 DIAGNOSIS — R292 Abnormal reflex: Secondary | ICD-10-CM | POA: Insufficient documentation

## 2017-02-14 DIAGNOSIS — H93293 Other abnormal auditory perceptions, bilateral: Secondary | ICD-10-CM | POA: Insufficient documentation

## 2017-02-14 DIAGNOSIS — H93233 Hyperacusis, bilateral: Secondary | ICD-10-CM | POA: Insufficient documentation

## 2017-02-14 DIAGNOSIS — Z01110 Encounter for hearing examination following failed hearing screening: Secondary | ICD-10-CM | POA: Diagnosis not present

## 2017-02-14 NOTE — Procedures (Signed)
  [facility] [address] [place], [place]  [address] [phone]  AUDIOLOGICAL EVALUATION   Name:  Kenneth [name] Date:  02/14/2017  DOB:   1970/08/12 Diagnoses: Abnormal hearing screen  MRN:   [id number] Referent: Eustaquio BoydenGutierrez, Javier, MD    HISTORY: Kenneth [name] was seen for an Audiological Evaluation following a failed hearing screen at the physicians office. His sister accompanied him and states that "he only responded to one tone".   His sister, Damien FusiKimberly Phillips states that he "says that he can't hear when you are talking to him".  There is no history of ear infections. There is no reported family history of hearing loss.  EVALUATION: Visual Reinforcement Audiometry (VRA) testing was conducted using fresh noise and warbled tones with inserts and repeated with headphones.  The results of the hearing test from 500Hz  - 8000Hz  result showed: . Hearing thresholds of 15-25 dBHL bilaterally - slightly poorer on the right side. Marland Kitchen. Speech detection levels were 20 dBHL in the right ear and 20dBHL in the left ear using recorded multitalker noise. . Localization skills were good at 40 dBHL using recorded multitalker noise - but Chritopher cringed, said "too loud" and appeared in pain at 40 dBHL.  . The reliability was good.    . Tympanometry showed normal volume and mobility (Type A) bilaterally. Ipsilateral acoustic reflexes are 90--95 dB on the left side and 95-100dB on the right side. Broad Band noise acoustic reflexes are 85dB on the right and 80 dB on the left.   . Otoscopic examination showed a visible tympanic membrane with good light reflex without redness     CONCLUSION: Kenneth [name] is difficult to test and he appears very sensitive to sound. Cringing and/or saying "too loud" at 65 dBHL on the right and 55 dBHL on the left - these levels are equivalent to normal conversational speech levels. He also startled at 40 dBHL at 4000Hz  in each ear using speech noise.   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  Please note that [name]'s sister is not aware of sound sensitivity, but this Caswell occur with long-standing abnormal auditory perception issues.  Although raising his hand, pushing a button or saying "yes" were tried, Kenneth [name] was able to participated with hearing testing used VRA test techniques with fair to good reliability.  Saintclair appears to have normal to near normal hearing thresholds bilaterally. The broad band acoustic reflexes are consistent with normal to near normal hearing thresholds bilaterally.   Sound sensitivity appears to start at volume equivalent to normal conversational speech levels. As discussed with the degree of abnormal auditory perception of loudness, auditory processing issues and/or other speech clarity issues are suspected, but cannot be evaluated because of [name]'s limited verbal skills.  Family education included discussion of the test results.    Recommendations:  Please continue to monitor speech and hearing at home.  Contact Eustaquio BoydenGutierrez, Javier, MD for any speech or hearing concerns including fever, pain when pulling ear gently, increased fussiness, dizziness or balance issues as well as any other concern about speech or hearing.   Please feel free to contact me if you have questions at [phone].  Felicia Bloomquist L. Kate SableWoodward, Au.D., CCC-A Doctor of Audiology   cc: Eustaquio BoydenGutierrez, Javier, MD

## 2017-02-15 ENCOUNTER — Other Ambulatory Visit: Payer: Self-pay | Admitting: Family Medicine

## 2017-02-15 NOTE — Telephone Encounter (Signed)
Last office visit 11/16/2016.  Last refilled?  Not sure if you are the one that normal prescribes his Seroquel.  Ok to refill?

## 2017-02-15 NOTE — Telephone Encounter (Signed)
Denied - I have not prescribed this in the past. Request needs to go to prescribing provider.

## 2017-03-12 ENCOUNTER — Other Ambulatory Visit: Payer: Self-pay | Admitting: Family Medicine

## 2017-03-12 NOTE — Telephone Encounter (Signed)
Last Rx 09/14/16 x706mos. Last OV 10/2016

## 2017-03-14 NOTE — Telephone Encounter (Signed)
Rx called in to requested pharmacy 

## 2017-03-14 NOTE — Telephone Encounter (Signed)
plz phone in. 

## 2017-05-02 ENCOUNTER — Ambulatory Visit: Payer: Medicare Other | Admitting: Neurology

## 2017-05-09 ENCOUNTER — Emergency Department (HOSPITAL_COMMUNITY)
Admission: EM | Admit: 2017-05-09 | Discharge: 2017-05-09 | Disposition: A | Discharge status: Home / Self Care | Payer: Medicare Other | Attending: Emergency Medicine | Admitting: Emergency Medicine

## 2017-05-09 ENCOUNTER — Encounter (HOSPITAL_COMMUNITY): Payer: Self-pay

## 2017-05-09 ENCOUNTER — Emergency Department (HOSPITAL_COMMUNITY): Payer: Medicare Other

## 2017-05-09 DIAGNOSIS — R9431 Abnormal electrocardiogram [ECG] [EKG]: Secondary | ICD-10-CM | POA: Diagnosis not present

## 2017-05-09 DIAGNOSIS — Z7902 Long term (current) use of antithrombotics/antiplatelets: Secondary | ICD-10-CM | POA: Diagnosis not present

## 2017-05-09 DIAGNOSIS — Z79899 Other long term (current) drug therapy: Secondary | ICD-10-CM | POA: Insufficient documentation

## 2017-05-09 DIAGNOSIS — R569 Unspecified convulsions: Secondary | ICD-10-CM | POA: Diagnosis not present

## 2017-05-09 DIAGNOSIS — R55 Syncope and collapse: Secondary | ICD-10-CM | POA: Diagnosis not present

## 2017-05-09 DIAGNOSIS — Q909 Down syndrome, unspecified: Secondary | ICD-10-CM | POA: Insufficient documentation

## 2017-05-09 DIAGNOSIS — G40909 Epilepsy, unspecified, not intractable, without status epilepticus: Secondary | ICD-10-CM | POA: Diagnosis not present

## 2017-05-09 LAB — BASIC METABOLIC PANEL
ANION GAP: 7 (ref 5–15)
BUN: 16 mg/dL (ref 6–20)
CHLORIDE: 102 mmol/L (ref 101–111)
CO2: 30 mmol/L (ref 22–32)
CREATININE: 1.31 mg/dL — AB (ref 0.61–1.24)
Calcium: 9 mg/dL (ref 8.9–10.3)
GFR calc non Af Amer: >60 mL/min (ref >60)
Glucose, Bld: 112 mg/dL — ABNORMAL HIGH (ref 65–99)
POTASSIUM: 5.1 mmol/L (ref 3.5–5.1)
Sodium: 139 mmol/L (ref 135–145)

## 2017-05-09 LAB — CBC
HEMATOCRIT: 39.8 % (ref 39.0–52.0)
HEMOGLOBIN: 13.5 g/dL (ref 13.0–17.0)
MCH: 34.6 pg — ABNORMAL HIGH (ref 26.0–34.0)
MCHC: 33.9 g/dL (ref 30.0–36.0)
MCV: 102.1 fL — ABNORMAL HIGH (ref 78.0–100.0)
Platelets: 222 10*3/uL (ref 150–400)
RBC: 3.9 MIL/uL — AB (ref 4.22–5.81)
RDW: 14.3 % (ref 11.5–15.5)
WBC: 8.8 10*3/uL (ref 4.0–10.5)

## 2017-05-09 MED ORDER — MORPHINE SULFATE (PF) 4 MG/ML IV SOLN
4.0000 mg | Freq: Once | INTRAVENOUS | Status: AC
  Administered 2017-05-09: 4 mg via INTRAVENOUS
  Filled 2017-05-09: qty 1

## 2017-05-09 MED ORDER — ACETAMINOPHEN 325 MG PO TABS
650.0000 mg | ORAL_TABLET | Freq: Once | ORAL | Status: AC
  Administered 2017-05-09: 650 mg via ORAL
  Filled 2017-05-09: qty 2

## 2017-05-09 MED ORDER — KETOROLAC TROMETHAMINE 30 MG/ML IJ SOLN
30.0000 mg | Freq: Once | INTRAMUSCULAR | Status: AC
  Administered 2017-05-09: 30 mg via INTRAVENOUS
  Filled 2017-05-09: qty 1

## 2017-05-09 NOTE — ED Notes (Signed)
Bed: WA13 Expected date:  Expected time:  Means of arrival:  Comments: EMS seizure 

## 2017-05-09 NOTE — ED Notes (Signed)
Patient stated, "i have chest pain."
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
## 2017-05-29 ENCOUNTER — Encounter: Payer: Self-pay | Admitting: Neurology

## 2017-05-29 ENCOUNTER — Ambulatory Visit (INDEPENDENT_AMBULATORY_CARE_PROVIDER_SITE_OTHER): Payer: Medicare Other | Admitting: Neurology

## 2017-05-29 VITALS — BP 118/84 | HR 83 | Wt 150.0 lb

## 2017-05-29 DIAGNOSIS — G40209 Localization-related (focal) (partial) symptomatic epilepsy and epileptic syndromes with complex partial seizures, not intractable, without status epilepticus: Secondary | ICD-10-CM

## 2017-05-29 DIAGNOSIS — Q909 Down syndrome, unspecified: Secondary | ICD-10-CM | POA: Diagnosis not present

## 2017-05-29 MED ORDER — GABAPENTIN 100 MG PO CAPS: ORAL_CAPSULE | ORAL | 11 refills | Status: DC

## 2017-05-29 NOTE — Patient Instructions (Signed)
1. Increase Gabapentin 100mg : take 2 caps in AM, 3 caps in PM 2. Continue Phenobarbital 64.8mg : take 4 tabs at night 3. Follow-up in 6 months, call for any changes  Seizure Precautions: 1. If medication has been prescribed for you to prevent seizures, take it exactly as directed.  Do not stop taking the medicine without talking to your doctor first, even if you have not had a seizure in a long time.   2. Avoid activities in which a seizure would cause danger to yourself or to others.  Don't operate dangerous machinery, swim alone, or climb in high or dangerous places, such as on ladders, roofs, or girders.  Do not drive unless your doctor says you Harren.  3. If you have any warning that you Caya have a seizure, lay down in a safe place where you can't hurt yourself.    4.  No driving for 6 months from last seizure, as per [place] [place].   Please refer to the following link on the Epilepsy Foundation of America's website for more information: http://www.epilepsyfoundation.org/answerplace/Social/driving/drivingu.cfm   5.  Maintain good sleep hygiene.   6.  Contact your doctor if you have any problems that Spivak be related to the medicine you are taking.  7.  Call 911 and bring the patient back to the ED if:        A.  The seizure lasts longer than 5 minutes.       B.  The patient doesn't awaken shortly after the seizure  C.  The patient has new problems such as difficulty seeing, speaking or moving  D.  The patient was injured during the seizure  E.  The patient has a temperature over 102 F (39C)  F.  The patient vomited and now is having trouble breathing

## 2017-05-29 NOTE — Progress Notes (Signed)
NEUROLOGY FOLLOW UP OFFICE NOTE  Zurich Carreno Hallstrom [id number] 17-Apr-1971  HISTORY OF PRESENT ILLNESS: I had the pleasure of seeing Vong Hosie in follow-up in the neurology clinic on 05/29/2017.  The patient was last seen 5 months ago for seizures. He is again accompanied by his sister who helps supplement the history today.  Records and images were personally reviewed where available.  His 1-hour awake and asleep EEG did not show any epileptiform changes, there was mild diffuse background slowing and excess diffuse beta activity seen. He was unable to do the MRI. He was brought to the ER last 05/09/17 after an episode of unresponsiveness while he was on the bus to his day program. His sister received a call that he was unresponsive but does not know much of the details. Per ER note, the driver noted he was slumped over. EMS reported 2 seizures lasting 20 seconds each (eyes fluttering for each), given IV Versed. Bloodwork showed slightly elevated creatinine 1.31. Note of MCV 102.1, Hct 39.8. His B12 in April was 786. He had a head CT with no acute changes seen. His EKG reported sinus rhythm, ventricular premature complex aberrant conduction SV complexes, abnormal R-wave progression, early transition, borderline ST elevation anterior leads. He is taking Phenobarbital 64.8mg  4 tabs qhs, last Phenobarbital level in [place] 2017 was 36.3. He is also on Gabapentin 200mg  BID for anxiety. His sister reports this is overall controlled, he has good and bad days. He denies any headaches, dizziness, focal numbness/tingling/weakness. His sister has not noticed any further unresponsive episodes or eye fluttering episodes.  HPI 12/22/2016: This is a 46 yo man with a history of Down syndrome, hyperlipidemia, and seizures since age 37. History is obtained from his sister due to the patient's cognitive status. He started having seizures at age 62 where he would be staring, jerking, "like a fish out of water," lasting 1-2 minutes,  with associated tongue bite. Phenobarbital dose was increased each time, until dose was increased to 64.8mg  4 tabs qhs and seizures stopped at around age 69. He had been seizure-free until 12/12/15 as they were sitting in the car, he became unresponsive and fell over. He was cold and not breathing. His sister denies any body jerks, stating this episode was not like his prior seizures. She was instructed to do chest compressions. He came to after 3-4 minutes. He was brought to [facility] ER where he was noted to be retching and vomiting tan-colored fluid. Bloodwork was unremarkable, phenobarbital level 40.4. CXR showed diffuse bilateral airspace disease probable pulmonary edema. 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 He was discharged home on doxycycline. He was event-free for almost a year until 11/21/16 on the [employer] bus from his day program when he slumped over and "jiggled a little" for 1 minute. They sat him up and he completely went back out and was shaking "like a fish out of water" for 12-15 minutes. There was report of eye blinking on ER notes. He was back to baseline in the ER and when his sister arrived. He lives with his sister and her family, she administers his medications and denies any missed doses. Sleep is good, he takes Seroquel every night. He has had behavioral issues since childhood which are unchanged, no recent changes in the home except for his 37-month old nephew. He has been taking gabapentin 200mg  BID for at least  2 years to "keep him on track" with the anxiety. His sister denies any increased anxiety recently. She denies any other episodes of unresponsiveness. Due to his cognitive status, unable to obtain much information about his symptoms. He denies any pain, difficulties with his extremities. He does state he cannot see out of the left eye. He has bilateral cataracts per  sister. He goes to a day program daily.   Epilepsy Risk Factors:  Down syndrome. Otherwise he had a normal birth, no history of febrile convulsions, CNS infections such as meningitis/encephalitis, significant traumatic brain injury, neurosurgical procedures, or family history of seizures.  PAST MEDICAL HISTORY: Past Medical History:  Diagnosis Date  . Cataracts, bilateral   . Congenital heart defect   . Down syndrome   . HLD (hyperlipidemia)   . Seizure disorder [facility])     MEDICATIONS: Current Outpatient Prescriptions on File Prior to Visit  Medication Sig Dispense Refill  . gabapentin (NEURONTIN) 100 MG capsule TAKE TWO CAPSULES BY MOUTH TWO TIMES A DAY (EVERY MORNING AND BEDTIME) 120 capsule 3  . PHENobarbital (LUMINAL) 64.8 MG tablet TAKE FOUR (4) TABLETS BY MOUTH AT BEDTIME 360 tablet 1  . pravastatin (PRAVACHOL) 10 MG tablet Take 1 tablet (10 mg total) by mouth daily. (Patient taking differently: Take 10 mg by mouth at bedtime. ) 90 tablet 3  . QUEtiapine (SEROQUEL) 300 MG tablet Take 600 mg by mouth at bedtime.    . vitamin B-12 (CYANOCOBALAMIN) 500 MCG tablet Take 500 mcg by mouth daily with breakfast.      No current facility-administered medications on file prior to visit.     ALLERGIES: Allergies  Allergen Reactions  . Amoxicillin-Pot Clavulanate Palpitations    Has patient had a PCN reaction causing immediate rash, facial/tongue/throat swelling, SOB or lightheadedness with hypotension: No Has patient had a PCN reaction causing severe rash involving mucus membranes or skin necrosis: No Has patient had a PCN reaction that required hospitalization: No Has patient had a PCN reaction occurring within the last 10 years: No If all of the above answers are "NO", then [place] proceed with Cephalosporin use.   Marland Kitchen Penicillins Rash    Has patient had a PCN reaction causing immediate rash, facial/tongue/throat swelling, SOB or lightheadedness with hypotension: Yes Has patient had a PCN  reaction causing severe rash involving mucus membranes or skin necrosis: Yes Has patient had a PCN reaction that required hospitalization: Unknown Has patient had a PCN reaction occurring within the last 10 years: No If all of the above answers are "NO", then Bennette proceed with Cephalosporin use.     FAMILY HISTORY: Family History  Problem Relation Age of Onset  . Diabetes Mother   . Hypertension Mother        Cerebral hemorrhage  . Diabetes Father   . Hypertension Father   . Coronary artery disease Father 47       MI  . Stroke Father   . Cancer Maternal Grandmother        Oral  . Cancer Maternal Aunt        Colon    SOCIAL HISTORY: Social History   Social History  . Marital status: Single    Spouse name: N/A  . Number of children: 0  . Years of education: N/A   Occupational History  . Working at "the [employer]    Social History Main Topics  . Smoking status: Never Smoker  . Smokeless tobacco: Never Used  . Alcohol use No  . Drug  use: No  . Sexual activity: No   Other Topics Concern  . Not on file   Social History Narrative   Single   Lives with his sister and her family, 5 dogs, 1 cat, 4 goats, 20 chickens   [facility]: [facility]   Diet: good water, fruits/vegetables daily    REVIEW OF SYSTEMS unable to obtain due to mental status, answers "no" to all questions  PHYSICAL EXAM: Vitals:   05/29/17 1524  BP: 118/84  Pulse: 83  SpO2: 91%   General: No acute distress, Down syndrome facies, edentulous Head:  Normocephalic/atraumatic Neck: supple, no paraspinal tenderness, full range of motion Back: No paraspinal tenderness Heart: regular rate and rhythm Lungs: Clear to auscultation bilaterally. Vascular: No carotid bruits. Skin/Extremities: No rash, no edema Neurological Exam: Mental status: alert and oriented to person, mild dysarthria, able to follow simple commands and answer simple questions, able to count fingers (similar to prior) Cranial  nerves: CN I: not tested CN II: pupils equal, round and reactive to light, visual fields intact CN III, IV, VI:  full range of motion, no nystagmus, no ptosis CN V: facial sensation intact CN VII: upper and lower face symmetric CN VIII: hearing intact to finger rub CN IX, X: gag intact, uvula midline CN XI: sternocleidomastoid and trapezius muscles intact CN XII: tongue midline Bulk & Tone: normal, no fasciculations. Motor: 5/5 throughout with no pronator drift. Sensation: intact to light touch Deep Tendon Reflexes: +2 throughout, no ankle clonus Plantar responses: downgoing bilaterally Cerebellar: no incoordination on finger to nose Gait: slightly wide-based, no ataxia Tremor: none  IMPRESSION: This is a 46 yo man with a history of Down syndrome, hyperlipidemia, and seizures since age 89, seizure-free since age 46 on Phenobarbital 64.8mg  4 tabs qhs, who had an episode of unresponsiveness in [place] 2017 with no convulsive activity, then in April 2018 had 2 episodes in one day with described unresponsiveness and jerking. His EEG showed mild diffuse slowing, head CT no acute changes. He had 2 more episodes of being slumped over while again in the bus last 05/09/17. He is taking Phenobarbital 64.8mg  4 tabs qhs and will increase Gabapentin to 200mg  in AM, 300mg  in PM. With recurrent syncope, would also recommend cardiology evaluation. He will follow-up in 6 months and knows to call for any changes.   Thank you for allowing me to participate in his care.  Please do not hesitate to call for any questions or concerns.  The duration of this appointment visit was 25 minutes of face-to-face time with the patient.  Greater than 50% of this time was spent in counseling, explanation of diagnosis, planning of further management, and coordination of care.   Patrcia DollyKaren Umeka Wrench, M.D.   CC: Dr. Sharen HonesGutierrez

## 2017-05-30 ENCOUNTER — Encounter: Payer: Self-pay | Admitting: Neurology

## 2017-05-31 ENCOUNTER — Telehealth: Payer: Self-pay | Admitting: Neurology

## 2017-05-31 NOTE — Telephone Encounter (Signed)
Left VM on both number's for sister Selena BattenKim listed. If he has not seen a cardiologist in the past, would like to refer him as well, since patients with Down syndrome Kenneth [name] have cardiac issues which can also cause them to pass out.

## 2017-06-14 ENCOUNTER — Other Ambulatory Visit: Payer: Self-pay | Admitting: Family Medicine

## 2017-06-14 NOTE — Telephone Encounter (Signed)
plz phone in. 

## 2017-06-14 NOTE — Telephone Encounter (Signed)
Last filled:  05/14/17, #630 Last OV (M[facility]):  11/16/16 Next OV:  none

## 2017-06-15 NOTE — Telephone Encounter (Signed)
Refill left on vm at pharmacy per Dr. G.   

## 2017-06-19 ENCOUNTER — Other Ambulatory Visit: Payer: Self-pay

## 2017-06-19 DIAGNOSIS — R55 Syncope and collapse: Secondary | ICD-10-CM

## 2017-06-19 NOTE — Telephone Encounter (Signed)
Meagen, I had tried to contact his sister Selena BattenKim previously but no reply. Can you pls follow-up and ask if he has seen a cardiologist for the passing out episodes? Aside from seizures, heart issues can also cause people to pass out, especially in Down syndrome patients. Thanks

## 2017-06-19 NOTE — Telephone Encounter (Signed)
Spoke with pt's sister, Selena BattenKim, who states that pt has not seen a cardiologist.  She did say that she was going to contact "Dr. Reece AgarG" at some point for a referral to one though.  Let her know that I can send referral now.

## 2017-06-19 NOTE — Telephone Encounter (Signed)
Agree, thanks

## 2017-07-04 ENCOUNTER — Encounter: Payer: Self-pay | Admitting: Interventional Cardiology

## 2017-07-04 ENCOUNTER — Ambulatory Visit (INDEPENDENT_AMBULATORY_CARE_PROVIDER_SITE_OTHER): Payer: Medicare Other | Admitting: Interventional Cardiology

## 2017-07-04 VITALS — BP 100/72 | HR 76 | Ht 63.0 in | Wt 159.6 lb

## 2017-07-04 DIAGNOSIS — R55 Syncope and collapse: Secondary | ICD-10-CM | POA: Diagnosis not present

## 2017-07-04 DIAGNOSIS — G40909 Epilepsy, unspecified, not intractable, without status epilepticus: Secondary | ICD-10-CM

## 2017-07-04 NOTE — Progress Notes (Signed)
Cardiology Office Note    Date:  07/04/2017   ID:  Kenneth [name], DOB 1971-02-07, MRN [id number]  PCP:  Eustaquio BoydenGutierrez, Javier, MD  Cardiologist: Lesleigh NoeHenry W Jaquayla Hege III, MD   Chief Complaint  Patient presents with  . Loss of Consciousness    History of Present Illness:  Kenneth [name] is a 46 y.o. male referred by Dr. Patrcia DollyKaren Aquino for evaluation of recurrent loss of consciousness.  He has a seizure disorder.  The patient has decreased cognitive function.  He has a chronic seizure disorder.  Over the past several months patient has had at least 2 episodes of staring and presumed loss of consciousness.  Description also associated with seizure-like activity although the sister states that he had at least one staring without motor activity.  Neurology is referred the patient to exclude the possibility of cardiac.  Patient is.  Persistent history of heart problems, "a hole in the heart".  Past Medical History:  Diagnosis Date  . Cataracts, bilateral   . Congenital heart defect   . Down syndrome   . HLD (hyperlipidemia)   . Seizure disorder [facility](HCC)     History reviewed. No pertinent surgical history.  Current Medications: Outpatient Medications Prior to Visit  Medication Sig Dispense Refill  . gabapentin (NEURONTIN) 100 MG capsule Take 200 mg by mouth every morning and 300 mg by mouth every evening.    Marland Kitchen. PHENobarbital (LUMINAL) 64.8 MG tablet TAKE FOUR (4) TABLETS BY MOUTH AT BEDTIME 360 tablet 1  . pravastatin (PRAVACHOL) 10 MG tablet Take 1 tablet (10 mg total) by mouth daily. (Patient taking differently: Take 10 mg by mouth at bedtime. ) 90 tablet 3  . QUEtiapine (SEROQUEL) 300 MG tablet Take 600 mg by mouth at bedtime.    . vitamin B-12 (CYANOCOBALAMIN) 500 MCG tablet Take 500 mcg by mouth daily with breakfast.     . gabapentin (NEURONTIN) 100 MG capsule Take 2 caps in AM, 3 caps in PM (Patient not taking: Reported on 07/04/2017) 150 capsule 11   No facility-administered medications  prior to visit.      Allergies:   Amoxicillin-pot clavulanate and Penicillins   Social History   Socioeconomic History  . Marital status: Single    Spouse name: None  . Number of children: 0  . Years of education: None  . Highest education level: None  Social Needs  . Financial resource strain: None  . Food insecurity - worry: None  . Food insecurity - inability: None  . Transportation needs - medical: None  . Transportation needs - non-medical: None  Occupational History  . Occupation: Working at "the [employer]"  Tobacco Use  . Smoking status: Never Smoker  . Smokeless tobacco: Never Used  Substance and Sexual Activity  . Alcohol use: No  . Drug use: No  . Sexual activity: No  Other Topics Concern  . None  Social History Narrative   Single   Lives with his sister and her family, 5 dogs, 1 cat, 4 goats, 20 chickens   Activity: [facility] to [employer] twice a [facility]   Diet: good water, fruits/vegetables daily     Family History:  The patient's family history includes Cancer in his maternal aunt and maternal grandmother; Coronary artery disease (age of onset: 8555) in his father; Diabetes in his father and mother; Hypertension in his father and mother; Stroke in his father.   ROS:   Please see the history of present illness.    Decreased communication.  No complaints voiced. All other systems reviewed and are negative.   PHYSICAL EXAM:   VS:  BP 100/72   Pulse 76   Ht 5\' 3"  (1.6 m)   Wt 159 lb 9.6 oz (72.4 kg)   BMI 28.27 kg/m    GEN: Well nourished, well developed, in no acute distress .  Facial features chromosomal abnormality. HEENT: normal  Neck: no JVD, carotid bruits, or masses Cardiac: RRR; no murmurs, rubs, or gallops,no edema  Respiratory:  clear to auscultation bilaterally, normal work of breathing GI: soft, nontender, nondistended, + BS MS: no deformity or atrophy  Skin: warm and dry, no rash Neuro:  Alert and Oriented x 3, Strength and sensation are intact.  Decreased  spontaneous communication.  No focal deficits. Psych: euthymic mood, full affect  Wt Readings from Last 3 Encounters:  07/04/17 159 lb 9.6 oz (72.4 kg)  05/29/17 150 lb (68 kg)  12/22/16 158 lb (71.7 kg)      Studies/Labs Reviewed:   EKG:  EKG from 05/09/2017 reveals normal sinus rhythm, early QRS transition, and otherwise unremarkable.  Recent Labs: 11/13/2016: ALT 8 05/09/2017: BUN 16; Creatinine, Ser 1.31; Hemoglobin 13.5; Platelets 222; Potassium 5.1; Sodium 139   Lipid Panel    Component Value Date/Time   CHOL 186 11/13/2016 0849   TRIG 69.0 11/13/2016 0849   HDL 57.40 11/13/2016 0849   CHOLHDL 3 11/13/2016 0849   VLDL 13.8 11/13/2016 0849   LDLCALC 115 (H) 11/13/2016 0849   LDLDIRECT 106.6 04/18/2010 1104    Additional studies/ records that were reviewed today include:  none    ASSESSMENT:    1. Syncope, unspecified syncope type   2. Seizure disorder (HCC)      PLAN:  In order of problems listed above:  1. It is unclear if the patient is actually having syncope.  We have ordered a 2D Doppler echocardiogram and 30-day continuous monitor to help answer this question. 2. This probably will continue to be managed by neurology.  PRN follow-up if we identify some abnormality.  Medication Adjustments/Labs and Tests Ordered: Current medicines are reviewed at length with the patient today.  Concerns regarding medicines are outlined above.  Medication changes, Labs and Tests ordered today are listed in the Patient Instructions below. Patient Instructions  Medication Instructions:  Your physician recommends that you continue on your current medications as directed. Please refer to the Current Medication list given to you today.  Labwork: None  Testing/Procedures: Your physician has requested that you have an echocardiogram. Echocardiography is a painless test that uses sound waves to create images of your heart. It provides your doctor with information about the  size and shape of your heart and how well your heart's chambers and valves are working. This procedure takes approximately one hour. There are no restrictions for this procedure.  Your physician has recommended that you wear an event monitor. Event monitors are medical devices that record the heart's electrical activity. Doctors most often [place] these monitors to diagnose arrhythmias. Arrhythmias are problems with the speed or rhythm of the heartbeat. The monitor is a small, portable device. You can wear one while you do your normal daily activities. This is usually used to diagnose what is causing palpitations/syncope (passing out).   Follow-Up: Your physician recommends that you schedule a follow-up appointment as needed with Dr. Katrinka Blazing.    Any Other Special Instructions Will Be Listed Below (If Applicable).     If you need a refill on your cardiac medications  before your next appointment, please call your pharmacy.      Signed, Lesleigh NoeHenry W Gilad Dugger III, MD  07/04/2017 3:37 PM    [facility] [address], [place], [place]  [address] Phone: [phone]; Fax: [phone]

## 2017-07-04 NOTE — Patient Instructions (Signed)
Medication Instructions:  Your physician recommends that you continue on your current medications as directed. Please refer to the Current Medication list given to you today.  Labwork: None  Testing/Procedures: Your physician has requested that you have an echocardiogram. Echocardiography is a painless test that uses sound waves to create images of your heart. It provides your doctor with information about the size and shape of your heart and how well your heart's chambers and valves are working. This procedure takes approximately one hour. There are no restrictions for this procedure.  Your physician has recommended that you wear an event monitor. Event monitors are medical devices that record the heart's electrical activity. Doctors most often us these monitors to diagnose arrhythmias. Arrhythmias are problems with the speed or rhythm of the heartbeat. The monitor is a small, portable device. You can wear one while you do your normal daily activities. This is usually used to diagnose what is causing palpitations/syncope (passing out).   Follow-Up: Your physician recommends that you schedule a follow-up appointment as needed with Dr. Smith.    Any Other Special Instructions Will Be Listed Below (If Applicable).     If you need a refill on your cardiac medications before your next appointment, please call your pharmacy.   

## 2017-07-18 ENCOUNTER — Ambulatory Visit (HOSPITAL_COMMUNITY): Payer: Medicare Other | Attending: Internal Medicine

## 2017-07-18 ENCOUNTER — Other Ambulatory Visit: Payer: Self-pay

## 2017-07-18 ENCOUNTER — Ambulatory Visit (INDEPENDENT_AMBULATORY_CARE_PROVIDER_SITE_OTHER): Payer: Medicare Other

## 2017-07-18 DIAGNOSIS — I081 Rheumatic disorders of both mitral and tricuspid valves: Secondary | ICD-10-CM | POA: Diagnosis not present

## 2017-07-18 DIAGNOSIS — R55 Syncope and collapse: Secondary | ICD-10-CM

## 2017-07-27 ENCOUNTER — Emergency Department (HOSPITAL_COMMUNITY): Payer: Medicare Other

## 2017-07-27 ENCOUNTER — Other Ambulatory Visit: Payer: Self-pay

## 2017-07-27 ENCOUNTER — Emergency Department (HOSPITAL_COMMUNITY)
Admission: EM | Admit: 2017-07-27 | Discharge: 2017-07-27 | Disposition: A | Discharge status: Home / Self Care | Payer: Medicare Other | Attending: Emergency Medicine | Admitting: Emergency Medicine

## 2017-07-27 DIAGNOSIS — R079 Chest pain, unspecified: Secondary | ICD-10-CM | POA: Insufficient documentation

## 2017-07-27 DIAGNOSIS — R109 Unspecified abdominal pain: Secondary | ICD-10-CM | POA: Diagnosis not present

## 2017-07-27 DIAGNOSIS — F840 Autistic disorder: Secondary | ICD-10-CM | POA: Diagnosis not present

## 2017-07-27 DIAGNOSIS — Q909 Down syndrome, unspecified: Secondary | ICD-10-CM | POA: Diagnosis not present

## 2017-07-27 DIAGNOSIS — R110 Nausea: Secondary | ICD-10-CM | POA: Diagnosis not present

## 2017-07-27 DIAGNOSIS — J189 Pneumonia, unspecified organism: Secondary | ICD-10-CM | POA: Diagnosis not present

## 2017-07-27 DIAGNOSIS — R1084 Generalized abdominal pain: Secondary | ICD-10-CM | POA: Diagnosis not present

## 2017-07-27 DIAGNOSIS — Z79899 Other long term (current) drug therapy: Secondary | ICD-10-CM | POA: Diagnosis not present

## 2017-07-27 DIAGNOSIS — J181 Lobar pneumonia, unspecified organism: Secondary | ICD-10-CM | POA: Diagnosis not present

## 2017-07-27 DIAGNOSIS — R55 Syncope and collapse: Secondary | ICD-10-CM | POA: Diagnosis not present

## 2017-07-27 DIAGNOSIS — R918 Other nonspecific abnormal finding of lung field: Secondary | ICD-10-CM | POA: Diagnosis not present

## 2017-07-27 DIAGNOSIS — R112 Nausea with vomiting, unspecified: Secondary | ICD-10-CM | POA: Diagnosis not present

## 2017-07-27 LAB — CBC WITH DIFFERENTIAL/PLATELET
BASOS PCT: 1 %
Basophils Absolute: 0.1 10*3/uL (ref 0.0–0.1)
EOS ABS: 0 10*3/uL (ref 0.0–0.7)
EOS PCT: 0 %
HCT: 40.3 % (ref 39.0–52.0)
HEMOGLOBIN: 13.5 g/dL (ref 13.0–17.0)
Lymphocytes Relative: 22 %
Lymphs Abs: 1.1 10*3/uL (ref 0.7–4.0)
MCH: 34.4 pg — AB (ref 26.0–34.0)
MCHC: 33.5 g/dL (ref 30.0–36.0)
MCV: 102.5 fL — ABNORMAL HIGH (ref 78.0–100.0)
MONOS PCT: 7 %
Monocytes Absolute: 0.4 10*3/uL (ref 0.1–1.0)
NEUTROS PCT: 70 %
Neutro Abs: 3.5 10*3/uL (ref 1.7–7.7)
PLATELETS: 238 10*3/uL (ref 150–400)
RBC: 3.93 MIL/uL — AB (ref 4.22–5.81)
RDW: 14.5 % (ref 11.5–15.5)
WBC: 5 10*3/uL (ref 4.0–10.5)

## 2017-07-27 LAB — COMPREHENSIVE METABOLIC PANEL
ALBUMIN: 3.5 g/dL (ref 3.5–5.0)
ALK PHOS: 127 U/L — AB (ref 38–126)
ALT: 12 U/L — ABNORMAL LOW (ref 17–63)
ANION GAP: 7 (ref 5–15)
AST: 17 U/L (ref 15–41)
BUN: 14 mg/dL (ref 6–20)
CALCIUM: 8.4 mg/dL — AB (ref 8.9–10.3)
CHLORIDE: 104 mmol/L (ref 101–111)
CO2: 24 mmol/L (ref 22–32)
Creatinine, Ser: 1.08 mg/dL (ref 0.61–1.24)
GFR calc non Af Amer: >60 mL/min (ref >60)
GLUCOSE: 100 mg/dL — AB (ref 65–99)
Potassium: 4.9 mmol/L (ref 3.5–5.1)
SODIUM: 135 mmol/L (ref 135–145)
Total Bilirubin: 0.3 mg/dL (ref 0.3–1.2)
Total Protein: 6.9 g/dL (ref 6.5–8.1)

## 2017-07-27 LAB — URINALYSIS, ROUTINE W REFLEX MICROSCOPIC
Bilirubin Urine: NEGATIVE
Glucose, UA: NEGATIVE mg/dL
Hgb urine dipstick: NEGATIVE
Ketones, ur: NEGATIVE mg/dL
LEUKOCYTES UA: NEGATIVE
NITRITE: NEGATIVE
PROTEIN: NEGATIVE mg/dL
SPECIFIC GRAVITY, URINE: 1.014 (ref 1.005–1.030)
pH: 7 (ref 5.0–8.0)

## 2017-07-27 LAB — I-STAT TROPONIN, ED: Troponin i, poc: 0 ng/mL (ref 0.00–0.08)

## 2017-07-27 LAB — LIPASE, BLOOD: LIPASE: 22 U/L (ref 11–51)

## 2017-07-27 MED ORDER — ONDANSETRON HCL 4 MG/2ML IJ SOLN
4.0000 mg | Freq: Once | INTRAMUSCULAR | Status: AC
  Administered 2017-07-27: 4 mg via INTRAVENOUS
  Filled 2017-07-27: qty 2

## 2017-07-27 MED ORDER — SODIUM CHLORIDE 0.9 % IV BOLUS (SEPSIS)
1000.0000 mL | Freq: Once | INTRAVENOUS | Status: AC
  Administered 2017-07-27: 1000 mL via INTRAVENOUS

## 2017-07-27 MED ORDER — IOPAMIDOL (ISOVUE-300) INJECTION 61%
INTRAVENOUS | Status: AC
  Administered 2017-07-27: 100 mL
  Filled 2017-07-27: qty 100

## 2017-07-27 MED ORDER — DOXYCYCLINE HYCLATE 100 MG PO CAPS: 100.0000 mg | ORAL_CAPSULE | Freq: Two times a day (BID) | ORAL | 0 refills | Status: DC

## 2017-07-27 MED ORDER — SODIUM CHLORIDE 0.9 % IV SOLN
INTRAVENOUS | Status: DC
  Administered 2017-07-27 (×2): via INTRAVENOUS

## 2017-07-27 MED ORDER — ONDANSETRON 4 MG PO TBDP: 4.0000 mg | ORAL_TABLET | Freq: Three times a day (TID) | ORAL | 0 refills | Status: DC | PRN

## 2017-07-27 NOTE — ED Triage Notes (Addendum)
Pt arrived via [facility] after having cp while riding [employer] bus. EMS states pt denied cp upon their arrival on scene. Pt was able to walk to ambulance but had a syncopal episode while walking. EMS states episode lasted approx 10 seconds and awoke after being placed supine. Pt has Autism and is wearing a LifeAlert necklace. No other hx available to EMS. Sister is POA, according to [employer] bus driver. She is on her way to ED, according to EMS. Pt is alert and denies cp at time of triage.

## 2017-07-27 NOTE — ED Notes (Signed)
PO challenge completed; pt tolerated well.

## 2017-07-27 NOTE — ED Notes (Signed)
Patient transported to X-ray 

## 2017-07-27 NOTE — ED Notes (Signed)
Got patient undress on the monitor did ekg shown to Dr Tegler patient is resting with call bell in reach 

## 2017-07-27 NOTE — ED Notes (Signed)
Pt resting with sister at bedside. Patient presents with facial grimacing and glances at his sister when asked if in pain. Pt states pain is 10/10 in chest and abd area; pt stated he had to vomit and begin to dry heave while looking at sister. No emesis noted. Pt states abd hurts when touched and has hyperactive BS. Urine collected

## 2017-07-27 NOTE — ED Notes (Signed)
Attempted to obtain urine sample, Pt not able to provide one at this time. Urinal given and Pt's family instructed to obtain one as soon as possible.

## 2017-07-27 NOTE — ED Notes (Signed)
Pt discharged from ED; instructions provided and scripts given; Pt encouraged to return to ED if symptoms worsen and to f/u with PCP; Pt verbalized understanding of all instructions 

## 2017-07-27 NOTE — ED Provider Notes (Signed)
[facility] EMERGENCY DEPARTMENT Provider Note   CSN: [id number] Arrival date & time: 07/27/17  1004     History   Chief Complaint Chief Complaint  Patient presents with  . Loss of Consciousness    HPI Kenneth [name] is a 46 y.o. male with a PMHx of Down's syndrome, Autism, congenital heart defect, cataracts, seizures, and other conditions listed below, who presents to the ED via EMS with complaints of possible syncopal event. Level 5 caveat due to mental delay and pt being unreliable historian, some information provided by EMS, and some by sister who was not present at the time of the incident.  Per EMS, patient called complaining of chest pain, upon their arrival he denied chest pain however he had a possible syncopal event for approximately 10 seconds, was laid down in a supine position and immediately came back to baseline.  Patient sister states that he did not want to get up this morning and was very late getting out of bed, but he was not complaining of anything in particular.  She left before he went to the bus so she is not sure whether he ate his breakfast or not.  The next thing she knows was that she received a phone call stating that they had transported him here because he had possibly passed out on the bus.  No seizure-like activity was reported to her however she is not sure whether this [place] have occurred.  The patient complains of abdominal pain, nausea, and left shoulder pain.  He also states that his arm feels asleep.  Lastly he complains of chest pain.  He answers yes to all questions, so it is difficult to assess what answers are correct and what are him simply stating yes.  Sister denies that he has had any fevers, cough, URI symptoms, diarrhea, constipation, vomiting, or any other complaints or conditions recently.  However the patient states that he had diarrhea once this morning but he cannot describe what it looks like.  The sister states that he  occasionally has a tendency of making up symptoms or complaints and she is not sure how much of this is accurate.  She denies any known sick contacts, recent travel, prior abdominal surgeries, or suspicious food intake.  Sister reports that he is at his mental baseline.  Remainder of HPI and ROS is limited due to unreliable history and pt's mental delay.   The history is provided by the patient, medical records, a relative and the EMS personnel. The history is limited by a developmental delay. No language interpreter was used.  Loss of Consciousness   This is a new problem. The current episode started less than 1 hour ago. The problem occurs rarely. The problem has been resolved. He lost consciousness for a period of less than one minute. The problem is associated with an unknown factor. Associated symptoms include abdominal pain, chest pain and nausea. Pertinent negatives include confusion, fever, seizures and vomiting. He has tried bed rest for the symptoms. The treatment provided significant relief. His past medical history is significant for seizures.    Past Medical History:  Diagnosis Date  . Cataracts, bilateral   . Congenital heart defect   . Down syndrome   . HLD (hyperlipidemia)   . Seizure disorder [facility])     Patient Active Problem List   Diagnosis Date Noted  . Localization-related symptomatic epilepsy and epileptic syndromes with complex partial seizures, not intractable, without status epilepticus (HCC) 12/22/2016  .  Hearing trouble, bilateral 11/16/2016  . 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 DDD (degenerative disc disease), lumbar 08/08/2016  . Constipation 08/08/2016  . Acute pulmonary edema (HCC) 12/15/2015  . Advanced care planning/counseling discussion 07/13/2014  . Medicare annual wellness visit, subsequent 07/11/2013  . Down syndrome   . Seizure disorder (HCC)   . Vitamin B12 deficiency 04/22/2010  . HLD (hyperlipidemia) 02/07/2007  . ACNE VARIOLIFORMIS 02/07/2007    No  past surgical history on file.     Home Medications    Prior to Admission medications   Medication Sig Start Date End Date Taking? Authorizing Provider  gabapentin (NEURONTIN) 100 MG capsule Take 200 mg by mouth every morning and 300 mg by mouth every evening.    [provider]  PHENobarbital (LUMINAL) 64.8 MG tablet TAKE FOUR (4) TABLETS BY MOUTH AT BEDTIME 06/14/17   Eustaquio Boyden, MD  pravastatin (PRAVACHOL) 10 MG tablet Take 1 tablet (10 mg total) by mouth daily. Patient taking differently: Take 10 mg by mouth at bedtime.  11/16/16   Eustaquio Boyden, MD  QUEtiapine (SEROQUEL) 300 MG tablet Take 600 mg by mouth at bedtime.    [provider]  vitamin B-12 (CYANOCOBALAMIN) 500 MCG tablet Take 500 mcg by mouth daily with breakfast.     [provider]    Family History Family History  Problem Relation Age of Onset  . Diabetes Mother   . Hypertension Mother        Cerebral hemorrhage  . Diabetes Father   . Hypertension Father   . Coronary artery disease Father 61       MI  . Stroke Father   . Cancer Maternal Grandmother        Oral  . Cancer Maternal Aunt        Colon    Social History Social History   Tobacco Use  . Smoking status: Never Smoker  . Smokeless tobacco: Never Used  Substance Use Topics  . Alcohol use: No  . Drug use: No     Allergies   Amoxicillin-pot clavulanate and Penicillins   Review of Systems Review of Systems  Unable to perform ROS: Other  Constitutional: Negative for fever.  HENT: Negative for rhinorrhea.   Respiratory: Negative for cough.   Cardiovascular: Positive for chest pain and syncope.  Gastrointestinal: Positive for abdominal pain, diarrhea (possibly) and nausea. Negative for vomiting.  Allergic/Immunologic: Negative for immunocompromised state.  Neurological: Positive for syncope. Negative for seizures.  Psychiatric/Behavioral: Negative for confusion.   Level 5 caveat due to mental  delay  Physical Exam Updated Vital Signs BP 134/89   Pulse 74   Temp 98.6 F (37 C) (Oral)   Resp 13   SpO2 95%   Physical Exam  Constitutional: Vital signs are normal. He appears well-developed and well-nourished.  Non-toxic appearance. No distress.  Afebrile, nontoxic, NAD, dry heaving occasionally, not producing vomitus  HENT:  Head: Normocephalic and atraumatic.  Mouth/Throat: Oropharynx is clear and moist and mucous membranes are normal.  Eyes: Conjunctivae and EOM are normal. Pupils are equal, round, and reactive to light. Right eye exhibits no discharge. Left eye exhibits no discharge.  PERRL, tracking normally  Neck: Normal range of motion. Neck supple.  Cardiovascular: Normal rate, regular rhythm, normal heart sounds and intact distal pulses. Exam reveals no gallop and no friction rub.  No murmur heard. Pulmonary/Chest: Effort normal and breath sounds normal. No respiratory distress. He has no decreased breath sounds. He has no wheezes. He has no rhonchi.  He has no rales.  Abdominal: Soft. Normal appearance and bowel sounds are normal. He exhibits no distension. There is generalized tenderness. There is no rigidity, no rebound, no guarding, no CVA tenderness, no tenderness at McBurney's point and negative Murphy's sign.  Soft, nondistended, +BS throughout, with diffuse abdominal TTP, no r/g/r, neg murphy's, neg mcburney's, no CVA TTP   Musculoskeletal: Normal range of motion.  MAE x4 Strength and sensation grossly intact Distal pulses intact  Neurological: He is alert. He has normal strength. No sensory deficit.  Answers yes to all questions Full neuro exam limited due to pt dry heaving, uncooperativeness, and unreliability  Skin: Skin is warm, dry and intact. No rash noted.  Psychiatric: He has a normal mood and affect.  Nursing note and vitals reviewed.    ED Treatments / Results  Labs (all labs ordered are listed, but only abnormal results are displayed) Labs  Reviewed  CBC WITH DIFFERENTIAL/PLATELET - Abnormal; Notable for the following components:      Result Value   RBC 3.93 (*)    MCV 102.5 (*)    MCH 34.4 (*)    All other components within normal limits  COMPREHENSIVE METABOLIC PANEL - Abnormal; Notable for the following components:   Glucose, Bld 100 (*)    Calcium 8.4 (*)    ALT 12 (*)    Alkaline Phosphatase 127 (*)    All other components within normal limits  URINALYSIS, ROUTINE W REFLEX MICROSCOPIC - Abnormal; Notable for the following components:   Color, Urine STRAW (*)    All other components within normal limits  LIPASE, BLOOD  I-STAT TROPONIN, ED    EKG  EKG Interpretation  Date/Time:  Friday July 27 2017 10:14:45 EST Ventricular Rate:  72 PR Interval:    QRS Duration: 92 QT Interval:  385 QTC Calculation: 422 R Axis:   17 Text Interpretation:  Sinus rhythm Abnormal R-wave progression, early transition When compared to prior, no significant changes seen.  No STEMI Confirmed by Theda Belfastegeler, Chris ([id number]) on 07/27/2017 11:38:02 AM       Radiology Dg Chest 2 View  Result Date: 07/27/2017 CLINICAL DATA:  Emesis started few hrs ago ,sob , EXAM: CHEST  2 VIEW COMPARISON:  12/14/2015 FINDINGS: Shallow lung inflation. Heart size is accentuated by technique. There is mild patchy opacity left lung base. No pulmonary edema. No pleural effusions. Visualized bowel gas pattern is nonobstructive. No free intraperitoneal air beneath the diaphragm. An electronic device overlies the upper chest. IMPRESSION: Left lower lobe infiltrate. Electronically Signed   By: Norva PavlovElizabeth  Brown M.D.   On: 07/27/2017 11:52   Ct Head Wo Contrast  Result Date: 07/27/2017 CLINICAL DATA:  Syncope. EXAM: CT HEAD WITHOUT CONTRAST TECHNIQUE: Contiguous axial images were obtained from the base of the skull through the vertex without intravenous contrast. COMPARISON:  05/09/2017 FINDINGS: Brain: There is no evidence for acute hemorrhage, hydrocephalus,  mass lesion, or abnormal extra-axial fluid collection. No definite CT evidence for acute infarction. Vascular: No hyperdense vessel or unexpected calcification. Skull: No evidence for fracture. No worrisome lytic or sclerotic lesion. Sinuses/Orbits: Chronic opacification right maxillary sinus again noted. Hypoplastic right mastoid air cells are similar to prior. Stable appearance anterior aspect of the right medial orbital wall, potentially from remote trauma. Other: None. IMPRESSION: 1. Stable exam.  No acute intracranial abnormality. Electronically Signed   By: Kennith CenterEric  Mansell M.D.   On: 07/27/2017 14:30   Ct Abdomen Pelvis W Contrast  Result Date: 07/27/2017 CLINICAL DATA:  Nausea vomiting, abdominal pain EXAM: CT ABDOMEN AND PELVIS WITH CONTRAST TECHNIQUE: Multidetector CT imaging of the abdomen and pelvis was performed using the standard protocol following bolus administration of intravenous contrast. CONTRAST:  ISOVUE-300 IOPAMIDOL (ISOVUE-300) INJECTION 61% COMPARISON:  None. FINDINGS: Lower chest: Respiratory motion degrades imaging. No gross abnormality) Hepatobiliary: No focal hepatic lesion. No biliary duct dilatation. Gallbladder is normal. Common bile duct is normal. Pancreas: Pancreas is normal. No ductal dilatation. No pancreatic inflammation. Spleen: Normal spleen Adrenals/urinary tract: Adrenal glands and kidneys are normal. Extrarenal pelves. No ureteral obstruction. Distended bladder arises to the level of the umbilicus (sagittal image 62, series 7) Stomach/Bowel: Stomach, small bowel, appendix, and cecum are normal. The colon and rectosigmoid colon are normal. Vascular/Lymphatic: Abdominal aorta is normal caliber. There is no retroperitoneal or periportal lymphadenopathy. No pelvic lymphadenopathy. Reproductive: Prostate normal Other: No free fluid. Musculoskeletal: No aggressive osseous lesion. Degenerate sclerosis of the lower lumbar spine. IMPRESSION: 1. Exam is suboptimal due to  respiratory motion. 2. Distended bladder. 3. No acute findings in the abdomen or pelvis otherwise. Electronically Signed   By: Genevive Bi M.D.   On: 07/27/2017 14:38    Procedures Procedures (including critical care time)  Medications Ordered in ED Medications  sodium chloride 0.9 % bolus 1,000 mL (0 mLs Intravenous Stopped 07/27/17 1140)    And  0.9 %  sodium chloride infusion ( Intravenous New Bag/Given 07/27/17 1501)  ondansetron (ZOFRAN) injection 4 mg (4 mg Intravenous Given 07/27/17 1130)  iopamidol (ISOVUE-300) 61 % injection (100 mLs  Contrast Given 07/27/17 1404)     Initial Impression / Assessment and Plan / ED Course  I have reviewed the triage vital signs and the nursing notes.  Pertinent labs & imaging results that were available during my care of the patient were reviewed by me and considered in my medical decision making (see chart for details).     46 y.o. male here with ?syncopal event, unclear exactly what happened because pt is very poor historian. Says yes to everything. Per EMS, pt had 10 second syncopal event, came back to baseline immediately after that once he was placed in supine. Pt c/o abd pain, L shoulder pain/paresthesias, nausea, possibly diarrhea, and CP. Per sister, pt will "lie" and make stuff up; and he says yes to everything. On exam, diffuse abdominal tenderness, dry heaving during exam, extremities NVI and with symmetric strength. Per sister, pt is at mental baseline. Very difficult to determine exactly what he's having and what he's just saying yes to. Will get labs, EKG, CXR, and CT abd/pelv to evaluate for possible source of his complaints. Will give fluids and zofran, then reassess. Discussed case with my attending Dr. Rush Landmark who agrees with plan.   11:27 AM Called into room for possible seizure; pt making purposeful shaking movements, blinks to threat, looks towards [place] when spoken to. Doesn't appear to be having seizure. Will add-on CT head  and reassess shortly.   4:17 PM CBC w/diff essentially unremarkable. CMP with chronically marginally elevated AlkPhos, otherwise unremarkable. Lipase WNL. U/A WNL. Trop neg. EKG unchanged from prior, no acute ischemic findings. CXR showing mild patchy opacity in left lung base, although he has no cough, he's complaining of CP on the left side; will treat for PNA. CT head negative for acute findings. CT abd/pelv negative for acute findings except for a distended bladder (he has since urinated).  Pt doing better and tolerating PO well, he denies any ongoing complaints at this time. Will  send home with tx for CAP as well as zofran rx, advised tylenol/motrin for pain, adequate hydration and rest, and f/up closely with PCP in 3-5 days for recheck. I explained the diagnosis and have given explicit precautions to return to the ER including for any other new or worsening symptoms. The pt's sister/POA understands and accepts the medical plan as it's been dictated and I have answered their questions. Discharge instructions concerning home care and prescriptions have been given. The patient is STABLE and is discharged to home in good condition.    Final Clinical Impressions(s) / ED Diagnoses   Final diagnoses:  Syncope, unspecified syncope type  Generalized abdominal pain  Nausea  Chest pain, unspecified type  Community acquired pneumonia of left lower lobe of lung [facility](HCC)    ED Discharge Orders        Ordered    doxycycline (VIBRAMYCIN) 100 MG capsule  2 times daily     07/27/17 1616    ondansetron (ZOFRAN ODT) 4 MG disintegrating tablet  Every 8 hours PRN     07/27/17 [address]1616       Erasto Sleight, YankeetownMercedes, [place] JerseyPA-C 07/27/17 1618    Tegeler, Canary Brimhristopher J, MD 07/28/17 0000

## 2017-07-27 NOTE — Discharge Instructions (Signed)
Your work up today revealed that you have pneumonia. Take antibiotics as directed until completed. Stay well hydrated. Alternate between tylenol and motrin as needed for pain. Use zofran as directed as needed for nausea. Follow up with your regular doctor in 3-5 days for recheck of symptoms and ongoing management of your conditions. Return to the ER for emergent changes or worsening symptoms.

## 2017-08-15 ENCOUNTER — Other Ambulatory Visit: Payer: Self-pay | Admitting: Family Medicine

## 2017-08-15 NOTE — Telephone Encounter (Signed)
Last OV (CPE):  11/16/16 Next OV:  None

## 2017-10-19 ENCOUNTER — Ambulatory Visit: Payer: Medicare Other | Admitting: Neurology

## 2017-12-03 ENCOUNTER — Ambulatory Visit (INDEPENDENT_AMBULATORY_CARE_PROVIDER_SITE_OTHER): Payer: Medicare Other | Admitting: Neurology

## 2017-12-03 ENCOUNTER — Encounter: Payer: Self-pay | Admitting: Neurology

## 2017-12-03 ENCOUNTER — Other Ambulatory Visit: Payer: Self-pay

## 2017-12-03 VITALS — BP 100/72 | HR 69 | Ht 62.0 in | Wt 160.0 lb

## 2017-12-03 DIAGNOSIS — R55 Syncope and collapse: Secondary | ICD-10-CM | POA: Diagnosis not present

## 2017-12-03 DIAGNOSIS — G40209 Localization-related (focal) (partial) symptomatic epilepsy and epileptic syndromes with complex partial seizures, not intractable, without status epilepticus: Secondary | ICD-10-CM

## 2017-12-03 DIAGNOSIS — Q909 Down syndrome, unspecified: Secondary | ICD-10-CM

## 2017-12-03 MED ORDER — GABAPENTIN 100 MG PO CAPS: ORAL_CAPSULE | ORAL | 11 refills | Status: DC

## 2017-12-03 MED ORDER — PHENOBARBITAL 64.8 MG PO TABS: ORAL_TABLET | ORAL | 5 refills | Status: DC

## 2017-12-03 NOTE — Progress Notes (Signed)
NEUROLOGY FOLLOW UP OFFICE NOTE  Kenneth [name] [id number] 1971-05-04  HISTORY OF PRESENT ILLNESS: I had the pleasure of seeing Kenneth [name] in follow-up in the neurology clinic on 12/03/2017.  The patient was last seen 6 months ago for seizures. He is again accompanied by his sister who helps supplement the history today.  Records and images were personally reviewed where available. His 1-hour awake and asleep EEG did not show any epileptiform changes, there was mild diffuse background slowing and excess diffuse beta activity seen. He was unable to do the MRI. He has had several episodes of unresponsiveness. He had one in October 2018 where he was slumped over in the bus. EMS reported 2 seizures lasting 20 seconds each (eyes fluttering for each), given IV Versed. Bloodwork showed slightly elevated creatinine 1.31. Note of MCV 102.1, Hct 39.8. His B12 in April was 786. He had a head CT with no acute changes seen. His EKG reported sinus rhythm, ventricular premature complex aberrant conduction SV complexes, abnormal R-wave progression, early transition, borderline ST elevation anterior leads. Gabapentin dose was increased to  in AM,  in PM. He is also taking Phenobarbital 64.8mg  4 tabs qhs. Since his last visit, he has kindly been evaluated by Cardiology, he has had an echocardiogram and a holter monitor. He was unable to tolerate the holter monitor, with <50% of 30-day recording showing no abnormal rhythm. He was back in the ER on 07/27/17 for another episode of had another episode of loss of consciousness. He was complaining of chest pain, on EMS arrival he denied this then had a possible syncopal episode for approximately 10 seconds where he was laid supine and immediately came back to baseline. Head CT unremarkable. He was also reporting abdominal pain, nausea, and left shoulder pain. His chest xray showed a left lower lobe infiltrate and he was discharged home on antibiotic.  His sister  denies any further syncopal episodes or seizures since December 2018. He continues to take Phenobarbital 64.8mg  4 tabs qhs and Gabapentin  in AM,  in PM without side effects. His sister reports his behavioral issues are not any worse. He denies any headaches, dizziness, focal numbness/tingling/weakness. His sister has not noticed any further unresponsive episodes or eye fluttering episodes.  HPI 12/22/2016: This is a 47 yo man with a history of Down syndrome, hyperlipidemia, and seizures since age 14. History is obtained from his sister due to the patient's cognitive status. He started having seizures at age 35 where he would be staring, jerking, "like a fish out of water," lasting 1-2 minutes, with associated tongue bite. Phenobarbital dose was increased each time, until dose was increased to 64.8mg  4 tabs qhs and seizures stopped at around age 36. He had been seizure-free until 12/12/15 as they were sitting in the car, he became unresponsive and fell over. He was cold and not breathing. His sister denies any body jerks, stating this episode was not like his prior seizures. She was instructed to do chest compressions. He came to after 3-4 minutes. He was brought to [facility] ER where he was noted to be retching and vomiting tan-colored fluid. Bloodwork was unremarkable, phenobarbital level 40.4. CXR showed diffuse bilateral airspace disease probable pulmonary edema. 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 He was discharged home on doxycycline. He was event-free for almost a year until 11/21/16 on the [employer] bus from his  day program when he slumped over and "jiggled a little" for 1 minute. They sat him up and he completely went back out and was shaking "like a fish out of water" for 12-15 minutes. There was report of eye blinking on ER notes. He was back to baseline in the ER and when his sister  arrived. He lives with his sister and her family, she administers his medications and denies any missed doses. Sleep is good, he takes Seroquel every night. He has had behavioral issues since childhood which are unchanged, no recent changes in the home except for his 59-month old nephew. He has been taking gabapentin  BID for at least 2 years to "keep him on track" with the anxiety. His sister denies any increased anxiety recently. She denies any other episodes of unresponsiveness. Due to his cognitive status, unable to obtain much information about his symptoms. He denies any pain, difficulties with his extremities. He does state he cannot see out of the left eye. He has bilateral cataracts per sister. He goes to a day program daily.   Epilepsy Risk Factors:  Down syndrome. Otherwise he had a normal birth, no history of febrile convulsions, CNS infections such as meningitis/encephalitis, significant traumatic brain injury, neurosurgical procedures, or family history of seizures.  PAST MEDICAL HISTORY: Past Medical History:  Diagnosis Date  . Cataracts, bilateral   . Congenital heart defect   . Down syndrome   . HLD (hyperlipidemia)   . Seizure disorder [facility])     MEDICATIONS: Current Outpatient Medications on File Prior to Visit  Medication Sig Dispense Refill  . doxycycline (VIBRAMYCIN) 100 MG capsule Take 1 capsule (100 mg total) by mouth 2 (two) times daily. One po bid x 7 days 14 capsule 0  . gabapentin (NEURONTIN) 100 MG capsule Take 200 mg by mouth every morning and 300 mg by mouth every evening.    . ondansetron (ZOFRAN ODT) 4 MG disintegrating tablet Take 1 tablet (4 mg total) by mouth every 8 (eight) hours as needed for nausea or vomiting. 15 tablet 0  . PHENobarbital (LUMINAL) 64.8 MG tablet TAKE FOUR (4) TABLETS BY MOUTH AT BEDTIME 360 tablet 1  . pravastatin (PRAVACHOL) 10 MG tablet Take 1 tablet (10 mg total) by mouth daily. (Patient taking differently: Take 10 mg by mouth at  bedtime. ) 90 tablet 3  . QUEtiapine (SEROQUEL) 300 MG tablet TAKE TWO TABLETS BY MOUTH EVERY NIGHT AT BEDTIME 60 tablet 6  . vitamin B-12 (CYANOCOBALAMIN) 500 MCG tablet Take 500 mcg by mouth daily with breakfast.      No current facility-administered medications on file prior to visit.     ALLERGIES: Allergies  Allergen Reactions  . Amoxicillin-Pot Clavulanate Palpitations    Has patient had a PCN reaction causing immediate rash, facial/tongue/throat swelling, SOB or lightheadedness with hypotension: No Has patient had a PCN reaction causing severe rash involving mucus membranes or skin necrosis: No Has patient had a PCN reaction that required hospitalization: No Has patient had a PCN reaction occurring within the last 10 years: No If all of the above answers are "NO", then [place] proceed with Cephalosporin use.   Marland Kitchen Penicillins Rash    Has patient had a PCN reaction causing immediate rash, facial/tongue/throat swelling, SOB or lightheadedness with hypotension: Yes Has patient had a PCN reaction causing severe rash involving mucus membranes or skin necrosis: Yes Has patient had a PCN reaction that required hospitalization: Unknown Has patient had a PCN reaction occurring within the last  10 years: No If all of the above answers are "NO", then [place] proceed with Cephalosporin use.     FAMILY HISTORY: Family History  Problem Relation Age of Onset  . Diabetes Mother   . Hypertension Mother        Cerebral hemorrhage  . Diabetes Father   . Hypertension Father   . Coronary artery disease Father 74       MI  . Stroke Father   . Cancer Maternal Grandmother        Oral  . Cancer Maternal Aunt        Colon    SOCIAL HISTORY: Social History   Socioeconomic History  . Marital status: Single    Spouse name: Not on file  . Number of children: 0  . Years of education: Not on file  . Highest education level: Not on file  Occupational History  . Occupation: Working at "the [employer]    Social Needs  . Financial resource strain: Not on file  . Food insecurity:    Worry: Not on file    Inability: Not on file  . Transportation needs:    Medical: Not on file    Non-medical: Not on file  Tobacco Use  . Smoking status: Never Smoker  . Smokeless tobacco: Never Used  Substance and Sexual Activity  . Alcohol use: No  . Drug use: No  . Sexual activity: Never  Lifestyle  . Physical activity:    Days per week: Not on file    Minutes per session: Not on file  . Stress: Not on file  Relationships  . Social connections:    Talks on phone: Not on file    Gets together: Not on file    Attends religious service: Not on file    Active member of club or organization: Not on file    Attends meetings of clubs or organizations: Not on file    Relationship status: Not on file  . Intimate partner violence:    Fear of current or ex partner: Not on file    Emotionally abused: Not on file    Physically abused: Not on file    Forced sexual activity: Not on file  Other Topics Concern  . Not on file  Social History Narrative   Single   Lives with his sister and her family, 5 dogs, 1 cat, 4 goats, 20 chickens   [facility]   Diet: good water, fruits/vegetables daily    REVIEW OF SYSTEMS unable to obtain due to mental status, answers "no" to all questions  PHYSICAL EXAM: Vitals:   12/03/17 1459  BP: 100/72  Pulse: 69  SpO2: 98%   General: No acute distress, Down syndrome facies, edentulous Head:  Normocephalic/atraumatic Neck: supple, no paraspinal tenderness, full range of motion Back: No paraspinal tenderness Heart: regular rate and rhythm Lungs: Clear to auscultation bilaterally. Vascular: No carotid bruits. Skin/Extremities: No rash, no edema Neurological Exam: Mental status: alert and oriented to person, mild dysarthria, able to follow simple commands and answer simple questions, able to count fingers (similar to prior) Cranial nerves: CN I:  not tested CN II: pupils equal, round and reactive to light, visual fields intact CN III, IV, VI:  full range of motion, no nystagmus, no ptosis CN V: facial sensation intact CN VII: upper and lower face symmetric CN VIII: hearing intact to finger rub CN IX, X: gag intact, uvula midline CN XI: sternocleidomastoid and trapezius muscles  intact CN XII: tongue midline Bulk & Tone: normal, no fasciculations. Motor: 5/5 throughout with no pronator drift. Sensation: intact to light touch Deep Tendon Reflexes: +2 throughout, no ankle clonus Plantar responses: downgoing bilaterally Cerebellar: no incoordination on finger to nose Gait: slightly wide-based, no ataxia Tremor: none  IMPRESSION: This is a 47 yo man with a history of Down syndrome, hyperlipidemia, and seizures since age 35, seizure-free since age 1 on Phenobarbital 64.8mg  4 tabs qhs and Gabapentin  in AM,  in PM. He had an episode of unresponsiveness in [place] 2017 with no convulsive activity, then in April 2018 had 2 episodes in one day with described unresponsiveness and jerking. His EEG showed mild diffuse slowing, head CT no acute changes. He has had 3 more episodes of brief syncope, most recently in 06/2017. Cardiac workup unremarkable, he had a 30-day holter however with only <50% readable study with no arrhythmia seen. Continue current medications. His sister was instructed to bring lab slip for phenobarbital level for his next blood draw with PCP. He will follow-up in 6 months and knows to call for any changes.   Thank you for allowing me to participate in his care.  Please do not hesitate to call for any questions or concerns.  The duration of this appointment visit was 25 minutes of face-to-face time with the patient.  Greater than 50% of this time was spent in counseling, explanation of diagnosis, planning of further management, and coordination of care.   Patrcia Dolly, M.D.   CC: Dr. Sharen Hones

## 2017-12-03 NOTE — Patient Instructions (Signed)
1. Have Phenobarbital level done with your next blood draw at Dr. Timoteo Expose office 2. Continue Phenobarbital and Gabapentin as prescribed 3. Follow-up in 6 months, call for any changes  Seizure Precautions: 1. If medication has been prescribed for you to prevent seizures, take it exactly as directed.  Do not stop taking the medicine without talking to your doctor first, even if you have not had a seizure in a long time.   2. Avoid activities in which a seizure would cause danger to yourself or to others.  Don't operate dangerous machinery, swim alone, or climb in high or dangerous places, such as on ladders, roofs, or girders.  Do not drive unless your doctor says you [place].  3. If you have any warning that you Cress have a seizure, lay down in a safe place where you can't hurt yourself.    4.  No driving for 6 months from last seizure, as per [facility].   Please refer to the following link on the Epilepsy Foundation of America's website for more information: http://www.epilepsyfoundation.org/answerplace/Social/driving/drivingu.cfm   5.  Maintain good sleep hygiene. Avoid alcohol.  6.  Contact your doctor if you have any problems that Herda be related to the medicine you are taking.  7.  Call 911 and bring the patient back to the ED if:        A.  The seizure lasts longer than 5 minutes.       B.  The patient doesn't awaken shortly after the seizure  C.  The patient has new problems such as difficulty seeing, speaking or moving  D.  The patient was injured during the seizure  E.  The patient has a temperature over 102 F (39C)  F.  The patient vomited and now is having trouble breathing

## 2017-12-17 ENCOUNTER — Ambulatory Visit: Payer: Self-pay | Admitting: *Deleted

## 2017-12-17 ENCOUNTER — Other Ambulatory Visit: Payer: Self-pay | Admitting: Family Medicine

## 2017-12-17 NOTE — Telephone Encounter (Signed)
Left message for pt's sister, Cala Bradford (on dpr) to call back.  Need to inform her we are working on pt's refills and per our policy it can take up to 72 hrs.

## 2017-12-17 NOTE — Telephone Encounter (Signed)
Last filled:  11/17/17, #120 Last OV (CPE):  11/16/16 Next OV:  None  I accidentally printed this rx.  Are you going to fax it and I shred printed copy?

## 2017-12-17 NOTE — Telephone Encounter (Signed)
PTs sister called to check on refill status she is at pharmacy now

## 2017-12-17 NOTE — Telephone Encounter (Signed)
Damien Fusi is at the pharmacy to get [facility] medications.   He is out of the phenobarbital and needs it for tonight.    In helping her locate the status of the refill I noticed  That Dr. Patrcia Dolly the neurologist prescribes this medication.   It was refilled on 12/03/17 the same day he had an office visit with Dr. Karel Jarvis.    Cala Bradford is going to call Dr. Rosalyn Gess office.  Also about the pravastatin..    Reason for Disposition . [1] Request for URGENT new prescription or refill of "essential" medication (i.e., likelihood of harm to patient if not taken) AND [2] triager unable to fill per unit policy    At pharmacy requesting phenobarbital and pravastatin but especially the phenobarbital because he is completely out.   Dr. Patrcia Dolly prescribes this medication so Cala Bradford is going to call Dr. Rosalyn Gess office.  Answer Assessment - Initial Assessment Questions 1. SYMPTOMS: "Do you have any symptoms?"     Damien Fusi called in requesting a refill on the phenobarbital and pravastatin but especially the phenobarbital because Benicio is out of this medication.    She is at the pharmacy now. I checked on the status and it looks like it was filled on 12/03/17 by Dr. Patrcia Dolly, neurologist.  The day Terris was seen for an office visit there. 2. SEVERITY: If symptoms are present, ask "Are they mild, moderate or severe?"     Cala Bradford is going to call Dr. Rosalyn Gess  Office for the refill since Dr. Karel Jarvis is the doctor that prescribes this medication.  Protocols used: MEDICATION QUESTION CALL-A-AH

## 2017-12-17 NOTE — Addendum Note (Signed)
Addended by: Nanci Pina on: 12/17/2017 03:10 PM   Modules accepted: Orders

## 2017-12-20 MED ORDER — PHENOBARBITAL 64.8 MG PO TABS: ORAL_TABLET | ORAL | 5 refills | Status: DC

## 2017-12-20 NOTE — Telephone Encounter (Signed)
No, I actually meant e-scribe vs fax.  I shred the printed rx.

## 2017-12-20 NOTE — Telephone Encounter (Signed)
Sent electronically. Was there a request to fax it?

## 2018-03-18 ENCOUNTER — Other Ambulatory Visit: Payer: Self-pay | Admitting: Family Medicine

## 2018-03-18 NOTE — Telephone Encounter (Signed)
Patient has made an appt for 8/26 for his Pravastatin Sodium 10 MG refill, but he is completely out of medication.  So they will need a refill for a week until his appt.

## 2018-03-18 NOTE — Telephone Encounter (Signed)
Refill of Seroquel  LOV 11/16/16  [facility]  [facility]  6 refills   Pt has appt 03/25/18 Requesting refill until appt.

## 2018-03-18 NOTE — Telephone Encounter (Signed)
Seroquel Last filled:  02/16/18, #60 Last OV (CPE):  11/16/16 Next OV:  none

## 2018-03-19 ENCOUNTER — Other Ambulatory Visit: Payer: Self-pay | Admitting: Family Medicine

## 2018-03-19 MED ORDER — PRAVASTATIN SODIUM 10 MG PO TABS: 10.0000 mg | ORAL_TABLET | Freq: Every day | ORAL | 0 refills | Status: DC

## 2018-03-19 NOTE — Telephone Encounter (Signed)
Copied from CRM [id number]. Topic: Quick Communication - Rx Refill/Question >> Mar 19, 2018  4:28 PM Arlyss Gandyichardson, Runa Whittingham N, NT wrote: Medication: QUEtiapine (SEROQUEL) 300 MG tablet   Has the patient contacted their pharmacy? Yes.   (Agent: If no, request that the patient contact the pharmacy for the refill.) (Agent: If yes, when and what did the pharmacy advise?)  Preferred Pharmacy (with phone number or street name): [facility] [place] [id number] [phone] (Phone) [phone] (Fax)    Agent: Please be advised that RX refills Feld take up to 3 business days. We ask that you follow-up with your pharmacy.

## 2018-03-19 NOTE — Telephone Encounter (Signed)
Refill for seroquel is pending from 03/18/18 note.

## 2018-03-19 NOTE — Telephone Encounter (Signed)
Note    Refill of Seroquel  LOV 11/16/16  Dr. Sharen [facility]onesGutierrez  [facility]  #60  6 refills   Pt has appt 03/25/18 Requesting refill until appt.

## 2018-03-25 ENCOUNTER — Encounter: Payer: Self-pay | Admitting: Family Medicine

## 2018-03-25 ENCOUNTER — Ambulatory Visit (INDEPENDENT_AMBULATORY_CARE_PROVIDER_SITE_OTHER): Payer: Medicare Other | Admitting: Family Medicine

## 2018-03-25 VITALS — BP 120/68 | HR 67 | Temp 97.8°F | Ht 62.0 in | Wt 160.2 lb

## 2018-03-25 DIAGNOSIS — Q909 Down syndrome, unspecified: Secondary | ICD-10-CM | POA: Diagnosis not present

## 2018-03-25 DIAGNOSIS — E785 Hyperlipidemia, unspecified: Secondary | ICD-10-CM

## 2018-03-25 DIAGNOSIS — G40909 Epilepsy, unspecified, not intractable, without status epilepticus: Secondary | ICD-10-CM | POA: Diagnosis not present

## 2018-03-25 DIAGNOSIS — E538 Deficiency of other specified B group vitamins: Secondary | ICD-10-CM | POA: Diagnosis not present

## 2018-03-25 LAB — CBC WITH DIFFERENTIAL/PLATELET
BASOS ABS: 0.1 10*3/uL (ref 0.0–0.1)
Basophils Relative: 2.1 % (ref 0.0–3.0)
Eosinophils Absolute: 0 10*3/uL (ref 0.0–0.7)
Eosinophils Relative: 0.9 % (ref 0.0–5.0)
HEMATOCRIT: 38.2 % — AB (ref 39.0–52.0)
Hemoglobin: 13 g/dL (ref 13.0–17.0)
Lymphocytes Relative: 25.1 % (ref 12.0–46.0)
Lymphs Abs: 0.9 10*3/uL (ref 0.7–4.0)
MCHC: 34 g/dL (ref 30.0–36.0)
MCV: 101.8 fl — AB (ref 78.0–100.0)
MONOS PCT: 12.7 % — AB (ref 3.0–12.0)
Monocytes Absolute: 0.5 10*3/uL (ref 0.1–1.0)
Neutro Abs: 2.1 10*3/uL (ref 1.4–7.7)
Neutrophils Relative %: 59.2 % (ref 43.0–77.0)
Platelets: 243 10*3/uL (ref 150.0–400.0)
RBC: 3.75 Mil/uL — AB (ref 4.22–5.81)
RDW: 14.7 % (ref 11.5–15.5)
WBC: 3.6 10*3/uL — ABNORMAL LOW (ref 4.0–10.5)

## 2018-03-25 LAB — LIPID PANEL
CHOL/HDL RATIO: 3
Cholesterol: 171 mg/dL (ref 0–200)
HDL: 57.8 mg/dL (ref >39.00)
LDL Cholesterol: 98 mg/dL (ref 0–99)
NonHDL: 113.6
Triglycerides: 77 mg/dL (ref 0.0–149.0)
VLDL: 15.4 mg/dL (ref 0.0–40.0)

## 2018-03-25 LAB — COMPREHENSIVE METABOLIC PANEL
ALK PHOS: 132 U/L — AB (ref 39–117)
ALT: 11 U/L (ref 0–53)
AST: 16 U/L (ref 0–37)
Albumin: 3.8 g/dL (ref 3.5–5.2)
BILIRUBIN TOTAL: 0.3 mg/dL (ref 0.2–1.2)
BUN: 22 mg/dL (ref 6–23)
CO2: 32 meq/L (ref 19–32)
Calcium: 9.2 mg/dL (ref 8.4–10.5)
Chloride: 100 mEq/L (ref 96–112)
Creatinine, Ser: 1.37 mg/dL (ref 0.40–1.50)
GFR: 59.25 mL/min — AB (ref >60.00)
Glucose, Bld: 94 mg/dL (ref 70–99)
Potassium: 4.3 mEq/L (ref 3.5–5.1)
Sodium: 137 mEq/L (ref 135–145)
Total Protein: 7.4 g/dL (ref 6.0–8.3)

## 2018-03-25 LAB — VITAMIN B12: Vitamin B-12: 576 pg/mL (ref 211–911)

## 2018-03-25 MED ORDER — QUETIAPINE FUMARATE 300 MG PO TABS: 600.0000 mg | ORAL_TABLET | Freq: Every day | ORAL | 11 refills | Status: DC

## 2018-03-25 MED ORDER — PHENOBARBITAL 64.8 MG PO TABS: ORAL_TABLET | ORAL | 5 refills | Status: DC

## 2018-03-25 MED ORDER — PRAVASTATIN SODIUM 10 MG PO TABS: 10.0000 mg | ORAL_TABLET | Freq: Every day | ORAL | 11 refills | Status: DC

## 2018-03-25 NOTE — Patient Instructions (Addendum)
Labs today  You are doing well today Continue current medicines Return as needed or in 1 year for wellness visit.

## 2018-03-25 NOTE — Assessment & Plan Note (Signed)
Stable period. [facility]care at [employer]. Form filled out.

## 2018-03-25 NOTE — Progress Notes (Signed)
BP 120/68 (BP Location: Left Arm, Patient Position: Sitting, Cuff Size: Normal)   Pulse 67   Temp 97.8 F (36.6 C) (Oral)   Ht 5\' 2"  (1.575 m)   Wt 160 lb 4 oz (72.7 kg)   SpO2 97%   BMI 29.31 kg/m    CC: med refill visit Subjective:    Patient ID: Kenneth [name], male    DOB: Jan 16, 1971, 47 y.o.   MRN: [id number]  HPI: Kenneth [name] is a 47 y.o. male presenting on 03/25/2018 for Medication Refill (Pt accompanied by his sister/legal guardian.) and Form Completion (Provided [employer] form to be completed. )   Last AMW was 10/2016.  Here with sister.  Saw Dr Karel Jarvis 11/2017, note reviewed. Seizure disorder - on phenobarb 4 tabs QHS, gabapentin 200mg  in am and 300mg  in pm.  Recent unresponsive episodes with syncope and possible jerking. Seen at ER. Saw cardiology s/p echo and 30d heart monitor.   Compliant with vit B12 daily.   Relevant past medical, surgical, family and social history reviewed and updated as indicated. Interim medical history since our last visit reviewed. Allergies and medications reviewed and updated. Outpatient Medications Prior to Visit  Medication Sig Dispense Refill  . gabapentin (NEURONTIN) 100 MG capsule Take 200 mg by mouth every morning and 300 mg by mouth every evening. 150 capsule 11  . vitamin B-12 (CYANOCOBALAMIN) 500 MCG tablet Take 500 mcg by mouth daily with breakfast.     . PHENobarbital (LUMINAL) 64.8 MG tablet TAKE 4 TABLETS BY MOUTH AT BEDTIME. 120 tablet 5  . pravastatin (PRAVACHOL) 10 MG tablet Take 1 tablet (10 mg total) by mouth daily. 90 tablet 0  . QUEtiapine (SEROQUEL) 300 MG tablet TAKE 2 TABLETS BY MOUTH EVERY NIGHT AT BEDTIME. 60 tablet 3  . doxycycline (VIBRAMYCIN) 100 MG capsule Take 1 capsule (100 mg total) by mouth 2 (two) times daily. One po bid x 7 days 14 capsule 0  . ondansetron (ZOFRAN ODT) 4 MG disintegrating tablet Take 1 tablet (4 mg total) by mouth every 8 (eight) hours as needed for nausea or vomiting. 15  tablet 0   No facility-administered medications prior to visit.      Per HPI unless specifically indicated in ROS section below Review of Systems     Objective:    BP 120/68 (BP Location: Left Arm, Patient Position: Sitting, Cuff Size: Normal)   Pulse 67   Temp 97.8 F (36.6 C) (Oral)   Ht 5\' 2"  (1.575 m)   Wt 160 lb 4 oz (72.7 kg)   SpO2 97%   BMI 29.31 kg/m   Wt Readings from Last 3 Encounters:  03/25/18 160 lb 4 oz (72.7 kg)  12/03/17 160 lb (72.6 kg)  07/04/17 159 lb 9.6 oz (72.4 kg)    Physical Exam  Constitutional: He appears well-developed and well-nourished. No distress.  HENT:  Mouth/Throat: Oropharynx is clear and moist. No oropharyngeal exudate.  Eyes: Pupils are equal, round, and reactive to light. Conjunctivae and EOM are normal.  Cardiovascular: Normal rate, regular rhythm and normal heart sounds.  No murmur heard. Pulmonary/Chest: Effort normal and breath sounds normal. No respiratory distress. He has no wheezes. He has no rales.  Musculoskeletal: He exhibits no edema.  Skin: No rash noted.  Psychiatric: He has a normal mood and affect.  Nursing note and vitals reviewed.  Results for orders placed or performed during the hospital encounter of 07/27/17  CBC WITH DIFFERENTIAL  Result Value Ref  Range   WBC 5.0 4.0 - 10.5 K/uL   RBC 3.93 (L) 4.22 - 5.81 MIL/uL   Hemoglobin 13.5 13.0 - 17.0 g/dL   HCT 78.2 95.6 - 21.3 %   MCV 102.5 (H) 78.0 - 100.0 fL   MCH 34.4 (H) 26.0 - 34.0 pg   MCHC 33.5 30.0 - 36.0 g/dL   RDW 08.6 57.8 - 46.9 %   Platelets 238 150 - 400 K/uL   Neutrophils Relative % 70 %   Neutro Abs 3.5 1.7 - 7.7 K/uL   Lymphocytes Relative 22 %   Lymphs Abs 1.1 0.7 - 4.0 K/uL   Monocytes Relative 7 %   Monocytes Absolute 0.4 0.1 - 1.0 K/uL   Eosinophils Relative 0 %   Eosinophils Absolute 0.0 0.0 - 0.7 K/uL   Basophils Relative 1 %   Basophils Absolute 0.1 0.0 - 0.1 K/uL  Comprehensive metabolic panel  Result Value Ref Range   Sodium 135  135 - 145 mmol/L   Potassium 4.9 3.5 - 5.1 mmol/L   Chloride 104 101 - 111 mmol/L   CO2 24 22 - 32 mmol/L   Glucose, Bld 100 (H) 65 - 99 mg/dL   BUN 14 6 - 20 mg/dL   Creatinine, Ser 6.29 0.61 - 1.24 mg/dL   Calcium 8.4 (L) 8.9 - 10.3 mg/dL   Total Protein 6.9 6.5 - 8.1 g/dL   Albumin 3.5 3.5 - 5.0 g/dL   AST 17 15 - 41 U/L   ALT 12 (L) 17 - 63 U/L   Alkaline Phosphatase 127 (H) 38 - 126 U/L   Total Bilirubin 0.3 0.3 - 1.2 mg/dL   GFR calc non Af Amer >60 >60 mL/min   GFR calc Af Amer >60 >60 mL/min   Anion gap 7 5 - 15  Urinalysis, Routine w reflex microscopic  Result Value Ref Range   Color, Urine STRAW (A) YELLOW   APPearance CLEAR CLEAR   Specific Gravity, Urine 1.014 1.005 - 1.030   pH 7.0 5.0 - 8.0   Glucose, UA NEGATIVE NEGATIVE mg/dL   Hgb urine dipstick NEGATIVE NEGATIVE   Bilirubin Urine NEGATIVE NEGATIVE   Ketones, ur NEGATIVE NEGATIVE mg/dL   Protein, ur NEGATIVE NEGATIVE mg/dL   Nitrite NEGATIVE NEGATIVE   Leukocytes, UA NEGATIVE NEGATIVE  Lipase, blood  Result Value Ref Range   Lipase 22 11 - 51 U/L  I-stat troponin, ED  Result Value Ref Range   Troponin i, poc 0.00 0.00 - 0.08 ng/mL   Comment 3              Assessment & Plan:   Problem List Items Addressed This Visit    Vitamin B12 deficiency    Continues daily tablet. Update labs today.       Relevant Orders   Vitamin B12   Seizure disorder (HCC) - Primary    Stable on phenobarb, gabapentin. Appreciate neuro care.      Relevant Medications   PHENobarbital (LUMINAL) 64.8 MG tablet   Other Relevant Orders   Comprehensive metabolic panel   CBC with Differential/Platelet   Phenobarbital level   HLD (hyperlipidemia)    Update labs on low dose pravastatin The 10-year ASCVD risk score Denman George DC Jr., et al., 2013) is: 1.5%   Values used to calculate the score:     Age: 1 years     Sex: Male     Is Non-Hispanic African American: No     Diabetic: No  Tobacco smoker: No     Systolic Blood  Pressure: 120 mmHg     Is BP treated: No     HDL Cholesterol: 57.4 mg/dL     Total Cholesterol: 186 mg/dL       Relevant Medications   pravastatin (PRAVACHOL) 10 MG tablet   Other Relevant Orders   Lipid panel   Comprehensive metabolic panel   Down syndrome    Stable period. [facility]care at [employer]. Form filled out.           Meds ordered this encounter  Medications  . PHENobarbital (LUMINAL) 64.8 MG tablet    Sig: TAKE 4 TABLETS BY MOUTH AT BEDTIME.    Dispense:  120 tablet    Refill:  5  . pravastatin (PRAVACHOL) 10 MG tablet    Sig: Take 1 tablet (10 mg total) by mouth daily.    Dispense:  30 tablet    Refill:  11  . QUEtiapine (SEROQUEL) 300 MG tablet    Sig: Take 2 tablets (600 mg total) by mouth at bedtime.    Dispense:  60 tablet    Refill:  11   Orders Placed This Encounter  Procedures  . Vitamin B12  . Lipid panel  . Comprehensive metabolic panel  . CBC with Differential/Platelet  . Phenobarbital level    Follow up plan: Return in about 1 year (around 03/26/2019) for medicare wellness visit.  Eustaquio BoydenJavier Jody Silas, MD

## 2018-03-25 NOTE — Assessment & Plan Note (Signed)
Stable on phenobarb, gabapentin. Appreciate neuro care.

## 2018-03-25 NOTE — Assessment & Plan Note (Signed)
Update labs on low dose pravastatin The 10-year ASCVD risk score Denman George(Goff DC Montez HagemanJr., et al., 2013) is: 1.5%   Values used to calculate the score:     Age: [age] years     Sex: Male     Is Non-Hispanic African American: No     Diabetic: No     Tobacco smoker: No     Systolic Blood Pressure: 120 mmHg     Is BP treated: No     HDL Cholesterol: 57.4 mg/dL     Total Cholesterol: 186 mg/dL

## 2018-03-25 NOTE — Assessment & Plan Note (Signed)
Continues daily tablet. Update labs today.

## 2018-03-27 LAB — PHENOBARBITAL LEVEL: Phenobarbital, Serum: 37.4 mg/L (ref 15.0–40.0)

## 2018-06-26 ENCOUNTER — Ambulatory Visit: Payer: Medicare Other | Admitting: Neurology

## 2018-08-08 ENCOUNTER — Encounter (HOSPITAL_COMMUNITY): Payer: Self-pay

## 2018-08-08 ENCOUNTER — Emergency Department (HOSPITAL_COMMUNITY)
Admission: EM | Admit: 2018-08-08 | Discharge: 2018-08-08 | Disposition: A | Discharge status: Home / Self Care | Payer: Medicare Other | Attending: Emergency Medicine | Admitting: Emergency Medicine

## 2018-08-08 ENCOUNTER — Other Ambulatory Visit: Payer: Self-pay

## 2018-08-08 ENCOUNTER — Emergency Department (HOSPITAL_COMMUNITY): Payer: Medicare Other

## 2018-08-08 DIAGNOSIS — M79621 Pain in right upper arm: Secondary | ICD-10-CM | POA: Diagnosis not present

## 2018-08-08 DIAGNOSIS — M25531 Pain in right wrist: Secondary | ICD-10-CM | POA: Insufficient documentation

## 2018-08-08 DIAGNOSIS — S4991XA Unspecified injury of right shoulder and upper arm, initial encounter: Secondary | ICD-10-CM | POA: Diagnosis not present

## 2018-08-08 DIAGNOSIS — S59911A Unspecified injury of right forearm, initial encounter: Secondary | ICD-10-CM | POA: Diagnosis not present

## 2018-08-08 DIAGNOSIS — Q909 Down syndrome, unspecified: Secondary | ICD-10-CM | POA: Insufficient documentation

## 2018-08-08 DIAGNOSIS — S6991XA Unspecified injury of right wrist, hand and finger(s), initial encounter: Secondary | ICD-10-CM | POA: Diagnosis not present

## 2018-08-08 DIAGNOSIS — Q219 Congenital malformation of cardiac septum, unspecified: Secondary | ICD-10-CM | POA: Diagnosis not present

## 2018-08-08 DIAGNOSIS — M79641 Pain in right hand: Secondary | ICD-10-CM | POA: Diagnosis not present

## 2018-08-08 DIAGNOSIS — Z79899 Other long term (current) drug therapy: Secondary | ICD-10-CM | POA: Diagnosis not present

## 2018-08-08 MED ORDER — IBUPROFEN 200 MG PO TABS
600.0000 mg | ORAL_TABLET | Freq: Once | ORAL | Status: AC
  Administered 2018-08-08: 600 mg via ORAL
  Filled 2018-08-08: qty 3

## 2018-08-08 NOTE — Discharge Instructions (Signed)
Continue Motrin and Tylenol for pain.  Rest, ice, compress and elevate the area.  Please call the office to schedule appointment.  Turn the ED patient tells any redness, worsening pain, fevers or for any other reason.

## 2018-08-08 NOTE — ED Notes (Signed)
Called Ortho for splint they are on the way.

## 2018-08-08 NOTE — ED Triage Notes (Signed)
Patient's sister reports that the patient told her he fell, but unsure of the injury to the right hand and right wrist.

## 2018-08-08 NOTE — ED Provider Notes (Addendum)
Kenneth [name] Provider Note   CSN: [id number] Arrival date & time: 08/08/18  1008     History   Chief Complaint Chief Complaint  Patient presents with  . Hand Pain  . Wrist Pain    HPI Kenneth [name] is a 48 y.o. male.  HPI 48 year old male past medical history significant for Down syndrome presents to the emergency department today for evaluation of right wrist pain.  Patient's sister is at bedside.  States that she was notified at work to that patient was complaining of right wrist pain.  When she came home patient states that he had fallen and hurt his right hand and wrist.  No history of trauma to the right hand.  Patient with history of Down syndrome is difficult to obtain accurate history from.  Mother states that he is sometimes overdramatic.  Patient seems to have pain with range of motion palpation of the dorsal aspect of all aspect of the right wrist. Past Medical History:  Diagnosis Date  . Cataracts, bilateral   . Congenital heart defect   . Down syndrome   . HLD (hyperlipidemia)   . Seizure disorder [facility](HCC)     Patient Active Problem List   Diagnosis Date Noted  . Localization-related symptomatic epilepsy and epileptic syndromes with complex partial seizures, not intractable, without status epilepticus (HCC) 12/22/2016  . Hearing trouble, bilateral 11/16/2016  . 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 DDD (degenerative disc disease), lumbar 08/08/2016  . Constipation 08/08/2016  . Acute pulmonary edema (HCC) 12/15/2015  . Advanced care planning/counseling discussion 07/13/2014  . Medicare annual wellness visit, subsequent 07/11/2013  . Down syndrome   . Seizure disorder (HCC)   . Vitamin B12 deficiency 04/22/2010  . HLD (hyperlipidemia) 02/07/2007  . 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 No pertinent surgical history.      Home Medications    Prior to Admission medications   Medication Sig Start Date End Date Taking?  Authorizing Provider  gabapentin (NEURONTIN) 100 MG capsule Take 200 mg by mouth every morning and 300 mg by mouth every evening. Patient taking differently: Take 200-300 mg by mouth See admin instructions. Take 200 mg by mouth every morning and 300 mg by mouth at bedtime.. 12/03/17  Yes Van ClinesAquino, Karen M, MD  PHENobarbital (LUMINAL) 64.8 MG tablet TAKE 4 TABLETS BY MOUTH AT BEDTIME. Patient taking differently: Take 259.2 mg by mouth at bedtime. TAKE 4 TABLETS BY MOUTH AT BEDTIME. 03/25/18  Yes Eustaquio BoydenGutierrez, Javier, MD  pravastatin (PRAVACHOL) 10 MG tablet Take 1 tablet (10 mg total) by mouth daily. Patient taking differently: Take 10 mg by mouth at bedtime.  03/25/18  Yes Eustaquio BoydenGutierrez, Javier, MD  QUEtiapine (SEROQUEL) 300 MG tablet Take 2 tablets (600 mg total) by mouth at bedtime. 03/25/18  Yes Eustaquio BoydenGutierrez, Javier, MD  vitamin B-12 (CYANOCOBALAMIN) 500 MCG tablet Take 500 mcg by mouth daily with breakfast.    Yes [provider]    Family History Family History  Problem Relation Age of Onset  . Diabetes Mother   . Hypertension Mother        Cerebral hemorrhage  . Diabetes Father   . Hypertension Father   . Coronary artery disease Father [id number]       MI  . Stroke Father   . Cancer Maternal Grandmother        Oral  . Cancer Maternal Aunt        Colon    Social History Social History   Tobacco  Use  . Smoking status: Never Smoker  . Smokeless tobacco: Never Used  Substance Use Topics  . Alcohol use: No  . Drug use: No     Allergies   Amoxicillin-pot clavulanate and Penicillins   Review of Systems Review of Systems  Unable to perform ROS: Other     Physical Exam Updated Vital Signs BP 115/80 (BP Location: Left Arm)   Pulse 79   Temp 99.6 F (37.6 C) (Oral)   Resp 18   Ht 5' 3.5" (1.613 m)   Wt 75.8 kg   SpO2 95%   BMI 29.12 kg/m   Physical Exam Vitals signs and nursing note reviewed.  Constitutional:      General: He is not in acute distress.    Appearance: He  is well-developed.  HENT:     Head: Normocephalic and atraumatic.  Eyes:     General: No scleral icterus.       Right eye: No discharge.        Left eye: No discharge.  Neck:     Musculoskeletal: Normal range of motion.  Pulmonary:     Effort: No respiratory distress.  Musculoskeletal: Normal range of motion.     Comments: Patient has pain to palpation of the dorsal aspect of the right hand along with approximately 1 cm cystlike, firm mobile structure to the dorsal aspect of the wrist.  This is tender to palpation.  Patient seems to have pain with range of motion of the right wrist.  There is no erythema. Mild warmth to the right hand and arm present.  No wound noted.  Patient's capillary refill is normal.  Radial pulses 2+ bilaterally.  Sensation intact.  There is no obvious abscess formation.  Cyst appears to be related to a ganglion cyst structure.  There is some mild edema to the dorsum of the right hand with some ecchymosis noted. Skin compartments are soft. The pain seems to be distractible.  Skin:    Coloration: Skin is not pale.  Neurological:     Mental Status: He is alert.  Psychiatric:        Behavior: Behavior normal.        Thought Content: Thought content normal.        Judgment: Judgment normal.      ED Treatments / Results  Labs (all labs ordered are listed, but only abnormal results are displayed) Labs Reviewed - No data to display  EKG None  Radiology Dg Forearm Right  Result Date: 08/08/2018 CLINICAL DATA:  Fall. EXAM: RIGHT FOREARM - 2 VIEW COMPARISON:  Right wrist series same day. FINDINGS: No acute bony or joint abnormality mild the forearm noted. Radius and ulna are intact. Degenerative change scratched it severe degenerative changes right elbow, reference is made to right hand and wrist reports. Mild degenerative changes right elbow. IMPRESSION: Radius and ulna intact.  No evidence of radial or ulnar injury. Electronically Signed   By: Maisie Fus  Register   On:  08/08/2018 12:39   Dg Wrist Complete Right  Result Date: 08/08/2018 CLINICAL DATA:  C/o right hand and wrist pain s/p fall today. Pt unable to verbalize any details. EXAM: RIGHT WRIST - COMPLETE 3+ VIEW COMPARISON:  None. FINDINGS: No acute fracture. There is subchondral cystic change in the distal radius, cystic change throughout the capitate and deformity, sub fragmentation and cystic change of the scaphoid. There is joint space narrowing of the radial scaphoid joint. Findings appear to be from old trauma. There is widening  of the scapholunate interval to 5 mm. There is soft tissue swelling. IMPRESSION: 1. No evidence of an acute fracture or dislocation. 2. Arthropathic changes of the wrist. Suspect these are degenerative changes subsequent to remote trauma. Widening of the scapholunate interval suggest disruption of the scapholunate ligament. Electronically Signed   By: Amie Portland M.D.   On: 08/08/2018 11:42   Dg Humerus Right  Result Date: 08/08/2018 CLINICAL DATA:  Right arm pain.  Fall. EXAM: RIGHT HUMERUS - 2+ VIEW COMPARISON:  No recent prior. FINDINGS: No acute bony or joint abnormalities identified. Right humerus is intact. Degenerative changes noted about the right shoulder and elbow. IMPRESSION: Right humerus is intact.  No evidence of fracture. Electronically Signed   By: Maisie Fus  Register   On: 08/08/2018 12:41   Dg Hand Complete Right  Result Date: 08/08/2018 CLINICAL DATA:  Pain following fall EXAM: RIGHT HAND - COMPLETE 3+ VIEW COMPARISON:  Right wrist radiographs August 08, 2018 FINDINGS: Frontal, oblique, and lateral views were obtained. There is evidence of old trauma involving the scaphoid bone with extensive remodeling. There is widening of the space between the scaphoid and lunate. There is arthropathy throughout the proximal carpal row laterally. There is remodeling in the capitate bone as well, without acute fracture in this area evident. No acute fracture or dislocation is  apparent.  No erosive changes. IMPRESSION: Trauma in the wrist region, most severely involving the scaphoid. Remodeling noted in these areas. Suspect a degree of scapholunate disassociation. No acute fracture or dislocation evident. No bony destruction or erosion. Electronically Signed   By: Bretta Bang III M.D.   On: 08/08/2018 11:46    Procedures Procedures (including critical care time)  Medications Ordered in ED Medications  ibuprofen (ADVIL,MOTRIN) tablet 600 mg (600 mg Oral Given 08/08/18 1228)     Initial Impression / Assessment and Plan / ED Course  I have reviewed the triage vital signs and the nursing notes.  Pertinent labs & imaging results that were available during my care of the patient were reviewed by me and considered in my medical decision making (see chart for details).     Patient presents to the ED for evaluation of right wrist pain after what appears to be a fall according to sister at bedside.  Patient is neurovascularly intact.  Does have some mild edema and ecchymosis of pain to the dorsum of the right hand however he does have a cystlike structure on the volar aspect of the right wrist that seems consistent with a ganglion cyst.  There is no significant fluctuance or erythema or warmth concerning for an abscess.  Low suspicion for septic arthritis.  No signs of tenosynovitis.  There is no open wounds or signs of cellulitis. No systemic signs of infection. X-rays do show a remote trauma to the area with some scaphoid lunate disassociation.  No acute fractures were noted.  Again patient has no fracture of the entire arm.  I spoke with Dale West Hurley with orthopedic hand surgery.  Dr. Merlyn Lot reviewed patient's x-rays and ordered him a volar splint with outpatient follow-up.  Patient remained neurovascularly intact.  Discussed NSAIDs and rice therapy at home with sister.  Patient needs hand surgery follow up.  Sister verbalized understanding of plan of care.  All  questions were answered prior to discharge.  Pt dicussed and seen by my attending Dr. Deretha Emory who is agreeable with the above plan.   Final Clinical Impressions(s) / ED Diagnoses   Final diagnoses:  Right  wrist pain    ED Discharge Orders    None         Wallace KellerLeaphart, Zanden Colver T, PA-C 08/08/18 1658    Vanetta MuldersZackowski, Scott, MD 08/20/18 [phone]

## 2018-08-08 NOTE — ED Provider Notes (Signed)
Was finishing patient's chart after patient was discharged and was looking at vitals and it looks like patient had a temperature of 100.2 at one-point but was not notified by nursing staff.  She was initially afebrile.  Patient never had any tachycardia.  Patient was given ibuprofen for pain which looks like would have resolved any fever that he [place] have developed.  Presentation  did not seem consistent with septic arthritis or any infectious process of the right wrist given that he did fall due to his trauma.   However given the low grade temperature I did tried to get in contact with patient's guardian Kenneth [name] I did leave a message on her cell phone stating that if patient develops any fevers this evening and has worsening pain to make sure that he is coming back to the ED for reevaluation immediatly.     Rise Mu, PA-C 08/08/18 1702    Vanetta Mulders, MD 08/20/18 [phone]

## 2018-08-28 DIAGNOSIS — M87039 Idiopathic aseptic necrosis of unspecified carpus: Secondary | ICD-10-CM | POA: Insufficient documentation

## 2018-08-28 DIAGNOSIS — M19031 Primary osteoarthritis, right wrist: Secondary | ICD-10-CM | POA: Diagnosis not present

## 2018-09-04 ENCOUNTER — Other Ambulatory Visit: Payer: Self-pay

## 2018-09-04 MED ORDER — PHENOBARBITAL 64.8 MG PO TABS: ORAL_TABLET | ORAL | 5 refills | Status: DC

## 2018-09-04 NOTE — Telephone Encounter (Signed)
plz phone in due to E prescribing error.  

## 2018-09-04 NOTE — Telephone Encounter (Signed)
Name of Medication: Luminal Name of Pharmacy: [facility] Last Fill or Written Date and Quantity: 08/31/18, #120 Last Office Visit and Type: 03/25/18, f/u Next Office Visit and Type: 03/31/19, CPE Last Controlled Substance Agreement Date: none Last UDS: none  Message on faxed refill request says, "Refill for March blister packs."

## 2018-09-05 NOTE — Telephone Encounter (Signed)
Spoke with [employer] [place] relaying refill info.

## 2018-09-19 ENCOUNTER — Telehealth: Payer: Self-pay

## 2018-09-19 DIAGNOSIS — Q909 Down syndrome, unspecified: Secondary | ICD-10-CM

## 2018-09-19 NOTE — Telephone Encounter (Signed)
Received faxed Certification of Medical Necessity form from [facility] to be completed.  Will need to be faxed back to her at [phone].   Also, Spoke with [place] to confirm pt's DOB.   Placed form in Dr. Timoteo Expose box.

## 2018-09-20 NOTE — Telephone Encounter (Signed)
Filled and in Lisa's box 

## 2018-09-20 NOTE — Telephone Encounter (Signed)
Faxed form.

## 2018-09-25 NOTE — Telephone Encounter (Signed)
Filled and placed in my out box. 

## 2018-09-25 NOTE — Telephone Encounter (Signed)
Best number [phone]  Taccaro @ [facility] faxed back medical necessity  form for tracking system   The last form needed correction for approval   please sign credentials and fax back to  [phone]

## 2018-09-25 NOTE — Telephone Encounter (Signed)
Placed form in Dr. G's box.  

## 2018-09-26 NOTE — Telephone Encounter (Signed)
Noted  

## 2018-12-02 ENCOUNTER — Telehealth: Payer: Self-pay | Admitting: Neurology

## 2018-12-02 ENCOUNTER — Telehealth: Payer: Self-pay | Admitting: *Deleted

## 2018-12-02 NOTE — Telephone Encounter (Signed)
Care Taker is calling in about patient having a spell. She thought It was a seizure so they call EMS and the EMS worker said that they thought it was something behavioral. The PCP asked that she call and make appt here. She has also called his Behavioral Dr and is awaiting a call from them. Didn't know if she needed an Evisit or appt at all. Didn't know if it could be something with medication. Please call her back. Thanks!

## 2018-12-02 NOTE — Telephone Encounter (Signed)
Noted, thanks!

## 2018-12-02 NOTE — Telephone Encounter (Signed)
Pls ask if he is back to his usual self. Has he missed any medications or been sleep-deprived or sick the past few days? I have an opening for e-visit on Wed 1:30pm or Thurs 11am to see him, thanks

## 2018-12-02 NOTE — Telephone Encounter (Signed)
Patient's sister Cala Bradford) called stating that when she got to the patient's house today he was lying in the floor jerking, so she called EMS. Cala Bradford stated that the paramedics think that this was more behavior than a seizure. The paramedic got on the phone and stated that the patient seems to be doing fine now and is sitting in a chair talking to one of the responders that he knows well.  The paramedic stated that he was told that the patient got upset before they were called. Cala Bradford stated that the patient seems to be acting out and has a history of doing that. Cala Bradford stated that she and the paramedics do not feel that he needs to go to the ER because he is doing fine now. Cala Bradford stated that he has gone to the hospital several times in the past when he ha aced like this and stayed for days and they run all kinds of test and find nothing new.   Called Cala Bradford back after speaking with Dr. Sharen Hones and advised her if patient is doing fine now that it is okay for him not to go to the ER. Chrystie Nose that patient is overdue seeing his neurologist and they need to call and get a follow-up appointment scheduled. Chrystie Nose if patient has another episode she needs to call 911 and he needs to be taken to the ER and she verbalized understanding. Chrystie Nose if he has another episode patient will need to see Dr. Sharen Hones until they can get his follow-up with neurology.

## 2018-12-02 NOTE — Telephone Encounter (Signed)
Noted. Agree. Recommend ER if ongoing jerking or seizure activity. Recommend OV if any recurrent episode. Recommend neuro f/u as overdue.

## 2018-12-02 NOTE — Telephone Encounter (Signed)
Pt is back to [place], he has not missed any medication is not sleep deprived or sick  Appointment scheduled for 1:30 ok per dr Karel Jarvis.

## 2018-12-04 ENCOUNTER — Telehealth (INDEPENDENT_AMBULATORY_CARE_PROVIDER_SITE_OTHER): Payer: Medicare Other | Admitting: Neurology

## 2018-12-04 ENCOUNTER — Other Ambulatory Visit: Payer: Self-pay

## 2018-12-04 DIAGNOSIS — G40209 Localization-related (focal) (partial) symptomatic epilepsy and epileptic syndromes with complex partial seizures, not intractable, without status epilepticus: Secondary | ICD-10-CM | POA: Diagnosis not present

## 2018-12-04 DIAGNOSIS — Q909 Down syndrome, unspecified: Secondary | ICD-10-CM

## 2018-12-04 MED ORDER — GABAPENTIN 100 MG PO CAPS: ORAL_CAPSULE | ORAL | 11 refills | Status: DC

## 2018-12-04 MED ORDER — PHENOBARBITAL 64.8 MG PO TABS: ORAL_TABLET | ORAL | 5 refills | Status: DC

## 2018-12-04 NOTE — Progress Notes (Signed)
Virtual Visit via Video Note The purpose of this virtual visit is to provide medical care while limiting exposure to the novel coronavirus.    Consent was obtained for video visit:  Yes.   Answered questions that patient had about telehealth interaction:  Yes.   I discussed the limitations, risks, security and privacy concerns of performing an evaluation and management service by telemedicine. I also discussed with the patient that there Line be a patient responsible charge related to this service. The patient expressed understanding and agreed to proceed.  Pt location: Home Physician Location: office Name of referring provider:  Eustaquio Boyden, MD I connected with [name] Hovatter at patients POA's initiation/request on 12/04/2018 at  1:30 PM EDT by video enabled telemedicine application and verified that I am speaking with the correct person using two identifiers. Pt MRN:  [id number] Pt DOB:  Mar 02, 1971 Video Participants:  [name] Deller;  Damien Fusi (sister)   History of Present Illness:  The patient was last seen in [place] 2019 for seizures. His sister is present during this e-visit to provide history as he is non-verbal. His sister had called our office on 12/02/2018 to report a shaking episode. She states that this was very different that all his prior episodes. She got a phone call from her daughter that he was laying on the floor shaking and would not stop. When she got home, he continued to shake, "like someone having a severe case of chills," with mouth open and making a snoring sound. He was still shaking when EMS arrived, and would stop and look at them when spoken to, then start shaking again. The entire episode lasted 20-30 minutes. EMS was told Josph got upset before they were called and that episode appeared behavioral. Cala Bradford reports that he got upset because he was not able to go to the shop because it was closed. She reports his behavior is good a lot of days with 1 or 2  bad days mixed in. Prior to this, his last seizure was in December 2018. He is on Phenobarbital 64.8mg  4 tabs qhs and Gabapentin 200mg  in AM, 300mg  in PM with no missed medications. Cala Bradford states he has no complaints. He points to his right wrist, she states he has arthritis. No falls. Sleep is good.   HPI 12/22/2016: This is a 48 yo man with a history of Down syndrome, hyperlipidemia, and seizures since age 19. History is obtained from his sister due to the patient's cognitive status. He started having seizures at age 89 where he would be staring, jerking, "like a fish out of water," lasting 1-2 minutes, with associated tongue bite. Phenobarbital dose was increased each time, until dose was increased to 64.8mg  4 tabs qhs and seizures stopped at around age 67. He had been seizure-free until 12/12/15 as they were sitting in the car, he became unresponsive and fell over. He was cold and not breathing. His sister denies any body jerks, stating this episode was not like his prior seizures. She was instructed to do chest compressions. He came to after 3-4 minutes. He was brought to [facility] ER where he was noted to be retching and vomiting tan-colored fluid. Bloodwork was unremarkable, phenobarbital level 40.4. CXR showed diffuse bilateral airspace disease probable pulmonary edema. 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 He was discharged home on doxycycline. He was event-free for almost a  year until 11/21/16 on the [employer] bus from his day program when he slumped over and "jiggled a little" for 1 minute. They sat him up and he completely went back out and was shaking "like a fish out of water" for 12-15 minutes. There was report of eye blinking on ER notes. He was back to baseline in the ER and when his sister arrived. He lives with his sister and her family, she administers his medications and denies any  missed doses. Sleep is good, he takes Seroquel every night. He has had behavioral issues since childhood which are unchanged, no recent changes in the home except for his 7284-month old nephew. He has been taking gabapentin 200mg  BID for at least 2 years to "keep him on track" with the anxiety. His sister denies any increased anxiety recently. She denies any other episodes of unresponsiveness. Due to his cognitive status, unable to obtain much information about his symptoms. He denies any pain, difficulties with his extremities. He does state he cannot see out of the left eye. He has bilateral cataracts per sister. He goes to a day program daily.   Update 12/03/2017: He has had several episodes of unresponsiveness. He had one in October 2018 where he was slumped over in the bus. EMS reported 2 seizures lasting 20 seconds each (eyes fluttering for each), given IV Versed. Bloodwork showed slightly elevated creatinine 1.31. Note of MCV 102.1, Hct 39.8. His B12 in April was 786. He had a head CT with no acute changes seen. His EKG reported sinus rhythm, ventricular premature complex aberrant conduction SV complexes, abnormal R-wave progression, early transition, borderline ST elevation anterior leads. Gabapentin dose was increased to 200mg  in AM, 300mg  in PM. He is also taking Phenobarbital 64.8mg  4 tabs qhs. Since his last visit, he has kindly been evaluated by Cardiology, he has had an echocardiogram and a holter monitor. He was unable to tolerate the holter monitor, with <50% of 30-day recording showing no abnormal rhythm. He was back in the ER on 07/27/17 for another episode of had another episode of loss of consciousness. He was complaining of chest pain, on EMS arrival he denied this then had a possible syncopal episode for approximately 10 seconds where he was laid supine and immediately came back to baseline. Head CT unremarkable. He was also reporting abdominal pain, nausea, and left shoulder pain. His chest xray  showed a left lower lobe infiltrate and he was discharged home on antibiotic.  Epilepsy Risk Factors:  Down syndrome. Otherwise he had a normal birth, no history of febrile convulsions, CNS infections such as meningitis/encephalitis, significant traumatic brain injury, neurosurgical procedures, or family history of seizures.  Diagnostic Data: His 1-hour awake and asleep EEG did not show any epileptiform changes, there was mild diffuse background slowing and excess diffuse beta activity seen. He was unable to do the MRI. CT head 06/2017 no acute changes.   Observations/Objective:   Patient is awake, alert, non-verbal. He waves when spoken to, and follows 1-step commands. Cranial nerves: Extraocular movements intact with no nystagmus. No facial asymmetry. Motor: moves all extremities symmetrically, at least anti-gravity x 4. No incoordination on finger to nose testing. Gait: hunched posture, slow and cautious, no ataxia  Assessment and Plan:   This is a 48 yo man with a history of Down syndrome, hyperlipidemia, and seizures since age 89, he had been seizure-free since age 48 on Phenobarbital 64.8mg  4 tabs qhs and Gabapentin 200mg  in AM, 300mg  in PM until he had  an episode of unresponsiveness in [place] 2017 with no convulsive activity, then in April 2018 had 2 episodes in one day with described unresponsiveness and jerking. He has had 3 brief syncopal episodes, last episode in 06/2017. His EEG showed mild diffuse slowing, head CT no acute changes. He had an unusual spell on 5/4 where he was described to have intermittent shaking that stopped when spoken to by EMS, then restarted, different from his prior events, and more suggestive of a behavioral event (was reported upset prior). His sister has contacted his psychiatrist. We agreed to continue on current AEDs with no changes at this time. His sister knows to call for any changes, he will follow-up in 6-8 months.  Follow Up Instructions:   -I discussed the  assessment and treatment plan with the patient's sister/POA. She was provided an opportunity to ask questions and all were answered. She agreed with the plan and demonstrated an understanding of the instructions.   The patient's POA was advised to call back or seek an in-person evaluation if the symptoms worsen or if the condition fails to improve as anticipated.    Van Clines, MD

## 2018-12-13 DIAGNOSIS — F330 Major depressive disorder, recurrent, mild: Secondary | ICD-10-CM | POA: Diagnosis not present

## 2019-03-13 ENCOUNTER — Other Ambulatory Visit: Payer: Self-pay

## 2019-03-13 MED ORDER — QUETIAPINE FUMARATE 300 MG PO TABS: 600.0000 mg | ORAL_TABLET | Freq: Every day | ORAL | 11 refills | Status: DC

## 2019-03-13 NOTE — Telephone Encounter (Signed)
Note from pharmacy:  Need refills for Sept bubble packs  Seroquel Last rx:  8/6/9, #60 Last OV:  03/25/18, f/u Next OV:  03/31/19, AWV

## 2019-03-18 ENCOUNTER — Other Ambulatory Visit: Payer: Self-pay

## 2019-03-18 MED ORDER — PRAVASTATIN SODIUM 10 MG PO TABS: 10.0000 mg | ORAL_TABLET | Freq: Every day | ORAL | 2 refills | Status: DC

## 2019-03-18 NOTE — Telephone Encounter (Signed)
E-scribed refill 

## 2019-03-31 ENCOUNTER — Encounter: Payer: Medicare Other | Admitting: Family Medicine

## 2019-04-10 DIAGNOSIS — F330 Major depressive disorder, recurrent, mild: Secondary | ICD-10-CM | POA: Diagnosis not present

## 2019-05-22 ENCOUNTER — Ambulatory Visit: Payer: Medicare Other | Admitting: Family Medicine

## 2019-06-09 ENCOUNTER — Other Ambulatory Visit: Payer: Self-pay

## 2019-06-09 ENCOUNTER — Telehealth: Payer: Self-pay

## 2019-06-09 MED ORDER — PHENOBARBITAL 64.8 MG PO TABS: ORAL_TABLET | ORAL | 5 refills | Status: DC

## 2019-06-09 MED ORDER — GABAPENTIN 100 MG PO CAPS: ORAL_CAPSULE | ORAL | 11 refills | Status: DC

## 2019-06-09 NOTE — Telephone Encounter (Signed)
Refills sent, thanks!

## 2019-06-09 NOTE — Telephone Encounter (Signed)
Will you refill Phenobarbital please? Pt has an appt in December.

## 2019-07-04 ENCOUNTER — Other Ambulatory Visit: Payer: Self-pay | Admitting: Family Medicine

## 2019-07-09 ENCOUNTER — Other Ambulatory Visit: Payer: Self-pay

## 2019-07-09 ENCOUNTER — Encounter: Payer: Self-pay | Admitting: Family Medicine

## 2019-07-09 ENCOUNTER — Telehealth (INDEPENDENT_AMBULATORY_CARE_PROVIDER_SITE_OTHER): Payer: Medicare Other | Admitting: Neurology

## 2019-07-09 ENCOUNTER — Ambulatory Visit (INDEPENDENT_AMBULATORY_CARE_PROVIDER_SITE_OTHER): Payer: Medicare Other | Admitting: Family Medicine

## 2019-07-09 ENCOUNTER — Ambulatory Visit (INDEPENDENT_AMBULATORY_CARE_PROVIDER_SITE_OTHER)
Admission: RE | Admit: 2019-07-09 | Discharge: 2019-07-09 | Disposition: A | Discharge status: Home / Self Care | Payer: Medicare Other | Source: Ambulatory Visit | Attending: Family Medicine | Admitting: Family Medicine

## 2019-07-09 VITALS — BP 118/78 | HR 71 | Temp 97.8°F | Ht 62.0 in | Wt 157.1 lb

## 2019-07-09 VITALS — Ht 62.0 in | Wt 157.0 lb

## 2019-07-09 DIAGNOSIS — M79672 Pain in left foot: Secondary | ICD-10-CM | POA: Insufficient documentation

## 2019-07-09 DIAGNOSIS — Q909 Down syndrome, unspecified: Secondary | ICD-10-CM | POA: Diagnosis not present

## 2019-07-09 DIAGNOSIS — G40209 Localization-related (focal) (partial) symptomatic epilepsy and epileptic syndromes with complex partial seizures, not intractable, without status epilepticus: Secondary | ICD-10-CM | POA: Diagnosis not present

## 2019-07-09 DIAGNOSIS — M2142 Flat foot [pes planus] (acquired), left foot: Secondary | ICD-10-CM

## 2019-07-09 DIAGNOSIS — B353 Tinea pedis: Secondary | ICD-10-CM | POA: Diagnosis not present

## 2019-07-09 DIAGNOSIS — M2141 Flat foot [pes planus] (acquired), right foot: Secondary | ICD-10-CM

## 2019-07-09 DIAGNOSIS — M2140 Flat foot [pes planus] (acquired), unspecified foot: Secondary | ICD-10-CM | POA: Insufficient documentation

## 2019-07-09 MED ORDER — CLOTRIMAZOLE 1 % EX CREA: 1.0000 "application " | TOPICAL_CREAM | Freq: Two times a day (BID) | CUTANEOUS | Status: DC

## 2019-07-09 MED ORDER — FUNGI-NAIL 25 % EX SOLN: CUTANEOUS | 0 refills | Status: DC

## 2019-07-09 MED ORDER — IBUPROFEN 200 MG PO CAPS: ORAL_CAPSULE | ORAL | Status: DC

## 2019-07-09 MED ORDER — DICLOFENAC SODIUM 1 % EX GEL: 2.0000 g | Freq: Three times a day (TID) | CUTANEOUS | 1 refills | Status: DC

## 2019-07-09 NOTE — Progress Notes (Signed)
Virtual Visit via Video Note The purpose of this virtual visit is to provide medical care while limiting exposure to the novel coronavirus.    Consent was obtained for video visit:  Yes.   Answered questions that patient had about telehealth interaction:  Yes.   I discussed the limitations, risks, security and privacy concerns of performing an evaluation and management service by telemedicine. I also discussed with the patient that there Blick be a patient responsible charge related to this service. The patient expressed understanding and agreed to proceed.  Pt location: Home Physician Location: office Name of referring provider:  Eustaquio Boyden, MD I connected with Kenneth [name] at patients initiation/request on 07/09/2019 at  3:00 PM EST by video enabled telemedicine application and verified that I am speaking with the correct person using two identifiers. Pt MRN:  [id number] Pt DOB:  02/04/1971 Video Participants:  Kenneth [name];  Kenneth [name] (sister)   History of Present Illness:  The patient was seen as a virtual video visit on 07/09/2019. He was last seen 7 months ago in the neurology clinic for seizures. His sister Kenneth [name] is present to provide history due to his cognitive impairment. Since his last visit, she denies any further shaking episodes since [place] 2020. This event was very different from prior ones, and felt to be more behavioral. Kenneth [name] reports he is not having as much behavioral changes since he stopped going to [employer]. His last known seizure was in December 2018, he is on Phenobarbital 64.8mg  4 tabs qhs and Gabapentin 200mg  in AM, 300mg  in PM with no side effects. He states he is not sleeping well, his sister reports his sleep is okay. states he has had no complaints except for his left foot, which was evaluated by his PCP earlier today.  History on Initial Assessment 12/22/2016: This is a 48 yo man with a history of Down syndrome, hyperlipidemia, and  seizures since age 35. History is obtained from his sister due to the patient's cognitive status. He started having seizures at age 48 where he would be staring, jerking, "like a fish out of water," lasting 1-2 minutes, with associated tongue bite. Phenobarbital dose was increased each time, until dose was increased to 64.8mg  4 tabs qhs and seizures stopped at around age 48. He had been seizure-free until 12/12/15 as they were sitting in the car, he became unresponsive and fell over. He was cold and not breathing. His sister denies any body jerks, stating this episode was not like his prior seizures. She was instructed to do chest compressions. He came to after 3-4 minutes. He was brought to [facility] ER where he was noted to be retching and vomiting tan-colored fluid. Bloodwork was unremarkable, phenobarbital level 40.4. CXR showed diffuse bilateral airspace disease probable pulmonary edema. 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 He was discharged home on doxycycline. He was event-free for almost a year until 11/21/16 on [place] [employer] bus from his day program when he slumped over and "jiggled a little" for 1 minute. They sat him up and he completely went back out and was shaking "like a fish out of water" for 12-15 minutes. There was report of eye blinking on ER notes. He was back to baseline in the ER and when his sister arrived. He lives with his sister and her family, she administers his medications and denies any  missed doses. Sleep is good, he takes Seroquel every night. He has had behavioral issues since childhood which are unchanged, no recent changes in the home except for his 30-month old nephew. He has been taking gabapentin 200mg  BID for at least 2 years to "keep him on track" with the anxiety. His sister denies any increased anxiety recently. She denies any other episodes of unresponsiveness. Due  to his cognitive status, unable to obtain much information about his symptoms. He denies any pain, difficulties with his extremities. He does state he cannot see out of the left eye. He has bilateral cataracts per sister. He goes to a day program daily.   Update 12/03/2017: He has had several episodes of unresponsiveness. He had one in October 2018 where he was slumped over in the bus. EMS reported 2 seizures lasting 20 seconds each (eyes fluttering for each), given IV Versed. Bloodwork showed slightly elevated creatinine 1.31. Note of MCV 102.1, Hct 39.8. His B12 in April was 786. He had a head CT with no acute changes seen. His EKG reported sinus rhythm, ventricular premature complex aberrant conduction SV complexes, abnormal R-wave progression, early transition, borderline ST elevation anterior leads. Gabapentin dose was increased to 200mg  in AM, 300mg  in PM. He is also taking Phenobarbital 64.8mg  4 tabs qhs. Since his last visit, he has kindly been evaluated by Cardiology, he has had an echocardiogram and a holter monitor. He was unable to tolerate the holter monitor, with <50% of 30-day recording showing no abnormal rhythm. He was back in the ER on 07/27/17 for another episode of had another episode of loss of consciousness. He was complaining of chest pain, on EMS arrival he denied this then had a possible syncopal episode for approximately 10 seconds where he was laid supine and immediately came back to baseline. Head CT unremarkable. He was also reporting abdominal pain, nausea, and left shoulder pain. His chest xray showed a left lower lobe infiltrate and he was discharged home on antibiotic.  Epilepsy Risk Factors:  Down syndrome. Otherwise he had a normal birth, no history of febrile convulsions, CNS infections such as meningitis/encephalitis, significant traumatic brain injury, neurosurgical procedures, or family history of seizures.  Diagnostic Data: His 1-hour awake and asleep EEG did not show any  epileptiform changes, there was mild diffuse background slowing and excess diffuse beta activity seen. He was unable to do the MRI. CT head 06/2017 no acute changes.      Current Outpatient Medications on File Prior to Visit  Medication Sig Dispense Refill  . Ascorbic Acid (VITAMIN C) 1000 MG tablet Take 1,000 mg by mouth daily.    Marland Kitchen gabapentin (NEURONTIN) 100 MG capsule Take 200 mg by mouth every morning and 300 mg by mouth every evening. 150 capsule 11  . Multiple Vitamin (MULTIVITAMIN) tablet Take 1 tablet by mouth daily.    Marland Kitchen PHENobarbital (LUMINAL) 64.8 MG tablet TAKE 4 TABLETS BY MOUTH AT BEDTIME. 120 tablet 5  . pravastatin (PRAVACHOL) 10 MG tablet TAKE ONE TABLET BY MOUTH DAILY (PINK ROUND TABLET WITH APO PRA 10) 30 tablet 0  . QUEtiapine (SEROQUEL) 300 MG tablet Take 2 tablets (600 mg total) by mouth at bedtime. 60 tablet 11  . vitamin B-12 (CYANOCOBALAMIN) 500 MCG tablet Take 500 mcg by mouth daily with breakfast.      No current facility-administered medications on file prior to visit.      Observations/Objective:   Vitals:   07/09/19 1421  Weight: 157 lb (71.2 kg)  Height:  5\' 2"  (1.575 m)   GEN:  The patient appears stated age and is in NAD.  Neurological examination: Patient is awake, alert, oriented x 1. Mild dysarthria (chronic). Reduced fluency and comprehension, he says "I love you" repeatedly, tries to follow simple commands.  Cranial nerves: Extraocular movements intact with no nystagmus. No facial asymmetry. Motor: moves all extremities symmetrically, at least anti-gravity x 4.   Assessment and Plan:   This is a 48 yo man with a history of Down syndrome, hyperlipidemia, and seizures since age 89, he had been seizure-free since age 48 on Phenobarbital 64.8mg  4 tabs qhs and Gabapentin 200mg  in AM, 300mg  in PM until he had an episode of unresponsiveness in [place] 2017 with no convulsive activity, then in April 2018 had 2 episodes in one day with described unresponsiveness  and jerking. He has had 3 brief syncopal episodes, last episode in 06/2017. His EEG showed mild diffuse slowing, head CT no acute changes. Overall symptoms have been stable on current medications, no side effects, refills sent. His sister knows to call for any changes, he will follow-up in 1 year.   Follow Up Instructions:   -I discussed the assessment and treatment plan with the patient's sister. The patient's sister was provided an opportunity to ask questions and all were answered. The patient's sister agreed with the plan and demonstrated an understanding of the instructions.   The patient's sister was advised to call back or seek an in-person evaluation if the symptoms worsen or if the condition fails to improve as anticipated.    Van ClinesKaren M , MD

## 2019-07-09 NOTE — Assessment & Plan Note (Signed)
Sister endorses medial foot collapse is worsening - refer to podiatry to discuss management options.

## 2019-07-09 NOTE — Assessment & Plan Note (Addendum)
Presumed with onychomycosis - discussed lotrimin and funginail use.

## 2019-07-09 NOTE — Progress Notes (Signed)
This visit was conducted in person.  BP 118/78 (BP Location: Left Arm, Patient Position: Sitting, Cuff Size: Normal)   Pulse 71   Temp 97.8 F (36.6 C) (Temporal)   Ht 5\' 2"  (1.575 m)   Wt 157 lb 1 oz (71.2 kg)   SpO2 98%   BMI 28.73 kg/m    CC: L foot pain Subjective:    Patient ID: Kenneth [name], male    DOB: September 13, 1970, 48 y.o.   MRN: [id number]  HPI: Kenneth [name] is a 48 y.o. male presenting on 07/09/2019 for Foot Pain (C/o left foot pain. Beckum be due to injury.  Started 07/07/19.  Pt accompanied by sister, 14/7/20, 97.9].)   2-3d h/o L foot pain - sister noted him starting to limp. He states he fell - unsure of details. Using ibuprofen 800mg  sparingly as well as ibuprofen/tylenol OTC (400mg  ibuprofen yesterday). No redness   Has stayed out of [employer] since pandemic started.  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     Relevant past medical, surgical, family and social history reviewed and updated as indicated. Interim medical history since our last visit reviewed. Allergies and medications reviewed and updated. Outpatient Medications Prior to Visit  Medication Sig Dispense Refill  . Ascorbic Acid (VITAMIN C) 1000 MG tablet Take 1,000 mg by mouth daily.    gabapentin (NEURONTIN) 100 MG capsule Take 200 mg by mouth every morning and 300 mg by mouth every evening. 150 capsule 11  . Multiple Vitamin (MULTIVITAMIN) tablet Take 1 tablet by mouth daily.    PHENobarbital (LUMINAL) 64.8 MG tablet TAKE 4 TABLETS BY MOUTH AT BEDTIME. 120 tablet 5  . pravastatin (PRAVACHOL) 10 MG tablet TAKE ONE TABLET BY MOUTH DAILY (PINK ROUND TABLET WITH APO PRA 10) 30 tablet 0  . QUEtiapine (SEROQUEL) 300 MG tablet Take 2 tablets (600 mg total) by mouth at bedtime. 60 tablet 11  . vitamin B-12 (CYANOCOBALAMIN) 500 MCG tablet Take 500 mcg by mouth daily with breakfast.     . IBU 800 MG tablet      No  facility-administered medications prior to visit.      Per HPI unless specifically indicated in ROS section below Review of Systems Objective:    BP 118/78 (BP Location: Left Arm, Patient Position: Sitting, Cuff Size: Normal)   Pulse 71   Temp 97.8 F (36.6 C) (Temporal)   Ht 5\' 2"  (1.575 m)   Wt 157 lb 1 oz (71.2 kg)   SpO2 98%   BMI 28.73 kg/m   Wt Readings from Last 3 Encounters:  07/09/19 157 lb 1 oz (71.2 kg)  12/03/18 165 lb (74.8 kg)  08/08/18 167 lb (75.8 kg)    Physical Exam Vitals signs and nursing note reviewed. Exam conducted with a chaperone present.  Constitutional:      Appearance: Normal appearance. He is not ill-appearing.  Musculoskeletal: Normal range of motion.        General: Swelling and tenderness present.     Comments:  Thickened onychomycotic nails with interdigital maceration and scaling of soles bilaterally Marked bilateral flat foot with loss of longitudinal arches R foot without pain/swelling L foot - generalized tenderness throughout but seems maximal at medial ankle into midfoot. No pain at base of 5th MT or at lat/med malleolus. No pain at 1st MTPJ.   Skin:    General: Skin is warm  and dry.     Findings: No erythema.  Neurological:     Mental Status: He is alert.        Assessment & Plan:  This visit occurred during the SARS-CoV-2 public health emergency.  Safety protocols were in place, including screening questions prior to the visit, additional usage of staff PPE, and extensive cleaning of exam room while observing appropriate contact time as indicated for disinfecting solutions.   Problem List Items Addressed This Visit    Tinea pedis    Presumed with onychomycosis - discussed lotrimin and funginail use.       Relevant Medications   Undecylenic Acid (FUNGI-NAIL) 25 % SOLN   clotrimazole (LOTRIMIN AF) 1 % cream   Other Relevant Orders   Ambulatory referral to Podiatry   Pes planus    Sister endorses medial foot collapse is  worsening - refer to podiatry to discuss management options.       Relevant Orders   Ambulatory referral to Podiatry   Left foot pain - Primary    Difficult to pinpoint area of maximal tenderness as he endorses entire L foot hurts. Check films - no obvious fracture appreciated. Anticipate medial foot tendonitis (PT, FHL, FDL). Will place in post op shoe to limit foot mobility, rec limited oral NSAID and topical voltaren. Significant pes planus contributing - will refer to podiatry for further eval/management. Sister/caregiver agrees with plan.       Relevant Orders   DG Foot Complete Left   Ambulatory referral to Podiatry   Down syndrome    Complicates history taking.           Meds ordered this encounter  Medications  . Ibuprofen 200 MG CAPS    Sig: Take 400-600mg  twice daily with meals as needed for foot pain  . Undecylenic Acid (FUNGI-NAIL) 25 % SOLN    Sig: Apply nail laquer to toenails daily    Dispense:  480 mL    Refill:  0  . clotrimazole (LOTRIMIN AF) 1 % cream    Sig: Apply 1 application topically 2 (two) times daily.  . diclofenac Sodium (VOLTAREN) 1 % GEL    Sig: Apply 2 g topically 3 (three) times daily.    Dispense:  100 g    Refill:  1   Orders Placed This Encounter  Procedures  . DG Foot Complete Left    Standing Status:   Future    Number of Occurrences:   1    Standing Expiration Date:   09/08/2020    Order Specific Question:   Reason for Exam (SYMPTOM  OR DIAGNOSIS REQUIRED)    Answer:   medial L foot pain, unknown injury    Order Specific Question:   Preferred [facility]?    [facility]    Order Specific Question:   Radiology Contrast Protocol - do NOT remove file path    Answer:   [email or web address]  . Ambulatory referral to Podiatry    Referral Priority:   Routine    Referral Type:   Consultation    Referral Reason:   Specialty Services Required    Requested Specialty:   Podiatry     Number of Visits Requested:   1    Patient Instructions  xrays of left foot today - I don't see fracture.  Possible tendonitis of inner foot. Treat with voltaren gel as well as ibuprofen 400-600mg  2-3 times daily with meals for next 5 days.  For significant flat footedness -  we will refer you to podiatrist (foot doctor) For thickened nails - likely with fungal infection in nails and around skin. Treat with patting dry after shower, apply OTC lotrimin cream between toes, and apply daily Funginail antifungal nail laquer    Follow up plan: No follow-ups on file.  Ria Bush, MD

## 2019-07-09 NOTE — Assessment & Plan Note (Signed)
Difficult to pinpoint area of maximal tenderness as he endorses entire L foot hurts. Check films - no obvious fracture appreciated. Anticipate medial foot tendonitis (PT, FHL, FDL). Will place in post op shoe to limit foot mobility, rec limited oral NSAID and topical voltaren. Significant pes planus contributing - will refer to podiatry for further eval/management. Sister/caregiver agrees with plan.

## 2019-07-09 NOTE — Patient Instructions (Addendum)
xrays of left foot today - I don't see fracture.  Possible tendonitis of inner foot. Treat with voltaren gel as well as ibuprofen 400-600mg  2-3 times daily with meals for next 5 days.  For significant flat footedness - we will refer you to podiatrist (foot doctor) For thickened nails - likely with fungal infection in nails and around skin. Treat with patting dry after shower, apply OTC lotrimin cream between toes, and apply daily Funginail antifungal nail laquer

## 2019-07-09 NOTE — Assessment & Plan Note (Signed)
Complicates history taking.

## 2019-07-21 ENCOUNTER — Ambulatory Visit (INDEPENDENT_AMBULATORY_CARE_PROVIDER_SITE_OTHER): Payer: Medicare Other

## 2019-07-21 VITALS — Ht 63.0 in | Wt 160.0 lb

## 2019-07-21 DIAGNOSIS — Z0000 Encounter for general adult medical examination without abnormal findings: Secondary | ICD-10-CM

## 2019-07-21 NOTE — Patient Instructions (Signed)
Mr. Kenneth [name] , Thank you for taking time to come for your Medicare Wellness Visit. I appreciate your ongoing commitment to your health goals. Please review the following plan we discussed and let me know if I can assist you in the future.   Screening recommendations/referrals: Colonoscopy: @ age 48 Recommended yearly ophthalmology/optometry visit for glaucoma screening and checkup Recommended yearly dental visit for hygiene and checkup  Vaccinations: Influenza vaccine: will get at physical Pneumococcal vaccine: @ age 57 Tdap vaccine: Up to date, completed 11/06/2013 Shingles vaccine: @ age 35    Advanced directives: Advance directive discussed with you today. I have provided a copy for you to complete at home and have notarized. Once this is complete please bring a copy in to our office so we can scan it into your chart.  Conditions/risks identified: hyperlipidemia  Next appointment: 07/28/2019 @ 4 pm   Preventive Care 40-64 Years, Male Preventive care refers to lifestyle choices and visits with your health care provider that can promote health and wellness. What does preventive care include?  A yearly physical exam. This is also called an annual well check.  Dental exams once or twice a year.  Routine eye exams. Ask your health care provider how often you should have your eyes checked.  Personal lifestyle choices, including:  Daily care of your teeth and gums.  Regular physical activity.  Eating a healthy diet.  Avoiding tobacco and drug use.  Limiting alcohol use.  Practicing safe sex.  Taking low-dose aspirin every day starting at age 31. What happens during an annual well check? The services and screenings done by your health care provider during your annual well check will depend on your age, overall health, lifestyle risk factors, and family history of disease. Counseling  Your health care provider [place] ask you questions about your:  Alcohol use.  Tobacco  use.  Drug use.  Emotional well-being.  Home and relationship well-being.  Sexual activity.  Eating habits.  Work and work Astronomer. Screening  You Cecere have the following tests or measurements:  Height, weight, and BMI.  Blood pressure.  Lipid and cholesterol levels. These Dirr be checked every 5 years, or more frequently if you are over 45 years old.  Skin check.  Lung cancer screening. You Mccowen have this screening every year starting at age 22 if you have a 30-pack-year history of smoking and currently smoke or have quit within the past 15 years.  Fecal occult blood test (FOBT) of the stool. You Wipperfurth have this test every year starting at age 55.  Flexible sigmoidoscopy or colonoscopy. You Salguero have a sigmoidoscopy every 5 years or a colonoscopy every 10 years starting at age 38.  Prostate cancer screening. Recommendations will vary depending on your family history and other risks.  Hepatitis C blood test.  Hepatitis B blood test.  Sexually transmitted disease (STD) testing.  Diabetes screening. This is done by checking your blood sugar (glucose) after you have not eaten for a while (fasting). You Fichera have this done every 1-3 years. Discuss your test results, treatment options, and if necessary, the need for more tests with your health care provider. Vaccines  Your health care provider Monte recommend certain vaccines, such as:  Influenza vaccine. This is recommended every year.  Tetanus, diphtheria, and acellular pertussis (Tdap, Td) vaccine. You Eckroth need a Td booster every 10 years.  Zoster vaccine. You Schimming need this after age 110.  Pneumococcal 13-valent conjugate (PCV13) vaccine. You Fennel need this if  you have certain conditions and have not been vaccinated.  Pneumococcal polysaccharide (PPSV23) vaccine. You Gracia need one or two doses if you smoke cigarettes or if you have certain conditions. Talk to your health care provider about which screenings and vaccines you  need and how often you need them. This information is not intended to replace advice given to you by your health care provider. Make sure you discuss any questions you have with your health care provider. Document Released: 08/13/2015 Document Revised: 04/05/2016 Document Reviewed: 05/18/2015 Elsevier Interactive Patient Education  2017 [employer] Prevention in the Home Falls can cause injuries. They can happen to people of all ages. There are many things you can do to make your home safe and to help prevent falls. What can I do on the outside of my home?  Regularly fix the edges of walkways and driveways and fix any cracks.  Remove anything that might make you trip as you walk through a door, such as a raised step or threshold.  Trim any bushes or trees on the path to your home.  Use bright outdoor lighting.  Clear any walking paths of anything that might make someone trip, such as rocks or tools.  Regularly check to see if handrails are loose or broken. Make sure that both sides of any steps have handrails.  Any raised decks and porches should have guardrails on the edges.  Have any leaves, snow, or ice cleared regularly.  Use sand or salt on walking paths during winter.  Clean up any spills in your garage right away. This includes oil or grease spills. What can I do in the bathroom?  Use night lights.  Install grab bars by the toilet and in the tub and shower. Do not use towel bars as grab bars.  Use non-skid mats or decals in the tub or shower.  If you need to sit down in the shower, use a plastic, non-slip stool.  Keep the floor dry. Clean up any water that spills on the floor as soon as it happens.  Remove soap buildup in the tub or shower regularly.  Attach bath mats securely with double-sided non-slip rug tape.  Do not have throw rugs and other things on the floor that can make you trip. What can I do in the bedroom?  Use night lights.  Make sure  that you have a light by your bed that is easy to reach.  Do not use any sheets or blankets that are too big for your bed. They should not hang down onto the floor.  Have a firm chair that has side arms. You can use this for support while you get dressed.  Do not have throw rugs and other things on the floor that can make you trip. What can I do in the kitchen?  Clean up any spills right away.  Avoid walking on wet floors.  Keep items that you use a lot in easy-to-reach places.  If you need to reach something above you, use a strong step stool that has a grab bar.  Keep electrical cords out of the way.  Do not use floor polish or wax that makes floors slippery. If you must use wax, use non-skid floor wax.  Do not have throw rugs and other things on the floor that can make you trip. What can I do with my stairs?  Do not leave any items on the stairs.  Make sure that there are handrails on both  sides of the stairs and use them. Fix handrails that are broken or loose. Make sure that handrails are as long as the stairways.  Check any carpeting to make sure that it is firmly attached to the stairs. Fix any carpet that is loose or worn.  Avoid having throw rugs at the top or bottom of the stairs. If you do have throw rugs, attach them to the floor with carpet tape.  Make sure that you have a light switch at the top of the stairs and the bottom of the stairs. If you do not have them, ask someone to add them for you. What else can I do to help prevent falls?  Wear shoes that:  Do not have high heels.  Have rubber bottoms.  Are comfortable and fit you well.  Are closed at the toe. Do not wear sandals.  If you use a stepladder:  Make sure that it is fully opened. Do not climb a closed stepladder.  Make sure that both sides of the stepladder are locked into place.  Ask someone to hold it for you, if possible.  Clearly mark and make sure that you can see:  Any grab bars or  handrails.  First and last steps.  Where the edge of each step is.  Use tools that help you move around (mobility aids) if they are needed. These include:  Canes.  Walkers.  Scooters.  Crutches.  Turn on the lights when you go into a dark area. Replace any light bulbs as soon as they burn out.  Set up your furniture so you have a clear path. Avoid moving your furniture around.  If any of your floors are uneven, fix them.  If there are any pets around you, be aware of where they are.  Review your medicines with your doctor. Some medicines can make you feel dizzy. This can increase your chance of falling. Ask your doctor what other things that you can do to help prevent falls. This information is not intended to replace advice given to you by your health care provider. Make sure you discuss any questions you have with your health care provider. Document Released: 05/13/2009 Document Revised: 12/23/2015 Document Reviewed: 08/21/2014 Elsevier Interactive Patient Education  2017 [employer].

## 2019-07-21 NOTE — Progress Notes (Signed)
PCP notes:  Health Maintenance: Needs flu vaccine   Abnormal Screenings: none   Patient concerns: none   Nurse concerns: none   Next PCP appt.: 07/28/2019 @ 4 pm

## 2019-07-21 NOTE — Progress Notes (Signed)
Subjective:   Kenneth [name] is a 48 y.o. male who presents for Medicare Annual/Subsequent preventive examination.  Review of Systems: N/A   This visit is being conducted through telemedicine via telephone at the nurse health advisor's home address due to the COVID-19 pandemic. This patient has given me verbal consent via doximity to conduct this visit, patient states they are participating from their home address. Patient, Kenneth [name] (sister) and myself are on the telephone call. Spoke with patient's sister Kenneth [name]) with verbal consent from patient.  There is no referral for this visit. Some vital signs Chunn be absent or patient reported.    Patient identification: identified by name, DOB, and current address   Cardiac Risk Factors include: dyslipidemia;male gender     Objective:    Vitals: Ht 5\' 3"  (1.6 m)   Wt 160 lb (72.6 kg)   BMI 28.34 kg/m   Body mass index is 28.34 kg/m.  Advanced Directives 07/21/2019 12/03/2018 08/08/2018 11/21/2016 12/12/2015 02/11/2015 10/20/2012  Does Patient Have a Medical Advance Directive? No Yes No No No No Patient does not have advance directive;Patient would not like information  Type of Advance Directive - [place]  Would patient like information on creating a medical advance directive? Yes (MAU/Ambulatory/Procedural Areas - Information given) - No - Patient declined - No - patient declined information No - patient declined information -    Tobacco Social History   Tobacco Use  Smoking Status Never Smoker  Smokeless Tobacco Never Used     Counseling given: Not Answered   Clinical Intake:  Pre-visit preparation completed: Yes  Pain : No/denies pain     Nutritional Risks: None Diabetes: No  How often do you need to have someone help you when you read instructions, pamphlets, or other written materials from your doctor or pharmacy?: 1 - Never What is the last grade level you completed in  school?: 12th  Interpreter Needed?: No  Information entered by :: CJohnson, LPN  Past Medical History:  Diagnosis Date  . Cataracts, bilateral   . Congenital heart defect   . Down syndrome   . HLD (hyperlipidemia)   . Seizure disorder [facility])    History reviewed. No pertinent surgical history. Family History  Problem Relation Age of Onset  . Diabetes Mother   . Hypertension Mother        Cerebral hemorrhage  . Diabetes Father   . Hypertension Father   . Coronary artery disease Father 56       MI  . Stroke Father   . Cancer Maternal Grandmother        Oral  . Cancer Maternal Aunt        Colon   Social History   Socioeconomic History  . Marital status: Single    Spouse name: Not on file  . Number of children: 0  . Years of education: Not on file  . Highest education level: Not on file  Occupational History  . Occupation: Working at "the [employer]  . Smoking status: Never Smoker  . Smokeless tobacco: Never Used  Substance and Sexual Activity  . Alcohol use: No  . Drug use: No  . Sexual activity: Never  Other Topics Concern  . Not on file  Social History Narrative   Single   Lives with his sister and her family, 5 dogs, 1 cat, 4 goats, 20 chickens   [facility]  Diet: good water, fruits/vegetables daily   Social Determinants of Health   Financial Resource Strain: Low Risk   . Difficulty of Paying Living Expenses: Not hard at all  Food Insecurity: No Food Insecurity  . Worried About Programme researcher, broadcasting/film/video in the Last Year: Never true  . Ran Out of Food in the Last Year: Never true  Transportation Needs: No Transportation Needs  . Lack of Transportation (Medical): No  . Lack of Transportation (Non-Medical): No  Physical Activity: Inactive  . Days of Exercise per Week: 0 days  . Minutes of Exercise per Session: 0 min  Stress: No Stress Concern Present  . Feeling of Stress : Not at all  Social Connections:   . Frequency of  Communication with Friends and Family: Not on file  . Frequency of Social Gatherings with Friends and Family: Not on file  . Attends Religious Services: Not on file  . Active Member of Clubs or Organizations: Not on file  . Attends Banker Meetings: Not on file  . Marital Status: Not on file    Outpatient Encounter Medications as of 07/21/2019  Medication Sig  . Ascorbic Acid (VITAMIN C) 1000 MG tablet Take 1,000 mg by mouth daily.  . clotrimazole (LOTRIMIN AF) 1 % cream Apply 1 application topically 2 (two) times daily.  . diclofenac Sodium (VOLTAREN) 1 % GEL Apply 2 g topically 3 (three) times daily.  Marland Kitchen gabapentin (NEURONTIN) 100 MG capsule Take 200 mg by mouth every morning and 300 mg by mouth every evening.  . Ibuprofen 200 MG CAPS Take 400-600mg  twice daily with meals as needed for foot pain  . Multiple Vitamin (MULTIVITAMIN) tablet Take 1 tablet by mouth daily.  Marland Kitchen PHENobarbital (LUMINAL) 64.8 MG tablet TAKE 4 TABLETS BY MOUTH AT BEDTIME.  . pravastatin (PRAVACHOL) 10 MG tablet TAKE ONE TABLET BY MOUTH DAILY (PINK ROUND TABLET WITH APO PRA 10)  . QUEtiapine (SEROQUEL) 300 MG tablet Take 2 tablets (600 mg total) by mouth at bedtime.  . Undecylenic Acid (FUNGI-NAIL) 25 % SOLN Apply nail laquer to toenails daily  . vitamin B-12 (CYANOCOBALAMIN) 500 MCG tablet Take 500 mcg by mouth daily with breakfast.    No facility-administered encounter medications on file as of 07/21/2019.    Activities of Daily Living In your present state of health, do you have any difficulty performing the following activities: 07/21/2019  Hearing? N  Vision? N  Difficulty concentrating or making decisions? N  Walking or climbing stairs? N  Dressing or bathing? N  Doing errands, shopping? Y  Preparing Food and eating ? N  Using the Toilet? N  In the past six months, have you accidently leaked urine? N  Do you have problems with loss of bowel control? N  Managing your Medications? Y    Managing your Finances? Y  Housekeeping or managing your Housekeeping? Y  Some recent data might be hidden    Patient Care Team: Eustaquio Boyden, MD as PCP - General Karel Jarvis Lesle Chris, MD as Consulting Physician (Neurology)   Assessment:   This is a routine wellness examination for Heidi.  Exercise Activities and Dietary recommendations Current Exercise Habits: The patient does not participate in regular exercise at present, Exercise limited by: None identified  Goals    . Patient Stated     07/21/2019, I will maintain and continue medications as prescribed.       Fall Risk Fall Risk  07/21/2019 07/09/2019 12/03/2018 12/03/2017 05/29/2017  Falls in the past  year? 0 1 0 No No  Number falls in past yr: 0 1 0 - -  Injury with Fall? 0 1 0 - -  Risk for fall due to : Medication side effect History of fall(s);Impaired balance/gait - - -  Follow up Falls evaluation completed;Falls prevention discussed Falls evaluation completed - - -    Is the patient's home free of loose throw rugs in walkways, pet beds, electrical cords, etc?   yes      Grab bars in the bathroom? no      Handrails on the stairs?   no      Adequate lighting?   yes  Timed Get Up and Go Performed: N/A  Depression Screen PHQ 2/9 Scores 07/21/2019 07/23/2015 07/13/2014 07/11/2013  PHQ - 2 Score 0 0 0 0  PHQ- 9 Score 0 - - -    Cognitive Function MMSE - Mini Mental State Exam 07/21/2019  Not completed: Unable to complete       Mini Cog  Mini-Cog screen was not completed. Patient not mentally able to compete at this time. Maximum score is 22. A value of 0 denotes this part of the MMSE was not completed or the patient failed this part of the Mini-Cog screening.  Immunization History  Administered Date(s) Administered  . Influenza Split 05/02/2012  . Influenza Whole 04/18/2010, 04/06/2011  . Influenza,inj,Quad PF,6+ Mos 07/11/2013, 07/13/2014, 07/23/2015, 08/08/2016  . Td 10/30/1998, 04/28/2009  . Tdap  11/06/2013    Qualifies for Shingles Vaccine? N/A  Screening Tests Health Maintenance  Topic Date Due  . HIV Screening  06/12/1986  . INFLUENZA VACCINE  03/01/2019  . DTaP/Tdap/Td (2 - Td) 11/07/2023  . TETANUS/TDAP  11/07/2023   Cancer Screenings: Lung: Low Dose CT Chest recommended if Age 80-80 years, 30 pack-year currently smoking OR have quit w/in 15years. Patient does not qualify. Colorectal: @ age 48   Additional Screenings:  Hepatitis C Screening: N/A      Plan:   Patient will maintain and continue medications as prescribed.   I have personally reviewed and noted the following in the patient's chart:   . Medical and social history . Use of alcohol, tobacco or illicit drugs  . Current medications and supplements . Functional ability and status . Nutritional status . Physical activity . Advanced directives . List of other physicians . Hospitalizations, surgeries, and ER visits in previous 12 months . Vitals . Screenings to include cognitive, depression, and falls . Referrals and appointments  In addition, I have reviewed and discussed with patient certain preventive protocols, quality metrics, and best practice recommendations. A written personalized care plan for preventive services as well as general preventive health recommendations were provided to patient.     Janalyn ShyJohnson, Javionna Leder, LPN  [id number]/21/2020

## 2019-07-22 ENCOUNTER — Other Ambulatory Visit: Payer: Self-pay | Admitting: Family Medicine

## 2019-07-22 ENCOUNTER — Other Ambulatory Visit: Payer: Medicare Other

## 2019-07-22 ENCOUNTER — Other Ambulatory Visit: Payer: Self-pay

## 2019-07-22 DIAGNOSIS — E538 Deficiency of other specified B group vitamins: Secondary | ICD-10-CM

## 2019-07-22 DIAGNOSIS — E785 Hyperlipidemia, unspecified: Secondary | ICD-10-CM

## 2019-07-22 DIAGNOSIS — G40909 Epilepsy, unspecified, not intractable, without status epilepticus: Secondary | ICD-10-CM

## 2019-07-28 ENCOUNTER — Ambulatory Visit (INDEPENDENT_AMBULATORY_CARE_PROVIDER_SITE_OTHER): Payer: Medicare Other | Admitting: Family Medicine

## 2019-07-28 ENCOUNTER — Encounter: Payer: Self-pay | Admitting: Family Medicine

## 2019-07-28 ENCOUNTER — Other Ambulatory Visit: Payer: Self-pay

## 2019-07-28 VITALS — BP 112/70 | HR 63 | Temp 97.7°F | Ht 63.5 in | Wt 160.4 lb

## 2019-07-28 DIAGNOSIS — M2141 Flat foot [pes planus] (acquired), right foot: Secondary | ICD-10-CM

## 2019-07-28 DIAGNOSIS — G40909 Epilepsy, unspecified, not intractable, without status epilepticus: Secondary | ICD-10-CM

## 2019-07-28 DIAGNOSIS — H269 Unspecified cataract: Secondary | ICD-10-CM

## 2019-07-28 DIAGNOSIS — E785 Hyperlipidemia, unspecified: Secondary | ICD-10-CM | POA: Diagnosis not present

## 2019-07-28 DIAGNOSIS — Q909 Down syndrome, unspecified: Secondary | ICD-10-CM | POA: Diagnosis not present

## 2019-07-28 DIAGNOSIS — E538 Deficiency of other specified B group vitamins: Secondary | ICD-10-CM | POA: Diagnosis not present

## 2019-07-28 DIAGNOSIS — M4180 Other forms of scoliosis, site unspecified: Secondary | ICD-10-CM

## 2019-07-28 DIAGNOSIS — Z1211 Encounter for screening for malignant neoplasm of colon: Secondary | ICD-10-CM

## 2019-07-28 DIAGNOSIS — M2142 Flat foot [pes planus] (acquired), left foot: Secondary | ICD-10-CM

## 2019-07-28 NOTE — Patient Instructions (Addendum)
Flu shot today Pass by lab to pick up stool kit.  Blood work today. Good to see you today, call us with questions. Return as needed or in 6 months for follow up visit.   Health Maintenance, Male Adopting a healthy lifestyle and getting preventive care are important in promoting health and wellness. Ask your health care provider about:  The right schedule for you to have regular tests and exams.  Things you can do on your own to prevent diseases and keep yourself healthy. What should I know about diet, weight, and exercise? Eat a healthy diet   Eat a diet that includes plenty of vegetables, fruits, low-fat dairy products, and lean protein.  Do not eat a lot of foods that are high in solid fats, added sugars, or sodium. Maintain a healthy weight Body mass index (BMI) is a measurement that can be used to identify possible weight problems. It estimates body fat based on height and weight. Your health care provider can help determine your BMI and help you achieve or maintain a healthy weight. Get regular exercise Get regular exercise. This is one of the most important things you can do for your health. Most adults should:  Exercise for at least 150 minutes each week. The exercise should increase your heart rate and make you sweat (moderate-intensity exercise).  Do strengthening exercises at least twice a week. This is in addition to the moderate-intensity exercise.  Spend less time sitting. Even light physical activity can be beneficial. Watch cholesterol and blood lipids Have your blood tested for lipids and cholesterol at 48 years of age, then have this test every 5 years. You Stuhr need to have your cholesterol levels checked more often if:  Your lipid or cholesterol levels are high.  You are older than 48 years of age.  You are at high risk for heart disease. What should I know about cancer screening? Many types of cancers can be detected early and Sweeting often be prevented.  Depending on your health history and family history, you Krantz need to have cancer screening at various ages. This Joles include screening for:  Colorectal cancer.  Prostate cancer.  Skin cancer.  Lung cancer. What should I know about heart disease, diabetes, and high blood pressure? Blood pressure and heart disease  High blood pressure causes heart disease and increases the risk of stroke. This is more likely to develop in people who have high blood pressure readings, are of African descent, or are overweight.  Talk with your health care provider about your target blood pressure readings.  Have your blood pressure checked: ? Every 3-5 years if you are 62-78 years of age. ? Every year if you are 80 years old or older.  If you are between the ages of 73 and 73 and are a current or former smoker, ask your health care provider if you should have a one-time screening for abdominal aortic aneurysm (AAA). Diabetes Have regular diabetes screenings. This checks your fasting blood sugar level. Have the screening done:  Once every three years after age 32 if you are at a normal weight and have a low risk for diabetes.  More often and at a younger age if you are overweight or have a high risk for diabetes. What should I know about preventing infection? Hepatitis B If you have a higher risk for hepatitis B, you should be screened for this virus. Talk with your health care provider to find out if you are at risk for  hepatitis B infection. Hepatitis C Blood testing is recommended for:  Everyone born from [birth year] through 1965.  Anyone with known risk factors for hepatitis C. Sexually transmitted infections (STIs)  You should be screened each year for STIs, including gonorrhea and chlamydia, if: ? You are sexually active and are younger than 48 years of age. ? You are older than 48 years of age and your health care provider tells you that you are at risk for this type of infection. ? Your sexual  activity has changed since you were last screened, and you are at increased risk for chlamydia or gonorrhea. Ask your health care provider if you are at risk.  Ask your health care provider about whether you are at high risk for HIV. Your health care provider [place] recommend a prescription medicine to help prevent HIV infection. If you choose to take medicine to prevent HIV, you should first get tested for HIV. You should then be tested every 3 months for as long as you are taking the medicine. Follow these instructions at home: Lifestyle  Do not use any products that contain nicotine or tobacco, such as cigarettes, e-cigarettes, and chewing tobacco. If you need help quitting, ask your health care provider.  Do not use street drugs.  Do not share needles.  Ask your health care provider for help if you need support or information about quitting drugs. Alcohol use  Do not drink alcohol if your health care provider tells you not to drink.  If you drink alcohol: ? Limit how much you have to 0-2 drinks a day. ? Be aware of how much alcohol is in your drink. In the [place] one drink equals one 12 oz bottle of beer (355 mL), one 5 oz glass of wine (148 mL), or one 1 oz glass of hard liquor (44 mL). General instructions  Schedule regular health, dental, and eye exams.  Stay current with your vaccines.  Tell your health care provider if: ? You often feel depressed. ? You have ever been abused or do not feel safe at home. Summary  Adopting a healthy lifestyle and getting preventive care are important in promoting health and wellness.  Follow your health care provider's instructions about healthy diet, exercising, and getting tested or screened for diseases.  Follow your health care provider's instructions on monitoring your cholesterol and blood pressure. This information is not intended to replace advice given to you by your health care provider. Make sure you discuss any questions you have  with your health care provider. Document Released: 01/13/2008 Document Revised: 07/10/2018 Document Reviewed: 07/10/2018 Elsevier Patient Education  2020 [employer].

## 2019-07-28 NOTE — Assessment & Plan Note (Signed)
Evaluating further treatment for this.

## 2019-07-28 NOTE — Assessment & Plan Note (Signed)
Pending podiatry evaluation.

## 2019-07-28 NOTE — Assessment & Plan Note (Signed)
Continue phenobarb and gabapentin. Appreciate neurology care.

## 2019-07-28 NOTE — Assessment & Plan Note (Signed)
Continue daily b12 replacement. Update levels.

## 2019-07-28 NOTE — Assessment & Plan Note (Signed)
Update labs on low dose pravastatin.  The 10-year ASCVD risk score Mikey Bussing DC Brooke Bonito., et al., 2013) is: 1.5%   Values used to calculate the score:     Age: 48 years     Sex: Male     Is Non-Hispanic African American: No     Diabetic: No     Tobacco smoker: No     Systolic Blood Pressure: 604 mmHg     Is BP treated: No     HDL Cholesterol: 57.8 mg/dL     Total Cholesterol: 171 mg/dL

## 2019-07-28 NOTE — Progress Notes (Signed)
This visit was conducted in person.  BP 112/70 (BP Location: Right Arm, Patient Position: Sitting, Cuff Size: Normal)   Pulse 63   Temp 97.7 F (36.5 C) (Temporal)   Ht 5' 3.5" (1.613 m)   Wt 160 lb 7 oz (72.8 kg)   SpO2 97%   BMI 27.97 kg/m    CC: AMW f/u visit Subjective:    Patient ID: Kenneth [name], male    DOB: 1970/11/11, 48 y.o.   MRN: [id number]  HPI: Kenneth [name] is a 48 y.o. male presenting on 07/28/2019 for Annual Exam (Prt 2.  Pt accompanied by sister, Myrtie Soman, 97.7].) and Form Completion (Needs Community Support Service form completed. )   Saw health advisor last week for medicare wellness visit. Note reviewed.     No exam data present    Clinical Support from 07/21/2019 in [place] [facility] Total Score  0      Fall Risk  07/21/2019 07/09/2019 12/03/2018 12/03/2017 05/29/2017  Falls in the past year? 0 1 0 No No  Number falls in past yr: 0 1 0 - -  Injury with Fall? 0 1 0 - -  Risk for fall due to : Medication side effect History of fall(s);Impaired balance/gait - - -  Follow up Falls evaluation completed;Falls prevention discussed Falls evaluation completed - - -      Preventative: Colon cancer screening - discussed new guidelines recommending starting age 68 but insurances [place] not be covering yet. Discussed options - they agree to start with iFOB.  Flu shot today  Tdap 10/2013  Advanced directives: Handout previously provided. HCPOA is sister Lynelle Smoke. Seat belt use discussed  Sunscreen use discussed. No changing moles on skin.  Non smoker Alcohol - none Dentist - full dentures - but doesn't use - still eats everything  Eye exam yearly - to see 08/2019. Watching cataracts   Lives with his sister and her family, 5 dogs, 1 cat, 4 goats, 20 chickens  [place]es to [employer] on weekdays - not currently due to pandemic.  [facility] [employer] - not currently due to pandemic.  Diet: good water,  fruits/vegetables      Relevant past medical, surgical, family and social history reviewed and updated as indicated. Interim medical history since our last visit reviewed. Allergies and medications reviewed and updated. Outpatient Medications Prior to Visit  Medication Sig Dispense Refill  . clotrimazole (LOTRIMIN AF) 1 % cream Apply 1 application topically 2 (two) times daily.    . diclofenac Sodium (VOLTAREN) 1 % GEL Apply 2 g topically 3 (three) times daily. 100 g 1  . gabapentin (NEURONTIN) 100 MG capsule Take 200 mg by mouth every morning and 300 mg by mouth every evening. 150 capsule 11  . Ibuprofen 200 MG CAPS Take 400-647m twice daily with meals as needed for foot pain    . Multiple Vitamin (MULTIVITAMIN) tablet Take 1 tablet by mouth daily.    .Marland KitchenPHENobarbital (LUMINAL) 64.8 MG tablet TAKE 4 TABLETS BY MOUTH AT BEDTIME. 120 tablet 5  . pravastatin (PRAVACHOL) 10 MG tablet TAKE ONE TABLET BY MOUTH DAILY (PINK ROUND TABLET WITH APO PRA 10) 30 tablet 0  . QUEtiapine (SEROQUEL) 300 MG tablet Take 2 tablets (600 mg total) by mouth at bedtime. 60 tablet 11  . Undecylenic Acid (FUNGI-NAIL) 25 % SOLN Apply nail laquer to toenails daily 480 mL 0  . vitamin B-12 (CYANOCOBALAMIN) 500 MCG tablet  Take 500 mcg by mouth daily with breakfast.     . Ascorbic Acid (VITAMIN C) 1000 MG tablet Take 1,000 mg by mouth daily.     No facility-administered medications prior to visit.     Per HPI unless specifically indicated in ROS section below Review of Systems  Constitutional: Negative for activity change, appetite change, chills, fatigue, fever and unexpected weight change.  HENT: Negative for hearing loss.   Eyes: Negative for visual disturbance.  Respiratory: Negative for cough, chest tightness, shortness of breath and wheezing.   Cardiovascular: Positive for leg swelling (L foot). Negative for chest pain and palpitations.  Gastrointestinal: Negative for abdominal distention, abdominal pain, blood  in stool, constipation, diarrhea, nausea and vomiting.  Genitourinary: Negative for difficulty urinating and hematuria.  Musculoskeletal: Negative for arthralgias, myalgias and neck pain.  Skin: Negative for rash.  Neurological: Negative for dizziness, seizures, syncope and headaches.  Hematological: Negative for adenopathy. Does not bruise/bleed easily.  Psychiatric/Behavioral: Negative for dysphoric mood. The patient is not nervous/anxious.    Objective:    BP 112/70 (BP Location: Right Arm, Patient Position: Sitting, Cuff Size: Normal)   Pulse 63   Temp 97.7 F (36.5 C) (Temporal)   Ht 5' 3.5" (1.613 m)   Wt 160 lb 7 oz (72.8 kg)   SpO2 97%   BMI 27.97 kg/m   Wt Readings from Last 3 Encounters:  07/28/19 160 lb 7 oz (72.8 kg)  07/21/19 160 lb (72.6 kg)  07/09/19 157 lb (71.2 kg)    Physical Exam Vitals and nursing note reviewed.  Constitutional:      General: He is not in acute distress.    Appearance: Normal appearance. He is well-developed. He is not ill-appearing.  HENT:     Head: Normocephalic and atraumatic.     Right Ear: Hearing, tympanic membrane, ear canal and external ear normal.     Left Ear: Hearing, tympanic membrane, ear canal and external ear normal.     Nose: Nose normal.     Mouth/Throat:     Pharynx: Uvula midline.  Eyes:     General: No scleral icterus.    Extraocular Movements: Extraocular movements intact.     Conjunctiva/sclera: Conjunctivae normal.     Pupils: Pupils are equal, round, and reactive to light.  Cardiovascular:     Rate and Rhythm: Normal rate and regular rhythm.     Pulses: Normal pulses.          Radial pulses are 2+ on the right side and 2+ on the left side.     Heart sounds: Normal heart sounds. No murmur.  Pulmonary:     Effort: Pulmonary effort is normal. No respiratory distress.     Breath sounds: Normal breath sounds. No wheezing, rhonchi or rales.  Abdominal:     General: Abdomen is flat. Bowel sounds are normal. There  is no distension.     Palpations: Abdomen is soft. There is no mass.     Tenderness: There is no abdominal tenderness. There is no guarding or rebound.     Hernia: No hernia is present.  Musculoskeletal:        General: Normal range of motion.     Cervical back: Normal range of motion and neck supple.     Right lower leg: No edema.     Left lower leg: No edema.  Lymphadenopathy:     Cervical: No cervical adenopathy.  Skin:    General: Skin is warm and dry.  Findings: No rash.  Neurological:     General: No focal deficit present.     Mental Status: He is alert and oriented to person, place, and time.     Comments: CN grossly intact, station and gait intact  Psychiatric:        Mood and Affect: Mood normal.        Behavior: Behavior normal.        Thought Content: Thought content normal.        Judgment: Judgment normal.       Results for orders placed or performed in visit on 03/25/18  Vitamin B12  Result Value Ref Range   Vitamin B-12 576 211 - 911 pg/mL  Lipid panel  Result Value Ref Range   Cholesterol 171 0 - 200 mg/dL   Triglycerides 77.0 0.0 - 149.0 mg/dL   HDL 57.80 >39.00 mg/dL   VLDL 15.4 0.0 - 40.0 mg/dL   LDL Cholesterol 98 0 - 99 mg/dL   Total CHOL/HDL Ratio 3    NonHDL 113.60   Comprehensive metabolic panel  Result Value Ref Range   Sodium 137 135 - 145 mEq/L   Potassium 4.3 3.5 - 5.1 mEq/L   Chloride 100 96 - 112 mEq/L   CO2 32 19 - 32 mEq/L   Glucose, Bld 94 70 - 99 mg/dL   BUN 22 6 - 23 mg/dL   Creatinine, Ser 1.37 0.40 - 1.50 mg/dL   Total Bilirubin 0.3 0.2 - 1.2 mg/dL   Alkaline Phosphatase 132 (H) 39 - 117 U/L   AST 16 0 - 37 U/L   ALT 11 0 - 53 U/L   Total Protein 7.4 6.0 - 8.3 g/dL   Albumin 3.8 3.5 - 5.2 g/dL   Calcium 9.2 8.4 - 10.5 mg/dL   GFR 59.25 (L) >60.00 mL/min  CBC with Differential/Platelet  Result Value Ref Range   WBC 3.6 (L) 4.0 - 10.5 K/uL   RBC 3.75 (L) 4.22 - 5.81 Mil/uL   Hemoglobin 13.0 13.0 - 17.0 g/dL   HCT 38.2  (L) 39.0 - 52.0 %   MCV 101.8 (H) 78.0 - 100.0 fl   MCHC 34.0 30.0 - 36.0 g/dL   RDW 14.7 11.5 - 15.5 %   Platelets 243.0 150.0 - 400.0 K/uL   Neutrophils Relative % 59.2 43.0 - 77.0 %   Lymphocytes Relative 25.1 12.0 - 46.0 %   Monocytes Relative 12.7 (H) 3.0 - 12.0 %   Eosinophils Relative 0.9 0.0 - 5.0 %   Basophils Relative 2.1 0.0 - 3.0 %   Neutro Abs 2.1 1.4 - 7.7 K/uL   Lymphs Abs 0.9 0.7 - 4.0 K/uL   Monocytes Absolute 0.5 0.1 - 1.0 K/uL   Eosinophils Absolute 0.0 0.0 - 0.7 K/uL   Basophils Absolute 0.1 0.0 - 0.1 K/uL  Phenobarbital level  Result Value Ref Range   Phenobarbital, Serum 37.4 15.0 - 40.0 mg/L   Assessment & Plan:  This visit occurred during the SARS-CoV-2 public health emergency.  Safety protocols were in place, including screening questions prior to the visit, additional usage of staff PPE, and extensive cleaning of exam room while observing appropriate contact time as indicated for disinfecting solutions.  Forms filled out today.  Problem List Items Addressed This Visit    Vitamin B12 deficiency    Continue daily b12 replacement. Update levels.       Seizure disorder ([place])    Continue phenobarb and gabapentin. Appreciate neurology care.  Rotoscoliosis   Pes planus    Pending podiatry evaluation.       HLD (hyperlipidemia)    Update labs on low dose pravastatin.  The 10-year ASCVD risk score Mikey Bussing DC Brooke Bonito., et al., 2013) is: 1.5%   Values used to calculate the score:     Age: 7 years     Sex: Male     Is Non-Hispanic African American: No     Diabetic: No     Tobacco smoker: No     Systolic Blood Pressure: 722 mmHg     Is BP treated: No     HDL Cholesterol: 57.8 mg/dL     Total Cholesterol: 171 mg/dL       Relevant Orders   TSH   Down syndrome - Primary    Stable period without increased level of care needs noted. Evaluating possible cataract removal surgery.       Bilateral cataracts    Evaluating further treatment for this.          Other Visit Diagnoses    Special screening for malignant neoplasms, colon       Relevant Orders   Fecal occult blood, imunochemical       No orders of the defined types were placed in this encounter.  Orders Placed This Encounter  Procedures  . Fecal occult blood, imunochemical    Standing Status:   Future    Standing Expiration Date:   07/27/2020  . TSH   Patient instructions: Flu shot today Pass by lab to pick up stool kit.  Blood work today. Good to see you today, call us with questions. Return as needed or in 6 months for follow up visit.   Follow up plan: Return in about 6 months (around 01/26/2020) for follow up visit.  Ria Bush, MD

## 2019-07-28 NOTE — Assessment & Plan Note (Signed)
Stable period without increased level of care needs noted. Evaluating possible cataract removal surgery.

## 2019-07-29 LAB — COMPREHENSIVE METABOLIC PANEL
ALT: 8 U/L (ref 0–53)
AST: 13 U/L (ref 0–37)
Albumin: 4 g/dL (ref 3.5–5.2)
Alkaline Phosphatase: 122 U/L — ABNORMAL HIGH (ref 39–117)
BUN: 18 mg/dL (ref 6–23)
CO2: 30 mEq/L (ref 19–32)
Calcium: 9.3 mg/dL (ref 8.4–10.5)
Chloride: 101 mEq/L (ref 96–112)
Creatinine, Ser: 1.32 mg/dL (ref 0.40–1.50)
GFR: 57.86 mL/min — ABNORMAL LOW (ref >60.00)
Glucose, Bld: 94 mg/dL (ref 70–99)
Potassium: 4.1 mEq/L (ref 3.5–5.1)
Sodium: 139 mEq/L (ref 135–145)
Total Bilirubin: 0.3 mg/dL (ref 0.2–1.2)
Total Protein: 7.5 g/dL (ref 6.0–8.3)

## 2019-07-29 LAB — LIPID PANEL
Cholesterol: 195 mg/dL (ref 0–200)
HDL: 59.6 mg/dL (ref >39.00)
LDL Cholesterol: 118 mg/dL — ABNORMAL HIGH (ref 0–99)
NonHDL: 135.54
Total CHOL/HDL Ratio: 3
Triglycerides: 86 mg/dL (ref 0.0–149.0)
VLDL: 17.2 mg/dL (ref 0.0–40.0)

## 2019-07-29 LAB — CBC WITH DIFFERENTIAL/PLATELET
Basophils Absolute: 0.1 10*3/uL (ref 0.0–0.1)
Basophils Relative: 1.3 % (ref 0.0–3.0)
Eosinophils Absolute: 0.1 10*3/uL (ref 0.0–0.7)
Eosinophils Relative: 1.3 % (ref 0.0–5.0)
HCT: 38.7 % — ABNORMAL LOW (ref 39.0–52.0)
Hemoglobin: 12.9 g/dL — ABNORMAL LOW (ref 13.0–17.0)
Lymphocytes Relative: 26.2 % (ref 12.0–46.0)
Lymphs Abs: 1.3 10*3/uL (ref 0.7–4.0)
MCHC: 33.4 g/dL (ref 30.0–36.0)
MCV: 102.6 fl — ABNORMAL HIGH (ref 78.0–100.0)
Monocytes Absolute: 0.5 10*3/uL (ref 0.1–1.0)
Monocytes Relative: 10 % (ref 3.0–12.0)
Neutro Abs: 3 10*3/uL (ref 1.4–7.7)
Neutrophils Relative %: 61.2 % (ref 43.0–77.0)
Platelets: 234 10*3/uL (ref 150.0–400.0)
RBC: 3.77 Mil/uL — ABNORMAL LOW (ref 4.22–5.81)
RDW: 14.8 % (ref 11.5–15.5)
WBC: 5 10*3/uL (ref 4.0–10.5)

## 2019-07-29 LAB — TSH: TSH: 1.97 u[IU]/mL (ref 0.35–4.50)

## 2019-07-29 LAB — VITAMIN B12: Vitamin B-12: 743 pg/mL (ref 211–911)

## 2019-07-31 LAB — PHENOBARBITAL LEVEL: Phenobarbital, Serum: 38.2 mg/L (ref 15.0–40.0)

## 2019-08-06 ENCOUNTER — Other Ambulatory Visit: Payer: Self-pay | Admitting: Family Medicine

## 2019-09-08 ENCOUNTER — Other Ambulatory Visit: Payer: Self-pay | Admitting: Family Medicine

## 2019-09-30 ENCOUNTER — Ambulatory Visit (INDEPENDENT_AMBULATORY_CARE_PROVIDER_SITE_OTHER): Payer: 59 | Admitting: Podiatry

## 2019-09-30 ENCOUNTER — Ambulatory Visit (INDEPENDENT_AMBULATORY_CARE_PROVIDER_SITE_OTHER): Payer: 59

## 2019-09-30 ENCOUNTER — Other Ambulatory Visit: Payer: Self-pay

## 2019-09-30 ENCOUNTER — Other Ambulatory Visit: Payer: Self-pay | Admitting: Podiatry

## 2019-09-30 DIAGNOSIS — M2142 Flat foot [pes planus] (acquired), left foot: Secondary | ICD-10-CM | POA: Diagnosis not present

## 2019-09-30 DIAGNOSIS — M79672 Pain in left foot: Secondary | ICD-10-CM

## 2019-09-30 DIAGNOSIS — M2141 Flat foot [pes planus] (acquired), right foot: Secondary | ICD-10-CM

## 2019-10-01 ENCOUNTER — Encounter: Payer: Self-pay | Admitting: Podiatry

## 2019-10-01 NOTE — Progress Notes (Signed)
Subjective:  Patient ID: Kenneth [name], male    DOB: April 23, 1971,  MRN: [id number]  Chief Complaint  Patient presents with  . Foot Pain    pt has left foot pain, pt states the pain has been going on for 2-3 months, pt states that his pain has gotten better since he saw his pcp about a month ago, and was prescrived an otc cream.    49 y.o. male presents with the above complaint. Patient presents with complaint of arch pain especially on the left side more so than the right side. Patient states that this has been going for about 2 to 3 months has progressively gotten worse. It is right in the arch and medial aspect of the foot. Patient is here with his caretaker who states that he goes through shoes and has worn them out every month. It is getting expensive to continuously buying shoes every month. Patient tends to ambulate 5 to 6 miles a day which could be leading to the wear and tear on the shoes. Patient denies having any kind of orthotics to help control the way he walks. He denies any other acute complaints. He would like to know if there is anything that could be done for this.   Review of Systems: Negative except as noted in the HPI. Denies N/V/F/Ch.  Past Medical History:  Diagnosis Date  . Cataracts, bilateral   . Congenital heart defect   . Down syndrome   . HLD (hyperlipidemia)   . Seizure disorder ([place])     Current Outpatient Medications:  .  clotrimazole (LOTRIMIN AF) 1 % cream, Apply 1 application topically 2 (two) times daily., Disp: , Rfl:  .  diclofenac Sodium (VOLTAREN) 1 % GEL, Apply 2 g topically 3 (three) times daily., Disp: 100 g, Rfl: 1 .  gabapentin (NEURONTIN) 100 MG capsule, Take 200 mg by mouth every morning and 300 mg by mouth every evening., Disp: 150 capsule, Rfl: 11 .  Ibuprofen 200 MG CAPS, Take 400-600mg  twice daily with meals as needed for foot pain, Disp: , Rfl:  .  Multiple Vitamin (MULTIVITAMIN) tablet, Take 1 tablet by mouth daily., Disp: , Rfl:  .   PHENobarbital (LUMINAL) 64.8 MG tablet, TAKE 4 TABLETS BY MOUTH AT BEDTIME., Disp: 120 tablet, Rfl: 5 .  pravastatin (PRAVACHOL) 10 MG tablet, TAKE ONE TABLET BY MOUTH DAILY (PINK ROUND TABLET WITH APO PRA 10), Disp: 31 tablet, Rfl: 0 .  QUEtiapine (SEROQUEL) 300 MG tablet, Take 2 tablets (600 mg total) by mouth at bedtime., Disp: 60 tablet, Rfl: 11 .  Undecylenic Acid (FUNGI-NAIL) 25 % SOLN, Apply nail laquer to toenails daily, Disp: 480 mL, Rfl: 0 .  vitamin B-12 (CYANOCOBALAMIN) 500 MCG tablet, Take 500 mcg by mouth daily with breakfast. , Disp: , Rfl:   Social History   Tobacco Use  Smoking Status Never Smoker  Smokeless Tobacco Never Used    Allergies  Allergen Reactions  . Amoxicillin-Pot Clavulanate Palpitations and Other (See Comments)    "Felt like I was having a heart attack." Has patient had a PCN reaction causing immediate rash, facial/tongue/throat swelling, SOB or lightheadedness with hypotension: No Has patient had a PCN reaction causing severe rash involving mucus membranes or skin necrosis: No Has patient had a PCN reaction that required hospitalization: No Has patient had a PCN reaction occurring within the last 10 years: No If all of the above answers are "NO", then [place] proceed with Cephalosporin use.  unknown  . Penicillins Itching,  Rash and Palpitations    Has patient had a PCN reaction causing immediate rash, facial/tongue/throat swelling, SOB or lightheadedness with hypotension: No Has patient had a PCN reaction causing severe rash involving mucus membranes or skin necrosis: No Has patient had a PCN reaction that required hospitalization: Unknown Has patient had a PCN reaction occurring within the last 10 years: No If all of the above answers are "NO", then [place] proceed with Cephalosporin use.  "Felt like I was having a heart attack." Has patient had a PCN reaction causing immediate rash, facial/tongue/throat swelling, SOB or lightheadedness with hypotension:  No Has patient had a PCN reaction causing severe rash involving mucus membranes or skin necrosis: No Has patient had a PCN reaction that required hospitalization: No Has patient had a PCN reaction occurring within the last 10 years: No If all of the above answers are "NO", then [place] proceed with Cephalosporin use. Has patient had a PCN reaction causing immediate rash, facial/tongue/throat swelling, SOB or lightheadedness with hypotension: No Has patient had a PCN reaction causing severe rash involving mucus membranes or skin necrosis: No Has patient had a PCN reaction that required hospitalization: Unknown Has patient had a PCN reaction occurring within the last 10 years: No If all of the above answers are "NO", then [place] proceed with Cephalosporin use.   Objective:  There were no vitals filed for this visit. There is no height or weight on file to calculate BMI. Constitutional Well developed. Well nourished.  Vascular Dorsalis pedis pulses palpable bilaterally. Posterior tibial pulses palpable bilaterally. Capillary refill normal to all digits.  No cyanosis or clubbing noted. Pedal hair growth normal.  Neurologic Normal speech. Oriented to person, place, and time. Epicritic sensation to light touch grossly present bilaterally.  Dermatologic Nails well groomed and normal in appearance. No open wounds. No skin lesions.  Orthopedic:  Pain on palpation to the left arch of the foot. No pain at the plantar fascia origin or along the band of the fascia. Gait examination shows calcaneal eversion with too many toe signs unable to recreate the arch with dorsiflexion of the hallux. These findings are consistent bilaterally. No pain at the Achilles tendon, posterior tibial tendon, ATFL, peroneal tendon   Radiographs: 2 views of skeletally mature adult foot: There is decreasing calcaneal inclination angle increase in talar declination angle. There is anterior break in the cyma line. No elevatus noted.  Sesamoid position appears to be in good alignment. Accessory sesamoids noted across each of the metatarsal heads. Respectively. No posterior or hindfoot heel spurring noted. Assessment:   1. Foot pain, left   2. Arch pain, left   3. Pes planus of both feet    Plan:  Patient was evaluated and treated and all questions answered.  Left rigid pes planus deformity with associated arch pain -I explained to the patient the etiology of his arch pain as well as this relationship with pes planus deformity. Given that patient has rigid pes planus findings I believe patient will benefit from custom-made orthotics to help control the hindfoot motion and support the arches of the foot. I discussed with the patient that he will benefit from custom-made orthotics which will also prevent him from wearing out his shoes so often given how rigid his pes planus is. Patient states understanding will obtain orthotics as soon as possible. -I also discussed with the patient the financial consideration/burden. Patient states understanding and will still obtain them.  Return for See Raiford Noble for orthotics ASAP.

## 2019-10-02 ENCOUNTER — Other Ambulatory Visit: Payer: Self-pay | Admitting: Family Medicine

## 2019-10-29 ENCOUNTER — Other Ambulatory Visit: Payer: 59 | Admitting: Orthotics

## 2019-12-03 ENCOUNTER — Other Ambulatory Visit: Payer: Self-pay | Admitting: Neurology

## 2019-12-03 MED ORDER — PHENOBARBITAL 64.8 MG PO TABS: ORAL_TABLET | ORAL | 3 refills | Status: DC

## 2020-01-26 ENCOUNTER — Ambulatory Visit: Payer: Medicare Other | Admitting: Family Medicine

## 2020-01-29 ENCOUNTER — Ambulatory Visit (INDEPENDENT_AMBULATORY_CARE_PROVIDER_SITE_OTHER): Payer: 59 | Admitting: Family Medicine

## 2020-01-29 ENCOUNTER — Other Ambulatory Visit: Payer: Self-pay

## 2020-01-29 ENCOUNTER — Encounter: Payer: Self-pay | Admitting: Family Medicine

## 2020-01-29 VITALS — BP 112/66 | HR 70 | Temp 97.7°F | Ht 63.5 in | Wt 163.6 lb

## 2020-01-29 DIAGNOSIS — M2141 Flat foot [pes planus] (acquired), right foot: Secondary | ICD-10-CM

## 2020-01-29 DIAGNOSIS — M2142 Flat foot [pes planus] (acquired), left foot: Secondary | ICD-10-CM

## 2020-01-29 DIAGNOSIS — G40909 Epilepsy, unspecified, not intractable, without status epilepticus: Secondary | ICD-10-CM

## 2020-01-29 DIAGNOSIS — E785 Hyperlipidemia, unspecified: Secondary | ICD-10-CM

## 2020-01-29 DIAGNOSIS — Q909 Down syndrome, unspecified: Secondary | ICD-10-CM

## 2020-01-29 DIAGNOSIS — R451 Restlessness and agitation: Secondary | ICD-10-CM

## 2020-01-29 DIAGNOSIS — E041 Nontoxic single thyroid nodule: Secondary | ICD-10-CM

## 2020-01-29 MED ORDER — PRAVASTATIN SODIUM 10 MG PO TABS: 10.0000 mg | ORAL_TABLET | Freq: Every day | ORAL | 11 refills | Status: AC

## 2020-01-29 NOTE — Progress Notes (Signed)
This visit was conducted in person.  BP 112/66 (BP Location: Left Arm, Patient Position: Sitting, Cuff Size: Normal)   Pulse 70   Temp 97.7 F (36.5 C) (Temporal)   Ht 5' 3.5" (1.613 m)   Wt 163 lb 9 oz (74.2 kg)   SpO2 96%   BMI 28.52 kg/m    CC: 6 mo f/u visit  Subjective:    Patient ID: Kenneth [name] [birth year]22/[birth year]/28, male    DOB: 13-Sep-1970, 49 y.o.   MRN: [id number]  HPI: Kenneth [name] is a 49 y.o. male presenting on 01/29/2020 for Follow-up (Here for 6 mo f/u.  Pt accompanied by sister, Kenneth [name]- temp 97.9.), Form Completion (Has forms to be completed. ), and Insomnia (Kenneth [name]'s husband passed away 0/2021 and pt has had trouble sleeping since. )   Here to fill out forms for [employer].  Did not return iFOB.   Caregiver Kenneth [name]'s husband passed away 2019-12-28 with COVID.  Kenneth [name] is having more trouble with falling asleep, worried people are coming into the house. This is despite seroquel 600mg  at night time. They are planning to install cameras hopefully for his peace of mind.   Continues current meds otherwise.   Saw podiatry for flat foot and onychomycosis. Considering custom orthotics and toenail removal.      Relevant past medical, surgical, family and social history reviewed and updated as indicated. Interim medical history since our last visit reviewed. Allergies and medications reviewed and updated. Outpatient Medications Prior to Visit  Medication Sig Dispense Refill  . clotrimazole (LOTRIMIN AF) 1 % cream Apply 1 application topically 2 (two) times daily.    . diclofenac Sodium (VOLTAREN) 1 % GEL Apply 2 g topically 3 (three) times daily. 100 g 1  . gabapentin (NEURONTIN) 100 MG capsule Take 200 mg by mouth every morning and 300 mg by mouth every evening. 150 capsule 11  . Ibuprofen 200 MG CAPS Take 400-600mg  twice daily with meals as needed for foot pain    . Multiple Vitamin (MULTIVITAMIN) tablet Take 1 tablet by mouth daily.    PHENobarbital (LUMINAL) 64.8  MG tablet TAKE 4 TABLETS BY MOUTH DAILY AT BEDTIME. (ROUND WHITE TABLET WITH e5 757) 124 tablet 3  . QUEtiapine (SEROQUEL) 300 MG tablet Take 2 tablets (600 mg total) by mouth at bedtime. 60 tablet 11  . vitamin B-12 (CYANOCOBALAMIN) 500 MCG tablet Take 500 mcg by mouth daily with breakfast.     . pravastatin (PRAVACHOL) 10 MG tablet TAKE ONE TABLET BY MOUTH DAILY (PINK ROUND TABLET WITH APO PRA 10) 31 tablet 3  . Undecylenic Acid (FUNGI-NAIL) 25 % SOLN Apply nail laquer to toenails daily 480 mL 0   No facility-administered medications prior to visit.     Per HPI unless specifically indicated in ROS section below Review of Systems Objective:  BP 112/66 (BP Location: Left Arm, Patient Position: Sitting, Cuff Size: Normal)   Pulse 70   Temp 97.7 F (36.5 C) (Temporal)   Ht 5' 3.5" (1.613 m)   Wt 163 lb 9 oz (74.2 kg)   SpO2 96%   BMI 28.52 kg/m   Wt Readings from Last 3 Encounters:  01/29/20 163 lb 9 oz (74.2 kg)  07/28/19 160 lb 7 oz (72.8 kg)  07/21/19 160 lb (72.6 kg)      Physical Exam Vitals and nursing note reviewed.  Constitutional:      Appearance: Normal appearance. He is not ill-appearing.  HENT:     Right Ear:  Tympanic membrane, ear canal and external ear normal.     Left Ear: Tympanic membrane, ear canal and external ear normal.  Eyes:     Extraocular Movements: Extraocular movements intact.     Pupils: Pupils are equal, round, and reactive to light.  Neck:     Thyroid: Thyroid mass (R thyroid nodule) present. No thyromegaly or thyroid tenderness.  Cardiovascular:     Rate and Rhythm: Normal rate and regular rhythm.     Pulses: Normal pulses.     Heart sounds: Normal heart sounds. No murmur heard.   Pulmonary:     Effort: Pulmonary effort is normal. No respiratory distress.     Breath sounds: Normal breath sounds. No wheezing, rhonchi or rales.  Musculoskeletal:     Cervical back: Normal range of motion and neck supple.     Right lower leg: No edema.      Left lower leg: No edema.  Neurological:     Mental Status: He is alert.  Psychiatric:        Mood and Affect: Mood normal.        Behavior: Behavior normal.       Results for orders placed or performed in visit on 07/28/19  Lipid panel  Result Value Ref Range   Cholesterol 195 0 - 200 mg/dL   Triglycerides 34.3 0 - 149 mg/dL   HDL 56.86 >16.83 mg/dL   VLDL 72.9 0.0 - 02.1 mg/dL   LDL Cholesterol 115 (H) 0 - 99 mg/dL   Total CHOL/HDL Ratio 3    NonHDL 135.54   Comprehensive metabolic panel  Result Value Ref Range   Sodium 139 135 - 145 mEq/L   Potassium 4.1 3.5 - 5.1 mEq/L   Chloride 101 96 - 112 mEq/L   CO2 30 19 - 32 mEq/L   Glucose, Bld 94 70 - 99 mg/dL   BUN 18 6 - 23 mg/dL   Creatinine, Ser 5.20 0.40 - 1.50 mg/dL   Total Bilirubin 0.3 0.2 - 1.2 mg/dL   Alkaline Phosphatase 122 (H) 39 - 117 U/L   AST 13 0 - 37 U/L   ALT 8 0 - 53 U/L   Total Protein 7.5 6.0 - 8.3 g/dL   Albumin 4.0 3.5 - 5.2 g/dL   GFR 80.22 (L) >33.61 mL/min   Calcium 9.3 8.4 - 10.5 mg/dL  CBC with Differential  Result Value Ref Range   WBC 5.0 4.0 - 10.5 K/uL   RBC 3.77 (L) 4.22 - 5.81 Mil/uL   Hemoglobin 12.9 (L) 13.0 - 17.0 g/dL   HCT 22.4 (L) 39 - 52 %   MCV 102.6 (H) 78.0 - 100.0 fl   MCHC 33.4 30.0 - 36.0 g/dL   RDW 49.7 53.0 - 05.1 %   Platelets 234.0 150 - 400 K/uL   Neutrophils Relative % 61.2 43 - 77 %   Lymphocytes Relative 26.2 12 - 46 %   Monocytes Relative 10.0 3 - 12 %   Eosinophils Relative 1.3 0 - 5 %   Basophils Relative 1.3 0 - 3 %   Neutro Abs 3.0 1.4 - 7.7 K/uL   Lymphs Abs 1.3 0.7 - 4.0 K/uL   Monocytes Absolute 0.5 0 - 1 K/uL   Eosinophils Absolute 0.1 0 - 0 K/uL   Basophils Absolute 0.1 0 - 0 K/uL  Vitamin B12  Result Value Ref Range   Vitamin B-12 743 211 - 911 pg/mL  Phenobarbital level  Result Value Ref Range  Phenobarbital, Serum 38.2 15.0 - 40.0 mg/L  TSH  Result Value Ref Range   TSH 1.97 0.35 - 4.50 uIU/mL   Assessment & Plan:  This visit occurred  during the SARS-CoV-2 public health emergency.  Safety protocols were in place, including screening questions prior to the visit, additional usage of staff PPE, and extensive cleaning of exam room while observing appropriate contact time as indicated for disinfecting solutions.   Problem List Items Addressed This Visit    Seizure disorder (HCC) - Primary    Stable period without recent seizure on phenobarbital and gabapentin.       Pes planus    Planned podiatry f/u for custom orthotics.      HLD (hyperlipidemia)    Pravastatin refilled.       Relevant Medications   pravastatin (PRAVACHOL) 10 MG tablet   Down syndrome    Forms for adult daycare filled out today.       Agitation    Increasing agitation at night time affecting ability to fall asleep - this is despite high dose seroquel. Will trial melatonin 5-10mg  at night time. They are also planning on installing camera system at home for extra security        Other Visit Diagnoses    Right thyroid nodule       Relevant Orders   US THYROID       Meds ordered this encounter  Medications  . pravastatin (PRAVACHOL) 10 MG tablet    Sig: Take 1 tablet (10 mg total) by mouth daily.    Dispense:  31 tablet    Refill:  11   Orders Placed This Encounter  Procedures  . US THYROID    165 / mcr mcd / no needs / fh w pt sis  / no stims body injectors gluciose monitors / no to covid q's   ORDER CHECKED 01/29/20/[facility]    Standing Status:   Future    Standing Expiration Date:   01/28/2021    Order Specific Question:   Reason for Exam (SYMPTOM  OR DIAGNOSIS REQUIRED)    Answer:   ?r thyroid nodule    Order Specific Question:   Preferred imaging location?    Answer:   [facility]    Patient Instructions  Ok to add melatonin 5-10mg  at night time for sleep.  Continue other medicines For possible right thyroid nodule - will order ultrasound and be in touch with appointment Return in 6 months for physical.    Follow up  plan: Return in about 6 months (around 07/31/2020), or if symptoms worsen or fail to improve, for annual exam, prior fasting for blood work, medicare wellness visit.  Eustaquio Boyden, MD

## 2020-01-29 NOTE — Assessment & Plan Note (Signed)
Pravastatin refilled.  

## 2020-01-29 NOTE — Assessment & Plan Note (Signed)
Planned podiatry f/u for custom orthotics.

## 2020-01-29 NOTE — Assessment & Plan Note (Signed)
Increasing agitation at night time affecting ability to fall asleep - this is despite high dose seroquel. Will trial melatonin 5-10mg  at night time. They are also planning on installing camera system at home for extra security

## 2020-01-29 NOTE — Assessment & Plan Note (Signed)
Forms for adult daycare filled out today.

## 2020-01-29 NOTE — Assessment & Plan Note (Signed)
Stable period without recent seizure on phenobarbital and gabapentin.

## 2020-01-29 NOTE — Patient Instructions (Addendum)
Ok to add melatonin 5-10mg  at night time for sleep.  Continue other medicines For possible right thyroid nodule - will order ultrasound and be in touch with appointment Return in 6 months for physical.

## 2020-02-11 ENCOUNTER — Ambulatory Visit
Admission: RE | Admit: 2020-02-11 | Discharge: 2020-02-11 | Disposition: A | Discharge status: Home / Self Care | Payer: 59 | Source: Ambulatory Visit | Attending: Family Medicine | Admitting: Family Medicine

## 2020-02-11 DIAGNOSIS — E041 Nontoxic single thyroid nodule: Secondary | ICD-10-CM

## 2020-03-01 ENCOUNTER — Other Ambulatory Visit: Payer: Self-pay | Admitting: Family Medicine

## 2020-03-01 NOTE — Telephone Encounter (Signed)
Electronic refill request. Seroquel Last office visit:   01/29/2020 Last Filled:    60 tablet 11 03/13/2019  Please advise.

## 2020-04-01 ENCOUNTER — Other Ambulatory Visit: Payer: Self-pay | Admitting: Neurology

## 2020-04-03 ENCOUNTER — Other Ambulatory Visit: Payer: Self-pay | Admitting: Neurology

## 2020-04-30 DIAGNOSIS — U071 COVID-19: Secondary | ICD-10-CM

## 2020-04-30 HISTORY — DX: COVID-19: U07.1

## 2020-05-09 ENCOUNTER — Other Ambulatory Visit: Payer: Self-pay

## 2020-05-09 ENCOUNTER — Encounter (HOSPITAL_COMMUNITY): Payer: Self-pay | Admitting: *Deleted

## 2020-05-09 ENCOUNTER — Emergency Department (HOSPITAL_COMMUNITY): Payer: 59

## 2020-05-09 ENCOUNTER — Ambulatory Visit (HOSPITAL_COMMUNITY)
Admission: EM | Admit: 2020-05-09 | Discharge: 2020-05-09 | Disposition: A | Discharge status: Nursing Home (Includes ICF) | Payer: 59 | Attending: Emergency Medicine | Admitting: Emergency Medicine

## 2020-05-09 ENCOUNTER — Inpatient Hospital Stay (HOSPITAL_COMMUNITY)
Admission: EM | Admit: 2020-05-09 | Discharge: 2020-05-14 | DRG: 177 | Disposition: A | Discharge status: Home Health | Payer: 59 | Attending: Internal Medicine | Admitting: Internal Medicine

## 2020-05-09 DIAGNOSIS — J9601 Acute respiratory failure with hypoxia: Secondary | ICD-10-CM | POA: Diagnosis present

## 2020-05-09 DIAGNOSIS — Z8249 Family history of ischemic heart disease and other diseases of the circulatory system: Secondary | ICD-10-CM | POA: Diagnosis not present

## 2020-05-09 DIAGNOSIS — D703 Neutropenia due to infection: Secondary | ICD-10-CM | POA: Diagnosis not present

## 2020-05-09 DIAGNOSIS — R339 Retention of urine, unspecified: Secondary | ICD-10-CM | POA: Diagnosis present

## 2020-05-09 DIAGNOSIS — Z7185 Encounter for immunization safety counseling: Secondary | ICD-10-CM

## 2020-05-09 DIAGNOSIS — H269 Unspecified cataract: Secondary | ICD-10-CM | POA: Diagnosis present

## 2020-05-09 DIAGNOSIS — Z880 Allergy status to penicillin: Secondary | ICD-10-CM | POA: Diagnosis not present

## 2020-05-09 DIAGNOSIS — R531 Weakness: Secondary | ICD-10-CM | POA: Diagnosis not present

## 2020-05-09 DIAGNOSIS — I959 Hypotension, unspecified: Secondary | ICD-10-CM | POA: Diagnosis present

## 2020-05-09 DIAGNOSIS — J1282 Pneumonia due to coronavirus disease 2019: Secondary | ICD-10-CM

## 2020-05-09 DIAGNOSIS — U071 COVID-19: Secondary | ICD-10-CM | POA: Diagnosis present

## 2020-05-09 DIAGNOSIS — E785 Hyperlipidemia, unspecified: Secondary | ICD-10-CM | POA: Diagnosis present

## 2020-05-09 DIAGNOSIS — Z823 Family history of stroke: Secondary | ICD-10-CM

## 2020-05-09 DIAGNOSIS — R7989 Other specified abnormal findings of blood chemistry: Secondary | ICD-10-CM | POA: Diagnosis present

## 2020-05-09 DIAGNOSIS — Q909 Down syndrome, unspecified: Secondary | ICD-10-CM

## 2020-05-09 DIAGNOSIS — R0902 Hypoxemia: Secondary | ICD-10-CM

## 2020-05-09 DIAGNOSIS — Z833 Family history of diabetes mellitus: Secondary | ICD-10-CM | POA: Diagnosis not present

## 2020-05-09 DIAGNOSIS — R410 Disorientation, unspecified: Secondary | ICD-10-CM | POA: Diagnosis present

## 2020-05-09 DIAGNOSIS — I2699 Other pulmonary embolism without acute cor pulmonale: Secondary | ICD-10-CM | POA: Diagnosis present

## 2020-05-09 DIAGNOSIS — D709 Neutropenia, unspecified: Secondary | ICD-10-CM | POA: Diagnosis present

## 2020-05-09 DIAGNOSIS — R5383 Other fatigue: Secondary | ICD-10-CM | POA: Diagnosis not present

## 2020-05-09 DIAGNOSIS — Q249 Congenital malformation of heart, unspecified: Secondary | ICD-10-CM | POA: Diagnosis not present

## 2020-05-09 DIAGNOSIS — Z79899 Other long term (current) drug therapy: Secondary | ICD-10-CM

## 2020-05-09 DIAGNOSIS — Z23 Encounter for immunization: Secondary | ICD-10-CM

## 2020-05-09 DIAGNOSIS — Z800 Family history of malignant neoplasm of digestive organs: Secondary | ICD-10-CM | POA: Diagnosis not present

## 2020-05-09 DIAGNOSIS — I82452 Acute embolism and thrombosis of left peroneal vein: Secondary | ICD-10-CM | POA: Diagnosis present

## 2020-05-09 DIAGNOSIS — Z283 Underimmunization status: Secondary | ICD-10-CM

## 2020-05-09 DIAGNOSIS — G40909 Epilepsy, unspecified, not intractable, without status epilepticus: Secondary | ICD-10-CM | POA: Diagnosis present

## 2020-05-09 LAB — COMPREHENSIVE METABOLIC PANEL
ALT: 37 U/L (ref 0–44)
AST: 62 U/L — ABNORMAL HIGH (ref 15–41)
Albumin: 2.6 g/dL — ABNORMAL LOW (ref 3.5–5.0)
Alkaline Phosphatase: 95 U/L (ref 38–126)
Anion gap: 8 (ref 5–15)
BUN: 10 mg/dL (ref 6–20)
CO2: 28 mmol/L (ref 22–32)
Calcium: 8 mg/dL — ABNORMAL LOW (ref 8.9–10.3)
Chloride: 103 mmol/L (ref 98–111)
Creatinine, Ser: 1.13 mg/dL (ref 0.61–1.24)
GFR, Estimated: >60 mL/min (ref >60)
Glucose, Bld: 85 mg/dL (ref 70–99)
Potassium: 5 mmol/L (ref 3.5–5.1)
Sodium: 139 mmol/L (ref 135–145)
Total Bilirubin: 1.1 mg/dL (ref 0.3–1.2)
Total Protein: 6.1 g/dL — ABNORMAL LOW (ref 6.5–8.1)

## 2020-05-09 LAB — CBC WITH DIFFERENTIAL/PLATELET
Abs Immature Granulocytes: 0 10*3/uL (ref 0.00–0.07)
Basophils Absolute: 0 10*3/uL (ref 0.0–0.1)
Basophils Relative: 1 %
Eosinophils Absolute: 0 10*3/uL (ref 0.0–0.5)
Eosinophils Relative: 1 %
HCT: 39.2 % (ref 39.0–52.0)
Hemoglobin: 12.7 g/dL — ABNORMAL LOW (ref 13.0–17.0)
Lymphocytes Relative: 33 %
Lymphs Abs: 1 10*3/uL (ref 0.7–4.0)
MCH: 33.2 pg (ref 26.0–34.0)
MCHC: 32.4 g/dL (ref 30.0–36.0)
MCV: 102.6 fL — ABNORMAL HIGH (ref 80.0–100.0)
Monocytes Absolute: 0.5 10*3/uL (ref 0.1–1.0)
Monocytes Relative: 18 %
Neutro Abs: 1.4 10*3/uL — ABNORMAL LOW (ref 1.7–7.7)
Neutrophils Relative %: 47 %
Platelets: 170 10*3/uL (ref 150–400)
RBC: 3.82 MIL/uL — ABNORMAL LOW (ref 4.22–5.81)
RDW: 15.7 % — ABNORMAL HIGH (ref 11.5–15.5)
WBC Morphology: >10
WBC: 2.9 10*3/uL — ABNORMAL LOW (ref 4.0–10.5)
nRBC: 0 % (ref 0.0–0.2)

## 2020-05-09 LAB — LACTIC ACID, PLASMA: Lactic Acid, Venous: 1.5 mmol/L (ref 0.5–1.9)

## 2020-05-09 LAB — RESPIRATORY PANEL BY RT PCR (FLU A&B, COVID)
Influenza A by PCR: NEGATIVE
Influenza B by PCR: NEGATIVE
SARS Coronavirus 2 by RT PCR: POSITIVE — AB

## 2020-05-09 LAB — TROPONIN I (HIGH SENSITIVITY): Troponin I (High Sensitivity): 9 ng/L (ref <18)

## 2020-05-09 LAB — CBG MONITORING, ED: Glucose-Capillary: 109 mg/dL — ABNORMAL HIGH (ref 70–99)

## 2020-05-09 MED ORDER — VITAMIN B-12 1000 MCG PO TABS
500.0000 ug | ORAL_TABLET | Freq: Every day | ORAL | Status: DC
  Administered 2020-05-10 – 2020-05-14 (×5): 500 ug via ORAL
  Filled 2020-05-09 (×6): qty 1

## 2020-05-09 MED ORDER — PRAVASTATIN SODIUM 10 MG PO TABS
10.0000 mg | ORAL_TABLET | Freq: Every day | ORAL | Status: DC
  Administered 2020-05-10 – 2020-05-14 (×5): 10 mg via ORAL
  Filled 2020-05-09 (×5): qty 1

## 2020-05-09 MED ORDER — SODIUM CHLORIDE 0.9 % IV SOLN
500.0000 mg | INTRAVENOUS | Status: DC
  Administered 2020-05-10: 500 mg via INTRAVENOUS
  Filled 2020-05-09: qty 500

## 2020-05-09 MED ORDER — SODIUM CHLORIDE 0.9 % IV SOLN: 200.0000 mg | Freq: Once | INTRAVENOUS | Status: DC

## 2020-05-09 MED ORDER — DEXAMETHASONE SODIUM PHOSPHATE 10 MG/ML IJ SOLN
6.0000 mg | Freq: Once | INTRAMUSCULAR | Status: AC
  Administered 2020-05-09: 6 mg via INTRAVENOUS
  Filled 2020-05-09: qty 1

## 2020-05-09 MED ORDER — ADULT MULTIVITAMIN W/MINERALS CH
1.0000 | ORAL_TABLET | Freq: Every day | ORAL | Status: DC
  Administered 2020-05-10 – 2020-05-14 (×5): 1 via ORAL
  Filled 2020-05-09 (×5): qty 1

## 2020-05-09 MED ORDER — PHENOBARBITAL 32.4 MG PO TABS: 64.8000 mg | ORAL_TABLET | Freq: Every day | ORAL | Status: DC

## 2020-05-09 MED ORDER — DEXAMETHASONE 6 MG PO TABS
6.0000 mg | ORAL_TABLET | ORAL | Status: DC
  Administered 2020-05-10: 6 mg via ORAL
  Filled 2020-05-09: qty 1

## 2020-05-09 MED ORDER — SODIUM CHLORIDE 0.9 % IV SOLN
100.0000 mg | Freq: Every day | INTRAVENOUS | Status: AC
  Administered 2020-05-10: 100 mg via INTRAVENOUS
  Filled 2020-05-09: qty 20

## 2020-05-09 MED ORDER — SODIUM CHLORIDE 0.9 % IV SOLN
100.0000 mg | Freq: Every day | INTRAVENOUS | Status: AC
  Administered 2020-05-11 – 2020-05-13 (×3): 100 mg via INTRAVENOUS
  Filled 2020-05-09 (×3): qty 20

## 2020-05-09 MED ORDER — QUETIAPINE FUMARATE 300 MG PO TABS
300.0000 mg | ORAL_TABLET | Freq: Every day | ORAL | Status: DC
  Administered 2020-05-09 – 2020-05-13 (×5): 300 mg via ORAL
  Filled 2020-05-09: qty 3
  Filled 2020-05-09 (×2): qty 1
  Filled 2020-05-09: qty 3
  Filled 2020-05-09: qty 1
  Filled 2020-05-09: qty 3
  Filled 2020-05-09: qty 1
  Filled 2020-05-09: qty 3
  Filled 2020-05-09 (×2): qty 1

## 2020-05-09 MED ORDER — ENOXAPARIN SODIUM 40 MG/0.4ML ~~LOC~~ SOLN
40.0000 mg | SUBCUTANEOUS | Status: DC
  Administered 2020-05-09: 40 mg via SUBCUTANEOUS
  Filled 2020-05-09: qty 0.4

## 2020-05-09 MED ORDER — SODIUM CHLORIDE 0.9 % IV SOLN
200.0000 mg | Freq: Once | INTRAVENOUS | Status: AC
  Administered 2020-05-09: 200 mg via INTRAVENOUS
  Filled 2020-05-09: qty 40

## 2020-05-09 MED ORDER — PHENOBARBITAL 32.4 MG PO TABS
259.2000 mg | ORAL_TABLET | Freq: Every day | ORAL | Status: DC
  Administered 2020-05-09 – 2020-05-13 (×5): 259.2 mg via ORAL
  Filled 2020-05-09 (×5): qty 8

## 2020-05-09 MED ORDER — SODIUM CHLORIDE 0.9 % IV SOLN: 100.0000 mg | Freq: Every day | INTRAVENOUS | Status: DC

## 2020-05-09 MED ORDER — SODIUM CHLORIDE 0.9 % IV SOLN: INTRAVENOUS | Status: DC | PRN

## 2020-05-09 MED ORDER — SODIUM CHLORIDE 0.9 % IV SOLN
1.0000 g | INTRAVENOUS | Status: DC
  Administered 2020-05-09: 1 g via INTRAVENOUS
  Filled 2020-05-09 (×2): qty 10

## 2020-05-09 MED ORDER — SODIUM CHLORIDE 0.9 % IV SOLN: Freq: Once | INTRAVENOUS | Status: AC

## 2020-05-09 NOTE — ED Notes (Signed)
Patient is being discharged from the Urgent Care and sent to the Emergency Department via EMS Per EMS, patient is in need of higher level of care due to hypoxia, hypotension Patient is aware and verbalizes understanding of plan of care.  Vitals:   05/09/20 1530 05/09/20 1550  BP:  106/68  Pulse:    Resp:    Temp:    SpO2: 92%

## 2020-05-09 NOTE — ED Provider Notes (Signed)
[facility]    CSN: [id number] Arrival date & time: 05/09/20  1357      History   Chief Complaint Chief Complaint  Patient presents with  . Fatigue    HPI Kenneth [name] is a 49 y.o. male.   Patient is nonverbal. Caregiver states patient is not feeling well.  Vomiting small amounts of sputum while here in the exam room.  His caregiver reports weakness which has gotten worse.  He is not vaccinated for COVID.  She denies fever.  His medical history includes Down syndrome, seizures, congenital heart defect, acute pulmonary edema, osteoarthritis, avascular necrosis, cataracts, agitation, hyperlipidemia.  The history is provided by a caregiver.    Past Medical History:  Diagnosis Date  . Cataracts, bilateral   . Congenital heart defect   . Down syndrome   . HLD (hyperlipidemia)   . Seizure disorder [place] [facility])     Patient Active Problem List   Diagnosis Date Noted  . Agitation 01/29/2020  . Bilateral cataracts 07/28/2019  . Left foot pain 07/09/2019  . Pes planus 07/09/2019  . Tinea pedis 07/09/2019  . 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 Localization-related symptomatic epilepsy and epileptic syndromes with complex partial seizures, not intractable, without status epilepticus (HCC) 12/22/2016  . Hearing trouble, bilateral 11/16/2016  . 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 DDD (degenerative disc disease), lumbar 08/08/2016  . Constipation 08/08/2016  . Acute pulmonary edema (HCC) 12/15/2015  . Advanced care planning/counseling discussion 07/13/2014  . Medicare annual wellness visit, subsequent 07/11/2013  . Down syndrome   . Seizure disorder (HCC)   . Vitamin B12 deficiency 04/22/2010  . HLD (hyperlipidemia) 02/07/2007    History reviewed. No pertinent surgical history.     Home Medications    Prior to Admission medications   Medication Sig Start Date End Date Taking? Authorizing Provider    clotrimazole (LOTRIMIN AF) 1 % cream Apply 1 application topically 2 (two) times daily. 07/09/19  Yes Eustaquio Boyden, MD  diclofenac Sodium (VOLTAREN) 1 % GEL Apply 2 g topically 3 (three) times daily. 07/09/19  Yes Eustaquio Boyden, MD  gabapentin (NEURONTIN) 100 MG capsule TAKE 2 CAPSULES BY MOUTH IN THE MORNING AND 3 CAPSULES IN THE EVENING. (WHITE CAPSULE WITH D 02 OR SG 179) 04/04/20  Yes Van Clines, MD  Ibuprofen 200 MG CAPS Take 400-600mg  twice daily with meals as needed for foot pain 07/09/19  Yes Eustaquio Boyden, MD  Multiple Vitamin (MULTIVITAMIN) tablet Take 1 tablet by mouth daily.   Yes [provider]  PHENobarbital (LUMINAL) 64.8 MG tablet TAKE FOUR (4) TABLETS BY MOUTH DAILY AT BEDTIME. (ROUND WHITE TABLET WITH E5 757) 04/02/20  Yes Van Clines, MD  pravastatin (PRAVACHOL) 10 MG tablet Take 1 tablet (10 mg total) by mouth daily. 01/29/20  Yes Eustaquio Boyden, MD  QUEtiapine (SEROQUEL) 300 MG tablet TAKE 2 TABLETS BY MOUTH EVERY NIGHT AT BEDTIME (WHITE OBLONG TABLET WITH 259) 03/01/20  Yes Eustaquio Boyden, MD  vitamin B-12 (CYANOCOBALAMIN) 500 MCG tablet Take 500 mcg by mouth daily with breakfast.    Yes [provider]    Family History Family History  Problem Relation Age of Onset  . Diabetes Mother   . Hypertension Mother        Cerebral hemorrhage  . Diabetes Father   . Hypertension Father   . Coronary artery disease Father 1       MI  . Stroke Father   .  Cancer Maternal Grandmother        Oral  . Cancer Maternal Aunt        Colon    Social History Social History   Tobacco Use  . Smoking status: Never Smoker  . Smokeless tobacco: Never Used  Vaping Use  . Vaping Use: Never used  Substance Use Topics  . Alcohol use: No  . Drug use: No     Allergies   Amoxicillin-pot clavulanate and Penicillins   Review of Systems Review of Systems  Constitutional: Positive for fatigue. Negative for fever.  HENT: Negative for congestion  and rhinorrhea.   Respiratory: Negative for cough and shortness of breath.   Gastrointestinal: Positive for vomiting.  Genitourinary: Negative for hematuria.  Skin: Negative for color change and rash.  Neurological: Positive for weakness. Negative for seizures.  All other systems reviewed and are negative.    Physical Exam Triage Vital Signs ED Triage Vitals  Enc Vitals Group     BP      Pulse      Resp      Temp      Temp src      SpO2      Weight      Height      Head Circumference      Peak Flow      Pain Score      Pain Loc      Pain Edu?      Excl. in GC?    No data found.  Updated Vital Signs BP 106/68 (BP Location: Left Arm)   Pulse 83   Temp 99.2 F (37.3 C) (Oral)   Resp 20   Ht 5\' 4"  (1.626 m)   Wt 165 lb (74.8 kg)   SpO2 92%   BMI 28.32 kg/m   Visual Acuity Right Eye Distance:   Left Eye Distance:   Bilateral Distance:    Right Eye Near:   Left Eye Near:    Bilateral Near:     Physical Exam Vitals and nursing note reviewed.  Constitutional:      General: He is in acute distress.     Appearance: He is well-developed. He is ill-appearing.  HENT:     Head: Normocephalic and atraumatic.     Mouth/Throat:     Mouth: Mucous membranes are moist.  Eyes:     Conjunctiva/sclera: Conjunctivae normal.  Cardiovascular:     Rate and Rhythm: Normal rate and regular rhythm.     Heart sounds: Normal heart sounds.  Pulmonary:     Effort: Pulmonary effort is normal. No respiratory distress.     Breath sounds: Normal breath sounds. No wheezing or rhonchi.     Comments: Tachypnic Abdominal:     General: There is distension.     Palpations: Abdomen is soft.     Tenderness: There is no abdominal tenderness.  Musculoskeletal:     Cervical back: Neck supple.     Right lower leg: No edema.     Left lower leg: No edema.  Skin:    General: Skin is warm and dry.  Neurological:     Mental Status: He is alert.     Gait: Gait abnormal.      UC  Treatments / Results  Labs (all labs ordered are listed, but only abnormal results are displayed) Labs Reviewed  CBG MONITORING, ED - Abnormal; Notable for the following components:      Result Value   Glucose-Capillary 109 (*)  All other components within normal limits  CBG MONITORING, ED    EKG   Radiology No results found.  Procedures Procedures (including critical care time)  Medications Ordered in UC Medications  0.9 %  sodium chloride infusion ( Intravenous New Bag/Given 05/09/20 1548)    Initial Impression / Assessment and Plan / UC Course  I have reviewed the triage vital signs and the nursing notes.  Pertinent labs & imaging results that were available during my care of the patient were reviewed by me and considered in my medical decision making (see chart for details).   Hypoxic, sats ranging 84%-92% on RA, 92-95% on 2L O2 via [place].  Hypotensive.  Systolic 89.  Sending patient to the ED via EMS.     Final Clinical Impressions(s) / UC Diagnoses   Final diagnoses:  Hypoxia  Hypotension, unspecified hypotension type   Discharge Instructions   None    ED Prescriptions    None     PDMP not reviewed this encounter.   Mickie Bail, NP 05/09/20 1555

## 2020-05-09 NOTE — ED Triage Notes (Signed)
Pt is lethargic, sent from [facility] via EMS-- on arrival pt is pale, is nonverbal due to MR- sister at bedside to assist with hx.

## 2020-05-09 NOTE — H&P (Signed)
History and Physical   Kenneth [name] XLK:[id number] DOB: 07-Mar-1971 DOA: 05/09/2020  PCP: Eustaquio Boyden, MD  Patient coming from: home  I have personally briefly reviewed patient's old medical records in [facility] EMR.  Chief Concern: abdominal discomfort and generalized fatigue  HPI: Kenneth [name] is a 49 y.o. male with medical history significant for abdominal discomfort and generalized fatigue.   Caregiver denies cough, fever, chills, vomtiing, diarrhea, shortness of breath. She endorsed he has been complaining of nausea, but no vomiting. Decreased appeitte on Friday, but normal healthy appeite on Saturday.   Minimal urine production today and yesterday. Sister reports patient does not require supplemental home oxygen.  She reports patient is not vaccinated for covid and states patient's pcp will arrange for outpatient vaccine.   ED Course: discussed with ED provider. Hypoxic in ED requiring O2 [place]. Admit for covid pna.   Review of Systems: not obtained from patient. 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 Cataracts, bilateral   . Congenital heart defect   . Down syndrome   . HLD (hyperlipidemia)   . Seizure disorder (HCC)    No past surgical history on file.  Social History:  reports that he has never smoked. He has never used smokeless tobacco. He reports that he does not drink alcohol and does not use drugs.  Allergies  Allergen Reactions  . Amoxicillin-Pot Clavulanate Palpitations and Other (See Comments)    "Felt like I was having a heart attack." Has patient had a PCN reaction causing immediate rash, facial/tongue/throat swelling, SOB or lightheadedness with hypotension: No Has patient had a PCN reaction causing severe rash involving mucus membranes or skin necrosis: No Has patient had a PCN reaction that required hospitalization: No Has patient had a PCN reaction occurring within  the last 10 years: No If all of the above answers are "NO", then [place] proceed with Cephalosporin use.  unknown  . Penicillins Itching, Rash and Palpitations    Has patient had a PCN reaction causing immediate rash, facial/tongue/throat swelling, SOB or lightheadedness with hypotension: No Has patient had a PCN reaction causing severe rash involving mucus membranes or skin necrosis: No Has patient had a PCN reaction that required hospitalization: Unknown Has patient had a PCN reaction occurring within the last 10 years: No If all of the above answers are "NO", then K[place]ler proceed with Cephalosporin use.  "Felt like I was having a heart attack." Has patient had a PCN reaction causing immediate rash, facial/tongue/throat swelling, SOB or lightheadedness with hypotension: No Has patient had a PCN reaction causing severe rash involving mucus membranes or skin necrosis: No Has patient had a PCN reaction that required hospitalization: No Has patient had a PCN reaction occurring within the last 10 years: No If all of the above answers are "NO", then St[place] proceed with Cephalosporin use. Has patient had a PCN reaction causing immediate rash, facial/tongue/throat swelling, SOB or lightheadedness with hypotension: No Has patient had a PCN reaction causing severe rash involving mucus membranes or skin necrosis: No Has patient had a PCN reaction that required hospitalization: Unknown Has patient had a PCN reaction occurring within the last 10 years: No If all of the above answers are "NO", then [place]iff[place] proceed with Cephalosporin use.   Family History  Problem Relation Age of Onset  . Diabetes Mother   . Hypertension Mother        Cerebral hemorrhage  . Diabetes  Father   . Hypertension Father   . Coronary artery disease Father 32       MI  . Stroke Father   . Cancer Maternal Grandmother        Oral  . Cancer Maternal Aunt        Colon   Family history: Family history reviewed and not pertinent  Prior  to Admission medications   Medication Sig Start Date End Date Taking? Authorizing Provider  clotrimazole (LOTRIMIN AF) 1 % cream Apply 1 application topically 2 (two) times daily. 07/09/19   Eustaquio Boyden, MD  diclofenac Sodium (VOLTAREN) 1 % GEL Apply 2 g topically 3 (three) times daily. 07/09/19   Eustaquio Boyden, MD  gabapentin (NEURONTIN) 100 MG capsule TAKE 2 CAPSULES BY MOUTH IN THE MORNING AND 3 CAPSULES IN THE EVENING. (WHITE CAPSULE WITH D 02 OR SG 179) 04/04/20   Van Clines, MD  Ibuprofen 200 MG CAPS Take 400-600mg  twice daily with meals as needed for foot pain 07/09/19   Eustaquio Boyden, MD  Multiple Vitamin (MULTIVITAMIN) tablet Take 1 tablet by mouth daily.    [provider]  PHENobarbital (LUMINAL) 64.8 MG tablet TAKE FOUR (4) TABLETS BY MOUTH DAILY AT BEDTIME. (ROUND WHITE TABLET WITH E5 757) 04/02/20   Van Clines, MD  pravastatin (PRAVACHOL) 10 MG tablet Take 1 tablet (10 mg total) by mouth daily. 01/29/20   Eustaquio Boyden, MD  QUEtiapine (SEROQUEL) 300 MG tablet TAKE 2 TABLETS BY MOUTH EVERY NIGHT AT BEDTIME (WHITE OBLONG TABLET WITH 259) 03/01/20   Eustaquio Boyden, MD  vitamin B-12 (CYANOCOBALAMIN) 500 MCG tablet Take 500 mcg by mouth daily with breakfast.     [provider]   Physical Exam: Vitals:   05/09/20 2000 05/09/20 2015 05/09/20 2030 05/09/20 2045  BP: 135/88 133/89 (!) 135/97 134/90  Pulse: 71 71 70 71  Resp: 19 20 (!) 22 (!) 23  Temp:      TempSrc:      SpO2: 95% 99%  97%   Constitutional: NAD, calm, comfortable Eyes: PERRL, lids and conjunctivae normal ENMT: Mucous membranes are moist. Posterior pharynx clear of any exudate or lesions.Normal dentition.  Neck: normal, supple, no masses, no thyromegaly Respiratory: clear to auscultation bilaterally, no wheezing, no crackles. Normal respiratory effort. No accessory muscle use.  Cardiovascular: Regular rate and rhythm, no murmurs / rubs / gallops. No extremity edema. 2+ pedal  pulses. No carotid bruits.  Abdomen: no tenderness, no masses palpated. No hepatosplenomegaly. Bowel sounds positive.  Musculoskeletal: no clubbing / cyanosis. No joint deformity upper and lower extremities. Good ROM, no contractures. Normal muscle tone.  Skin: no rashes, lesions, ulcers. No induration Neurologic: CN 2-12 grossly intact. Sensation intact, DTR normal. Strength 5/5 in all 4.  Psychiatric: Normal judgment and insight. Alert and oriented x 3. Normal mood.   Labs on Admission: I have personally reviewed following labs and imaging studies  CBC: Recent Labs  Lab 05/09/20 1651  WBC 2.9*  NEUTROABS 1.4*  HGB 12.7*  HCT 39.2  MCV 102.6*  PLT 170   Basic Metabolic Panel: Recent Labs  Lab 05/09/20 1651  NA 139  K 5.0  CL 103  CO2 28  GLUCOSE 85  BUN 10  CREATININE 1.13  CALCIUM 8.0*   GFR: Estimated Creatinine Clearance: 74 mL/min (by C-G formula based on SCr of 1.13 mg/dL).  Liver Function Tests: Recent Labs  Lab 05/09/20 1651  AST 62*  ALT 37  ALKPHOS 95  BILITOT 1.1  PROT 6.1*  ALBUMIN 2.6*   CBG: Recent Labs  Lab 05/09/20 1542  GLUCAP 109*   Lipid Profile: No results for input(s): CHOL, HDL, LDLCALC, TRIG, CHOLHDL, LDLDIRECT in the last 72 hours. Thyroid Function Tests: No results for input(s): TSH, T4TOTAL, FREET4, T3FREE, THYROIDAB in the last 72 hours. Anemia Panel: No results for input(s): VITAMINB12, FOLATE, FERRITIN, TIBC, IRON, RETICCTPCT in the last 72 hours. Urine analysis:    Component Value Date/Time   COLORURINE STRAW (A) 07/27/2017 1543   APPEARANCEUR CLEAR 07/27/2017 1543   LABSPEC 1.014 07/27/2017 1543   PHURINE 7.0 07/27/2017 1543   GLUCOSEU NEGATIVE 07/27/2017 1543   HGBUR NEGATIVE 07/27/2017 1543   BILIRUBINUR NEGATIVE 07/27/2017 1543   KETONESUR NEGATIVE 07/27/2017 1543   PROTEINUR NEGATIVE 07/27/2017 1543   UROBILINOGEN 0.2 02/11/2015 2100   NITRITE NEGATIVE 07/27/2017 1543   LEUKOCYTESUR NEGATIVE 07/27/2017 1543    Radiological Exams on Admission: Personally reviewed and I agree with radiologist reading as below  DG Chest Portable 1 View  Result Date: 05/09/2020 CLINICAL DATA:  49 year old male with lethargy. EXAM: PORTABLE CHEST 1 VIEW COMPARISON:  Chest radiograph dated 07/27/2017. FINDINGS: Bilateral confluent airspace opacities most consistent with multifocal pneumonia, likely viral or atypical in etiology including COVID-19. Clinical correlation and follow-up recommended. No pleural effusion or pneumothorax. Stable cardiac silhouette. No acute osseous pathology. IMPRESSION: Multifocal pneumonia. Electronically Signed   By: Elgie Collard M.D.   On: 05/09/2020 18:24   EKG: Independently reviewed, showing sinus rhythm of 66, LVH  Assessment/Plan  Principal Problem:   Pneumonia due to COVID-19 virus Active Problems:   Down syndrome   Neutropenia (HCC)   Vaccine counseling   # Pneumonia 2/2 to covid-19 infection in setting neutropenia  - Patient is not vaccinated for covid 19 - CXR consistent with covid  - Counseling given regarding vaccine  - Remdesivir started and pharmacy consulted - Dexamethasone  - CMP in the AM - monitor LFT while on remdesivir  - CBC daily - Ceftriaxone and azithromycin to cover for possible superimposed CAP  # Urinary retention - foley catheter in place, minimal urine in foley bag, patient appears comfortable  # history of seizure - resumed home phenobarbital qhs  # HLD - pravastatin 10 mg daily  # Down syndrome   DVT prophylaxis: enoxaparin Code Status: full Diet: regular  Family Communication: discussed with primary care giver, sister at bedside Disposition Plan: pending clinical course Consults called: pharmacy Admission status:   Jennipher Weatherholtz N Ariza Evans D.O. [facility]  If 7AM-7PM, please contact day-coverage provider [email or web address]  05/09/2020, 9:05 PM

## 2020-05-09 NOTE — ED Notes (Addendum)
I tried to do in and out I was not able to get any return from pt. It was hard to get cath in but once in I was not able to get urine from pt.

## 2020-05-09 NOTE — ED Provider Notes (Signed)
[facility] EMERGENCY DEPARTMENT Provider Note   CSN: [id number] Arrival date & time: 05/09/20  1618     History Chief Complaint  Patient presents with  . hypoxia    Kenneth [name] is a 49 y.o. male.  The history is provided by a caregiver and medical records. No language interpreter was used.     49 year old male with history of Down syndrome, congenital heart defect, seizure disorder, sent here from urgent care center accompanied by caregiver for evaluation of generalized fatigue.  Patient is nonverbal.  Caregiver report patient has been experiencing progressive worsening weakness.  History obtained primarily through caregiver.  For the past 3 days patient has not been his normal self.  He appears to be weaker than usual.  He is complaining of having headache, stomach pain, and some recurrent dizziness.  No report of any fever no runny nose sneezing or coughing no report of any dysuria.  Patient has not been vaccinated for COVID-19.  History is limited due to his underlying mental impairment.  Patient was brought to urgent care center for evaluation today but was found to be hypoxic with oxygen ranging from 84% to 90% on room air.  Patient was placed on 2 L of nasal cannula and sent here for evaluation.  Patient was also found to be hypotensive, patient did receive IV fluid on route.  He also vomited small amount while at urgent care center.  Past Medical History:  Diagnosis Date  . Cataracts, bilateral   . Congenital heart defect   . Down syndrome   . HLD (hyperlipidemia)   . Seizure disorder [facility])     Patient Active Problem List   Diagnosis Date Noted  . Agitation 01/29/2020  . Bilateral cataracts 07/28/2019  . Left foot pain 07/09/2019  . Pes planus 07/09/2019  . Tinea pedis 07/09/2019  . 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 Localization-related symptomatic epilepsy and epileptic syndromes  with complex partial seizures, not intractable, without status epilepticus (HCC) 12/22/2016  . Hearing trouble, bilateral 11/16/2016  . 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 DDD (degenerative disc disease), lumbar 08/08/2016  . Constipation 08/08/2016  . Acute pulmonary edema (HCC) 12/15/2015  . Advanced care planning/counseling discussion 07/13/2014  . Medicare annual wellness visit, subsequent 07/11/2013  . Down syndrome   . Seizure disorder (HCC)   . Vitamin B12 deficiency 04/22/2010  . HLD (hyperlipidemia) 02/07/2007    No past surgical history on file.     Family History  Problem Relation Age of Onset  . Diabetes Mother   . Hypertension Mother        Cerebral hemorrhage  . Diabetes Father   . Hypertension Father   . Coronary artery disease Father 59       MI  . Stroke Father   . Cancer Maternal Grandmother        Oral  . Cancer Maternal Aunt        Colon    Social History   Tobacco Use  . Smoking status: Never Smoker  . Smokeless tobacco: Never Used  Vaping Use  . Vaping Use: Never used  Substance Use Topics  . Alcohol use: No  . Drug use: No    Home Medications Prior to Admission medications   Medication Sig Start Date End Date Taking? Authorizing Provider  clotrimazole (LOTRIMIN AF) 1 % cream Apply 1 application topically 2 (two) times daily. 07/09/19   Eustaquio Boyden, MD  diclofenac Sodium (VOLTAREN) 1 % GEL Apply 2 g topically 3 (three) times daily. 07/09/19   Eustaquio Boyden, MD  gabapentin (NEURONTIN) 100 MG capsule TAKE 2 CAPSULES BY MOUTH IN THE MORNING AND 3 CAPSULES IN THE EVENING. (WHITE CAPSULE WITH D 02 OR SG 179) 04/04/20   Van Clines, MD  Ibuprofen 200 MG CAPS Take 400-600mg  twice daily with meals as needed for foot pain 07/09/19   Eustaquio Boyden, MD  Multiple Vitamin (MULTIVITAMIN) tablet Take 1 tablet by mouth daily.    [provider]  PHENobarbital (LUMINAL) 64.8 MG tablet TAKE FOUR (4) TABLETS BY MOUTH DAILY AT BEDTIME. (ROUND  WHITE TABLET WITH E5 757) 04/02/20   Van Clines, MD  pravastatin (PRAVACHOL) 10 MG tablet Take 1 tablet (10 mg total) by mouth daily. 01/29/20   Eustaquio Boyden, MD  QUEtiapine (SEROQUEL) 300 MG tablet TAKE 2 TABLETS BY MOUTH EVERY NIGHT AT BEDTIME (WHITE OBLONG TABLET WITH 259) 03/01/20   Eustaquio Boyden, MD  vitamin B-12 (CYANOCOBALAMIN) 500 MCG tablet Take 500 mcg by mouth daily with breakfast.     [provider]    Allergies    Amoxicillin-pot clavulanate and Penicillins  Review of Systems   Review of Systems  All other systems reviewed and are negative.   Physical Exam Updated Vital Signs BP 126/88 (BP Location: Right Arm)   Pulse 70   Temp 98.4 F (36.9 C) (Oral)   Resp 16   SpO2 (S) (!) 79% Comment: on room air-- placed on 3L/M/[place] sats increased to 97%  Physical Exam Vitals and nursing note reviewed.  Constitutional:      Appearance: He is well-developed.     Comments: Patient appears drowsy and is nonverbal.  HENT:     Head: Atraumatic.     Mouth/Throat:     Mouth: Mucous membranes are dry.  Eyes:     Conjunctiva/sclera: Conjunctivae normal.  Neck:     Comments: No nuchal rigidity Cardiovascular:     Rate and Rhythm: Normal rate and regular rhythm.     Pulses: Normal pulses.     Heart sounds: Normal heart sounds.  Pulmonary:     Breath sounds: No wheezing, rhonchi or rales.  Abdominal:     Palpations: Abdomen is soft.     Tenderness: There is abdominal tenderness (Diffuse abdominal tenderness no guarding rebound tenderness.).  Musculoskeletal:     Cervical back: Neck supple. No rigidity.     Comments: Able to move all 4 extremities with poor effort.  Skin:    General: Skin is dry.     Findings: No rash.  Neurological:     Mental Status: He is disoriented.     ED Results / Procedures / Treatments   Labs (all labs ordered are listed, but only abnormal results are displayed) Labs Reviewed  RESPIRATORY PANEL BY RT PCR (FLU A&B, COVID) -  Abnormal; Notable for the following components:      Result Value   SARS Coronavirus 2 by RT PCR POSITIVE (*)    All other components within normal limits  CBC WITH DIFFERENTIAL/PLATELET - Abnormal; Notable for the following components:   WBC 2.9 (*)    RBC 3.82 (*)    Hemoglobin 12.7 (*)    MCV 102.6 (*)    RDW 15.7 (*)    Neutro Abs 1.4 (*)    All other components within normal limits  COMPREHENSIVE METABOLIC PANEL - Abnormal; Notable for the following components:   Calcium 8.0 (*)  Total Protein 6.1 (*)    Albumin 2.6 (*)    AST 62 (*)    All other components within normal limits  LACTIC ACID, PLASMA  URINALYSIS, ROUTINE W REFLEX MICROSCOPIC  TROPONIN I (HIGH SENSITIVITY)  TROPONIN I (HIGH SENSITIVITY)    EKG None   Date: 05/09/2020  Rate: 68  Rhythm: normal sinus rhythm  QRS Axis: normal  Intervals: normal  ST/T Wave abnormalities: normal  Conduction Disutrbances: none  Narrative Interpretation:   Old EKG Reviewed: No significant changes noted     Radiology DG Chest Portable 1 View  Result Date: 05/09/2020 CLINICAL DATA:  49 year old male with lethargy. EXAM: PORTABLE CHEST 1 VIEW COMPARISON:  Chest radiograph dated 07/27/2017. FINDINGS: Bilateral confluent airspace opacities most consistent with multifocal pneumonia, likely viral or atypical in etiology including COVID-19. Clinical correlation and follow-up recommended. No pleural effusion or pneumothorax. Stable cardiac silhouette. No acute osseous pathology. IMPRESSION: Multifocal pneumonia. Electronically Signed   By: Elgie Collard M.D.   On: 05/09/2020 18:24    Procedures .Critical Care Performed by: Fayrene Helper, PA-C Authorized by: Fayrene Helper, PA-C   Critical care provider statement:    Critical care time (minutes):  32   Critical care was time spent personally by me on the following activities:  Discussions with consultants, evaluation of patient's response to treatment, examination of patient,  ordering and performing treatments and interventions, ordering and review of laboratory studies, ordering and review of radiographic studies, pulse oximetry, re-evaluation of patient's condition, obtaining history from patient or surrogate and review of old charts   (including critical care time)  Medications Ordered in ED Medications  dexamethasone (DECADRON) injection 6 mg (has no administration in time range)    ED Course  I have reviewed the triage vital signs and the nursing notes.  Pertinent labs & imaging results that were available during my care of the patient were reviewed by me and considered in my medical decision making (see chart for details).    MDM Rules/Calculators/A&P                          BP (!) 129/92   Pulse 70   Temp 98.4 F (36.9 C) (Oral)   Resp 20   SpO2 93%   Final Clinical Impression(s) / ED Diagnoses Final diagnoses:  Acute hypoxemic respiratory failure due to severe acute respiratory syndrome coronavirus 2 (SARS-CoV-2) disease (HCC)    Rx / DC Orders ED Discharge Orders    None     5:35 PM Patient sent here for hypotension and hypoxia with generalized weakness for the past 3 days.  He has not been vaccinated for COVID-19.  No report of any fever and he was afebrile here.  Labs remarkable for leukopenia with a WBC of 2.9.  8:12 PM COVID-19 test is positive.  Chest x-ray shows multifocal pneumonia.  Due to his hypoxia, dexamethasone was given.  Appreciate consultation from [facility], Dr. Sedalia Muta who agrees to see and admit patient for further care.  Patient is not on home oxygen at baseline.  Care discussed with Dr. Rubin Payor.   Kenneth [name] was evaluated in Emergency Department on 05/09/2020 for the symptoms described in the history of present illness. He was evaluated in the context of the global COVID-19 pandemic, which necessitated consideration that the patient might be at risk for infection with the SARS-CoV-2 virus that causes  COVID-19. Institutional protocols and algorithms that pertain to the evaluation of patients  at risk for COVID-19 are in a state of rapid change based on information released by regulatory bodies including the CDC and federal and state organizations. These policies and algorithms were followed during the patient's care in the ED.    Fayrene Helperran, Mandisa Persinger, PA-C 05/09/20 2013    Benjiman CorePickering, Nathan, MD 05/09/20 2158

## 2020-05-09 NOTE — ED Notes (Signed)
While in room pt tried to pee but was unable to

## 2020-05-09 NOTE — ED Notes (Signed)
EMS called for emergent transport, O2 3L per [place] with SpO2 95%. IV placed

## 2020-05-09 NOTE — ED Triage Notes (Signed)
Pt caregiver reports pt  Reported Pt did not feel good . Pt observed having difficulty walking to room the patient placed in [facility] to room. DR nelson in room while vital signs were done.

## 2020-05-09 NOTE — ED Notes (Signed)
CBG performed.

## 2020-05-10 ENCOUNTER — Encounter: Payer: Self-pay | Admitting: Family Medicine

## 2020-05-10 ENCOUNTER — Inpatient Hospital Stay (HOSPITAL_COMMUNITY): Payer: 59

## 2020-05-10 DIAGNOSIS — J9601 Acute respiratory failure with hypoxia: Secondary | ICD-10-CM

## 2020-05-10 DIAGNOSIS — U071 COVID-19: Principal | ICD-10-CM

## 2020-05-10 LAB — COMPREHENSIVE METABOLIC PANEL
ALT: 37 U/L (ref 0–44)
AST: 39 U/L (ref 15–41)
Albumin: 2.6 g/dL — ABNORMAL LOW (ref 3.5–5.0)
Alkaline Phosphatase: 102 U/L (ref 38–126)
Anion gap: 11 (ref 5–15)
BUN: 8 mg/dL (ref 6–20)
CO2: 26 mmol/L (ref 22–32)
Calcium: 8.3 mg/dL — ABNORMAL LOW (ref 8.9–10.3)
Chloride: 105 mmol/L (ref 98–111)
Creatinine, Ser: 0.95 mg/dL (ref 0.61–1.24)
GFR, Estimated: >60 mL/min (ref >60)
Glucose, Bld: 126 mg/dL — ABNORMAL HIGH (ref 70–99)
Potassium: 4.1 mmol/L (ref 3.5–5.1)
Sodium: 142 mmol/L (ref 135–145)
Total Bilirubin: 0.6 mg/dL (ref 0.3–1.2)
Total Protein: 6.5 g/dL (ref 6.5–8.1)

## 2020-05-10 LAB — CBC WITH DIFFERENTIAL/PLATELET
Abs Immature Granulocytes: 0.1 10*3/uL — ABNORMAL HIGH (ref 0.00–0.07)
Basophils Absolute: 0 10*3/uL (ref 0.0–0.1)
Basophils Relative: 1 %
Eosinophils Absolute: 0 10*3/uL (ref 0.0–0.5)
Eosinophils Relative: 0 %
HCT: 38.3 % — ABNORMAL LOW (ref 39.0–52.0)
Hemoglobin: 12.7 g/dL — ABNORMAL LOW (ref 13.0–17.0)
Immature Granulocytes: 5 %
Lymphocytes Relative: 27 %
Lymphs Abs: 0.6 10*3/uL — ABNORMAL LOW (ref 0.7–4.0)
MCH: 33.2 pg (ref 26.0–34.0)
MCHC: 33.2 g/dL (ref 30.0–36.0)
MCV: 100 fL (ref 80.0–100.0)
Monocytes Absolute: 0.2 10*3/uL (ref 0.1–1.0)
Monocytes Relative: 9 %
Neutro Abs: 1.3 10*3/uL — ABNORMAL LOW (ref 1.7–7.7)
Neutrophils Relative %: 58 %
Platelets: 224 10*3/uL (ref 150–400)
RBC: 3.83 MIL/uL — ABNORMAL LOW (ref 4.22–5.81)
RDW: 15.1 % (ref 11.5–15.5)
WBC: 2.1 10*3/uL — ABNORMAL LOW (ref 4.0–10.5)
nRBC: 0 % (ref 0.0–0.2)

## 2020-05-10 LAB — URINALYSIS, ROUTINE W REFLEX MICROSCOPIC
Bilirubin Urine: NEGATIVE
Glucose, UA: NEGATIVE mg/dL
Ketones, ur: NEGATIVE mg/dL
Leukocytes,Ua: NEGATIVE
Nitrite: NEGATIVE
Protein, ur: NEGATIVE mg/dL
RBC / HPF: >50 RBC/hpf — ABNORMAL HIGH (ref 0–5)
Specific Gravity, Urine: 1.008 (ref 1.005–1.030)
pH: 7 (ref 5.0–8.0)

## 2020-05-10 LAB — HIV ANTIBODY (ROUTINE TESTING W REFLEX): HIV Screen 4th Generation wRfx: NONREACTIVE

## 2020-05-10 LAB — D-DIMER, QUANTITATIVE: D-Dimer, Quant: 7.73 ug/mL-FEU — ABNORMAL HIGH (ref 0.00–0.50)

## 2020-05-10 LAB — C-REACTIVE PROTEIN: CRP: 14 mg/dL — ABNORMAL HIGH (ref <1.0)

## 2020-05-10 LAB — FERRITIN: Ferritin: 1072 ng/mL — ABNORMAL HIGH (ref 24–336)

## 2020-05-10 LAB — PROCALCITONIN: Procalcitonin: <0.1 ng/mL

## 2020-05-10 LAB — MAGNESIUM: Magnesium: 2.3 mg/dL (ref 1.7–2.4)

## 2020-05-10 LAB — BRAIN NATRIURETIC PEPTIDE: B Natriuretic Peptide: 104.3 pg/mL — ABNORMAL HIGH (ref 0.0–100.0)

## 2020-05-10 MED ORDER — ENOXAPARIN SODIUM 80 MG/0.8ML ~~LOC~~ SOLN
70.0000 mg | Freq: Two times a day (BID) | SUBCUTANEOUS | Status: DC
  Administered 2020-05-10 – 2020-05-14 (×9): 70 mg via SUBCUTANEOUS
  Filled 2020-05-10 (×9): qty 0.8

## 2020-05-10 MED ORDER — BLISTEX MEDICATED EX OINT
TOPICAL_OINTMENT | CUTANEOUS | Status: DC | PRN
  Filled 2020-05-10 (×2): qty 6.3

## 2020-05-10 MED ORDER — IOHEXOL 350 MG/ML SOLN
75.0000 mL | Freq: Once | INTRAVENOUS | Status: AC | PRN
  Administered 2020-05-10: 75 mL via INTRAVENOUS

## 2020-05-10 MED ORDER — METHYLPREDNISOLONE SODIUM SUCC 125 MG IJ SOLR
60.0000 mg | Freq: Two times a day (BID) | INTRAMUSCULAR | Status: DC
  Administered 2020-05-10 – 2020-05-14 (×9): 60 mg via INTRAVENOUS
  Filled 2020-05-10 (×10): qty 2

## 2020-05-10 MED ORDER — INFLUENZA VAC SPLIT QUAD 0.5 ML IM SUSY
0.5000 mL | PREFILLED_SYRINGE | INTRAMUSCULAR | Status: AC
  Administered 2020-05-11: 0.5 mL via INTRAMUSCULAR
  Filled 2020-05-10: qty 0.5

## 2020-05-10 MED ORDER — LACTATED RINGERS IV SOLN: INTRAVENOUS | Status: AC

## 2020-05-10 NOTE — Progress Notes (Signed)
ANTICOAGULATION CONSULT NOTE - Initial Consult  Pharmacy Consult for Lovenox Indication: COVID PNA and elevated d-dimer  Allergies  Allergen Reactions   Amoxicillin-Pot Clavulanate Palpitations and Other (See Comments)    "Felt like I was having a heart attack." Has patient had a PCN reaction causing immediate rash, facial/tongue/throat swelling, SOB or lightheadedness with hypotension: No Has patient had a PCN reaction causing severe rash involving mucus membranes or skin necrosis: No Has patient had a PCN reaction that required hospitalization: No Has patient had a PCN reaction occurring within the last 10 years: No If all of the above answers are "NO", then Ferraiolo proceed with Cephalosporin use.  unknown   Penicillins Itching, Rash and Palpitations    Has patient had a PCN reaction causing immediate rash, facial/tongue/throat swelling, SOB or lightheadedness with hypotension: No Has patient had a PCN reaction causing severe rash involving mucus membranes or skin necrosis: No Has patient had a PCN reaction that required hospitalization: Unknown Has patient had a PCN reaction occurring within the last 10 years: No If all of the above answers are "NO", then Aye proceed with Cephalosporin use.  "Felt like I was having a heart attack." Has patient had a PCN reaction causing immediate rash, facial/tongue/throat swelling, SOB or lightheadedness with hypotension: No Has patient had a PCN reaction causing severe rash involving mucus membranes or skin necrosis: No Has patient had a PCN reaction that required hospitalization: No Has patient had a PCN reaction occurring within the last 10 years: No If all of the above answers are "NO", then [place] proceed with Cephalosporin use. Has patient had a PCN reaction causing immediate rash, facial/tongue/throat swelling, SOB or lightheadedness with hypotension: No Has patient had a PCN reaction causing severe rash involving mucus membranes or skin necrosis:  No Has patient had a PCN reaction that required hospitalization: Unknown Has patient had a PCN reaction occurring within the last 10 years: No If all of the above answers are "NO", then Ruminski proceed with Cephalosporin use.    Patient Measurements: Height: 5\' 4"  (162.6 cm) Weight: 74 kg (163 lb 2.3 oz) IBW/kg (Calculated) : 59.2  Vital Signs: Temp: 97.5 F (36.4 C) (10/11 0402) Temp Source: Axillary (10/11 0402) BP: 124/92 (10/11 0402) Pulse Rate: 85 (10/11 0603)  Labs: Recent Labs    05/09/20 1651 05/10/20 0439  HGB 12.7* 12.7*  HCT 39.2 38.3*  PLT 170 224  CREATININE 1.13 0.95  TROPONINIHS 9  --     Estimated Creatinine Clearance: 87.6 mL/min (by C-G formula based on SCr of 0.95 mg/dL).   Medical History: Past Medical History:  Diagnosis Date   Cataracts, bilateral    Congenital heart defect    Down syndrome    HLD (hyperlipidemia)    Seizure disorder (HCC)     Medications:  Medications Prior to Admission  Medication Sig Dispense Refill Last Dose   gabapentin (NEURONTIN) 100 MG capsule TAKE 2 CAPSULES BY MOUTH IN THE MORNING AND 3 CAPSULES IN THE EVENING. (WHITE CAPSULE WITH D 02 OR SG 179) (Patient taking differently: Take 200-300 mg by mouth See admin instructions. TAKE 2 CAPSULES BY MOUTH IN THE MORNING AND 3 CAPSULES IN THE EVENING. (WHITE CAPSULE WITH D 02 OR SG 179)) 155 capsule 11 05/09/2020 at Unknown time   Ibuprofen 200 MG CAPS Take 400-600mg  twice daily with meals as needed for foot pain (Patient taking differently: Take 400-600 capsules by mouth See admin instructions. Take 400 mg tablet when pain starts, if pain continues take  600 mg)   05/09/2020 at Unknown time   Multiple Vitamin (MULTIVITAMIN) tablet Take 1 tablet by mouth daily.   05/09/2020 at Unknown time   PHENobarbital (LUMINAL) 64.8 MG tablet TAKE FOUR (4) TABLETS BY MOUTH DAILY AT BEDTIME. (ROUND WHITE TABLET WITH E5 757) (Patient taking differently: Take 259.2 mg by mouth at bedtime.  TAKE FOUR (4) TABLETS BY MOUTH DAILY AT BEDTIME. (ROUND WHITE TABLET WITH E5 757)) 124 tablet 5 05/08/2020 at Unknown time   pravastatin (PRAVACHOL) 10 MG tablet Take 1 tablet (10 mg total) by mouth daily. 31 tablet 11 05/08/2020 at Unknown time   QUEtiapine (SEROQUEL) 300 MG tablet TAKE 2 TABLETS BY MOUTH EVERY NIGHT AT BEDTIME (WHITE OBLONG TABLET WITH 259) (Patient taking differently: Take 600 mg by mouth at bedtime. ) 62 tablet 3 05/08/2020 at Unknown time   vitamin B-12 (CYANOCOBALAMIN) 500 MCG tablet Take 500 mcg by mouth daily with breakfast.    05/09/2020 at Unknown time   clotrimazole (LOTRIMIN AF) 1 % cream Apply 1 application topically 2 (two) times daily. (Patient not taking: Reported on 05/09/2020)   Not Taking at Unknown time   diclofenac Sodium (VOLTAREN) 1 % GEL Apply 2 g topically 3 (three) times daily. (Patient not taking: Reported on 05/09/2020) 100 g 1 Not Taking at Unknown time    Assessment: 49 y.o. male with COVID PNA and elevated d-dimer for Lovenox Goal of Therapy:  Full anticoagulation with Lovenox Monitor platelets by anticoagulation protocol: Yes   Plan:  Lovenox 70 mg SQ q12h  Eddie Candle 05/10/2020,7:25 AM

## 2020-05-10 NOTE — Progress Notes (Signed)
Patient arrived to unit at approximately 2220. Patient alert. Patient was not answering many questions at first and was slightly resistant to bath and assessment d/t being cold. Not patient A&O to person, very talkative, asking what everything is and very pleasant and agreeable. Patient took meds whole with no difficulty. Sister called. This RN updated sister. Sister plans to come tomorrow morning with her belongings so that she can stay with patient and not have to leave. Per sister, patient was repeatedly stating the he was going home when in the ED. Patient oriented to unit and encouraged to call for assistance. Patient resting in bed, no distress noted. Will continue to monitor.

## 2020-05-10 NOTE — Progress Notes (Addendum)
PROGRESS NOTE                                                                                                                                                                                                             Patient Demographics:    Kenneth [name], is a 49 y.o. male, DOB - Sep 01, 1970, ONG:[id number]  Outpatient Primary MD for the patient is Eustaquio BoydenGutierrez, Javier, MD    LOS - 1  Admit date - 05/09/2020    Chief Complaint  Patient presents with  . hypoxia       Brief Narrative - Kenneth [name] is a [age] y.o. male with medical history significant for abdominal discomfort and generalized fatigue, in the ER he was diagnosed with acute hypoxic respiratory failure due to COVID-19 pneumonia and admitted to the hospital.  Unfortunately he is not vaccinated.      Subjective:    Kenneth [name] today has, No headache, No chest pain, No abdominal pain - No Nausea, No new weakness tingling or numbness, he denies any shortness of breath but overall appears quite fatigued today.   Assessment  & Plan :     1. Acute Hypoxic Resp. Failure due to Acute Covid 19 Viral Pneumonitis during the ongoing 2020 Covid 19 Pandemic - he is unfortunately unvaccinated and has incurred moderate parenchymal lung injury, he has been started on combination of IV steroids and Remdesivir, POA sister has consented for Actemra use if any further clinical decline, will monitor closely if needed Actemra will be used.  Encouraged the patient to sit up in chair in the daytime use I-S and flutter valve for pulmonary toiletry and then prone in bed when at night.  Will advance activity and titrate down oxygen as possible.  Actemra/Baricitinib  off label use - patient/POA - sister Kenneth BradfordKimberly-  was told that if COVID-19 pneumonitis gets worse we might potentially use Actemra off label, patient denies any known history of active diverticulitis, tuberculosis or hepatitis,  understands the risks and benefits and wants to proceed with Actemra treatment if required.    SpO2: 90 % O2 Flow Rate (L/min): 2 L/min  Recent Labs  Lab 05/09/20 1651 05/09/20 1731 05/09/20 1740 05/10/20 0439 05/10/20 0453  WBC 2.9*  --   --  2.1*  --   HGB 12.7*  --   --  12.7*  --  HCT 39.2  --   --  38.3*  --   PLT 170  --   --  224  --   CRP  --   --   --  14.0*  --   BNP  --   --   --  104.3*  --   DDIMER  --   --   --  7.73*  --   PROCALCITON  --   --   --   --  <0.10  AST 62*  --   --  39  --   ALT 37  --   --  37  --   ALKPHOS 95  --   --  102  --   BILITOT 1.1  --   --  0.6  --   ALBUMIN 2.6*  --   --  2.6*  --   LATICACIDVEN  --   --  1.5  --   --   SARSCOV2NAA  --  POSITIVE*  --   --   --     2.  Urinary retention.  Has condom catheter, had 500 cc on bladder scan, after encouragement was able to clear his bladder, will place on Flomax and monitor.  3.  Seizures.  On phenobarbital.  4.  Dyslipidemia.  On statin.  5.  History of Down syndrome.  Supportive care.  6.  Elevated D-dimer.  Most likely due to inflammation, check leg ultrasound and CTA, high-dose Lovenox till D-dimer drops below 2.  Very high risk for developing a clot.      Condition - Extremely Guarded  Family Communication  : sister Kenneth [name]  [phone]) POA bediside 05/10/20  Code Status :  Full  Consults  :  None  Procedures  :  None  PUD Prophylaxis : None  Disposition Plan  :    Status is: Inpatient  Remains inpatient appropriate because:IV treatments appropriate due to intensity of illness or inability to take PO   Dispo: The patient is from: Home              Anticipated d/c is to: Home              Anticipated d/c date is: > 3 days              Patient currently is not medically stable to d/c.   DVT Prophylaxis  :  Lovenox   Lab Results  Component Value Date   PLT 224 05/10/2020    Diet :  Diet Order            Diet regular Room service appropriate? Yes;  Fluid consistency: Thin  Diet effective now                  Inpatient Medications  Scheduled Meds: . dexamethasone  6 mg Oral Q24H  . enoxaparin (LOVENOX) injection  70 mg Subcutaneous Q12H  . [START ON 05/11/2020] influenza vac split quadrivalent PF  0.5 mL Intramuscular Tomorrow-1000  . multivitamin with minerals  1 tablet Oral Daily  . phenobarbital  259.2 mg Oral QHS  . pravastatin  10 mg Oral Daily  . QUEtiapine  300 mg Oral QHS  . vitamin B-12  500 mcg Oral Q breakfast   Continuous Infusions: . sodium chloride Stopped (05/10/20 0150)  . remdesivir 100 mg in NS 100 mL     Followed by  . [START ON 05/11/2020] remdesivir 100 mg in NS 100 mL     PRN Meds:.sodium  chloride, lip balm  Antibiotics  :    Anti-infectives (From admission, onward)   Start     Dose/Rate Route Frequency Ordered Stop   05/11/20 1000  remdesivir 100 mg in sodium chloride 0.9 % 100 mL IVPB       "Followed by" Linked Group Details   100 mg 200 mL/hr over 30 Minutes Intravenous Daily 05/09/20 2130 05/14/20 0959   05/10/20 1800  remdesivir 100 mg in sodium chloride 0.9 % 100 mL IVPB       "Followed by" Linked Group Details   100 mg 200 mL/hr over 30 Minutes Intravenous Daily-1800 05/09/20 2130 05/11/20 1759   05/10/20 1000  remdesivir 100 mg in sodium chloride 0.9 % 100 mL IVPB  Status:  Discontinued       "Followed by" Linked Group Details   100 mg 200 mL/hr over 30 Minutes Intravenous Daily 05/09/20 2150 05/09/20 2208   05/09/20 2330  azithromycin (ZITHROMAX) 500 mg in sodium chloride 0.9 % 250 mL IVPB  Status:  Discontinued        500 mg 250 mL/hr over 60 Minutes Intravenous Every 24 hours 05/09/20 2150 05/10/20 0712   05/09/20 2300  cefTRIAXone (ROCEPHIN) 1 g in sodium chloride 0.9 % 100 mL IVPB  Status:  Discontinued        1 g 200 mL/hr over 30 Minutes Intravenous Every 24 hours 05/09/20 2150 05/10/20 0623   05/09/20 2230  remdesivir 200 mg in sodium chloride 0.9% 250 mL IVPB         "Followed by" Linked Group Details   200 mg 580 mL/hr over 30 Minutes Intravenous Once 05/09/20 2130 05/10/20 0017   05/09/20 2150  remdesivir 200 mg in sodium chloride 0.9% 250 mL IVPB  Status:  Discontinued       "Followed by" Linked Group Details   200 mg 580 mL/hr over 30 Minutes Intravenous Once 05/09/20 2150 05/09/20 2208       Time Spent in minutes  30   Susa Raring M.D on 05/10/2020 at 11:00 AM  To page go to [email or web address] - password [facility]  [facility] -  Office  [phone]   See all Orders from today for further details    Objective:   Vitals:   05/10/20 0159 05/10/20 0402 05/10/20 0603 05/10/20 0739  BP:  (!) 124/92  129/83  Pulse: 84 89 85   Resp: 18 20 (!) 21 (!) 22  Temp:  (!) 97.5 F (36.4 C)    TempSrc:  Axillary  Axillary  SpO2: 93% 93% 92% 90%  Weight:      Height:        Wt Readings from Last 3 Encounters:  05/09/20 74 kg  05/09/20 74.8 kg  01/29/20 74.2 kg     Intake/Output Summary (Last 24 hours) at 05/10/2020 1100 Last data filed at 05/10/2020 1011 Gross per 24 hour  Intake 954.57 ml  Output 1700 ml  Net -745.43 ml     Physical Exam  Awake , appears fatigued, No new F.N deficits,   [place].AT,PERRAL Supple Neck,No JVD, No cervical lymphadenopathy appriciated.  Symmetrical Chest wall movement, Good air movement bilaterally, CTAB RRR,No Gallops,Rubs or new Murmurs, No Parasternal Heave +ve B.Sounds, Abd Soft, No tenderness, No organomegaly appriciated, No rebound - guarding or rigidity. No Cyanosis, Clubbing or edema, No new Rash or bruise      Data Review:    CBC Recent Labs  Lab 05/09/20 1651 05/10/20 0439  WBC 2.9* 2.1*  HGB 12.7*  12.7*  HCT 39.2 38.3*  PLT 170 224  MCV 102.6* 100.0  MCH 33.2 33.2  MCHC 32.4 33.2  RDW 15.7* 15.1  LYMPHSABS 1.0 0.6*  MONOABS 0.5 0.2  EOSABS 0.0 0.0  BASOSABS 0.0 0.0    Recent Labs  Lab 05/09/20 1651 05/09/20 1740 05/10/20 0439 05/10/20 0453  NA 139  --  142  --    K 5.0  --  4.1  --   CL 103  --  105  --   CO2 28  --  26  --   GLUCOSE 85  --  126*  --   BUN 10  --  8  --   CREATININE 1.13  --  0.95  --   CALCIUM 8.0*  --  8.3*  --   AST 62*  --  39  --   ALT 37  --  37  --   ALKPHOS 95  --  102  --   BILITOT 1.1  --  0.6  --   ALBUMIN 2.6*  --  2.6*  --   MG  --   --  2.3  --   CRP  --   --  14.0*  --   DDIMER  --   --  7.73*  --   PROCALCITON  --   --   --  <0.10  LATICACIDVEN  --  1.5  --   --   BNP  --   --  104.3*  --     ------------------------------------------------------------------------------------------------------------------ No results for input(s): CHOL, HDL, LDLCALC, TRIG, CHOLHDL, LDLDIRECT in the last 72 hours.  No results found for: HGBA1C ------------------------------------------------------------------------------------------------------------------ No results for input(s): TSH, T4TOTAL, T3FREE, THYROIDAB in the last 72 hours.  Invalid input(s): FREET3  Cardiac Enzymes No results for input(s): CKMB, TROPONINI, MYOGLOBIN in the last 168 hours.  Invalid input(s): CK ------------------------------------------------------------------------------------------------------------------    Component Value Date/Time   BNP 104.3 (H) 05/10/2020 0439    Micro Results Recent Results (from the past 240 hour(s))  Respiratory Panel by RT PCR (Flu A&B, Covid) - Nasopharyngeal Swab     Status: Abnormal   Collection Time: 05/09/20  5:31 PM   Specimen: Nasopharyngeal Swab  Result Value Ref Range Status   SARS Coronavirus 2 by RT PCR POSITIVE (A) NEGATIVE Final    Comment: RESULT CALLED TO, READ BACK BY AND VERIFIED WITH: RN D SIDBURY [id number] MLM (NOTE) SARS-CoV-2 target nucleic acids are DETECTED.  SARS-CoV-2 RNA is generally detectable in upper respiratory specimens  during the acute phase of infection. Positive results are indicative of the presence of the identified virus, but do not rule out bacterial infection  or co-infection with other pathogens not detected by the test. Clinical correlation with patient history and other diagnostic information is necessary to determine patient infection status. The expected result is Negative.  Fact Sheet for Patients:  https://www.moore.com/  Fact Sheet for Healthcare Providers: https://www.young.biz/  This test is not yet approved or cleared by the [place] FDA and  has been authorized for detection and/or diagnosis of SARS-CoV-2 by FDA under an Emergency Use Authorization (EUA).  This EUA will remain in effect (meaning this test can be used)  for the duration of  the COVID-19 declaration under Section 564(b)(1) of the Act, 21 U.S.C. section 360bbb-3(b)(1), unless the authorization is terminated or revoked sooner.      Influenza A by PCR NEGATIVE NEGATIVE Final   Influenza B by PCR NEGATIVE NEGATIVE Final  Comment: (NOTE) The Xpert Xpress SARS-CoV-2/FLU/RSV assay is intended as an aid in  the diagnosis of influenza from Nasopharyngeal swab specimens and  should not be used as a sole basis for treatment. Nasal washings and  aspirates are unacceptable for Xpert Xpress SARS-CoV-2/FLU/RSV  testing.  Fact Sheet for Patients: https://www.moore.com/  Fact Sheet for Healthcare Providers: https://www.young.biz/  This test is not yet approved or cleared by the [place] FDA and  has been authorized for detection and/or diagnosis of SARS-CoV-2 by  FDA under an Emergency Use Authorization (EUA). This EUA will remain  in effect (meaning this test can be used) for the duration of the  Covid-19 declaration under Section 564(b)(1) of the Act, 21  U.S.C. section 360bbb-3(b)(1), unless the authorization is  terminated or revoked. Performed at [facility], [address], [place], [place] [address]     Radiology Reports DG Chest Portable 1 View  Result Date:  05/09/2020 CLINICAL DATA:  49 year old male with lethargy. EXAM: PORTABLE CHEST 1 VIEW COMPARISON:  Chest radiograph dated 07/27/2017. FINDINGS: Bilateral confluent airspace opacities most consistent with multifocal pneumonia, likely viral or atypical in etiology including COVID-19. Clinical correlation and follow-up recommended. No pleural effusion or pneumothorax. Stable cardiac silhouette. No acute osseous pathology. IMPRESSION: Multifocal pneumonia. Electronically Signed   By: Elgie Collard M.D.   On: 05/09/2020 18:24

## 2020-05-10 NOTE — Progress Notes (Signed)
PT Cancellation Note  Patient Details Name: Kenneth [name] MRN: [id number] DOB: 1970-08-25   Cancelled Treatment:    Reason Eval/Treat Not Completed: Patient at procedure or test/unavailable. Pt currently off the floor for test.    Angelina Ok [facility] 05/10/2020, 2:16 PM Skip Mayer PT Acute Rehabilitation Services Pager [phone] Office [phone]

## 2020-05-10 NOTE — Progress Notes (Signed)
Patient resting in bed, drowsy but fidgeting. Patient pulled out IV, took off O2 sensor, and has taken off oxygen twice. SpO2 dropped to as low as 77% noted on RA. O2 2L [place] re-applied. O2 sensor re-applied. Remaining IV wrapped with gauze. Patient reminded to leave these in place. Staff will order soft mitts and observe patient frequently. Will continue to monitor.

## 2020-05-11 ENCOUNTER — Inpatient Hospital Stay (HOSPITAL_COMMUNITY): Payer: 59

## 2020-05-11 LAB — CBC WITH DIFFERENTIAL/PLATELET
Abs Immature Granulocytes: 0.07 10*3/uL (ref 0.00–0.07)
Basophils Absolute: 0 10*3/uL (ref 0.0–0.1)
Basophils Relative: 1 %
Eosinophils Absolute: 0 10*3/uL (ref 0.0–0.5)
Eosinophils Relative: 0 %
HCT: 41 % (ref 39.0–52.0)
Hemoglobin: 13.7 g/dL (ref 13.0–17.0)
Immature Granulocytes: 2 %
Lymphocytes Relative: 31 %
Lymphs Abs: 0.9 10*3/uL (ref 0.7–4.0)
MCH: 33.7 pg (ref 26.0–34.0)
MCHC: 33.4 g/dL (ref 30.0–36.0)
MCV: 101 fL — ABNORMAL HIGH (ref 80.0–100.0)
Monocytes Absolute: 0.2 10*3/uL (ref 0.1–1.0)
Monocytes Relative: 6 %
Neutro Abs: 1.7 10*3/uL (ref 1.7–7.7)
Neutrophils Relative %: 60 %
Platelets: 290 10*3/uL (ref 150–400)
RBC: 4.06 MIL/uL — ABNORMAL LOW (ref 4.22–5.81)
RDW: 14.9 % (ref 11.5–15.5)
WBC: 2.9 10*3/uL — ABNORMAL LOW (ref 4.0–10.5)
nRBC: 0 % (ref 0.0–0.2)

## 2020-05-11 LAB — BRAIN NATRIURETIC PEPTIDE: B Natriuretic Peptide: 112.2 pg/mL — ABNORMAL HIGH (ref 0.0–100.0)

## 2020-05-11 LAB — COMPREHENSIVE METABOLIC PANEL
ALT: 72 U/L — ABNORMAL HIGH (ref 0–44)
AST: 66 U/L — ABNORMAL HIGH (ref 15–41)
Albumin: 2.7 g/dL — ABNORMAL LOW (ref 3.5–5.0)
Alkaline Phosphatase: 97 U/L (ref 38–126)
Anion gap: 10 (ref 5–15)
BUN: 12 mg/dL (ref 6–20)
CO2: 26 mmol/L (ref 22–32)
Calcium: 8.6 mg/dL — ABNORMAL LOW (ref 8.9–10.3)
Chloride: 103 mmol/L (ref 98–111)
Creatinine, Ser: 0.99 mg/dL (ref 0.61–1.24)
GFR, Estimated: >60 mL/min (ref >60)
Glucose, Bld: 140 mg/dL — ABNORMAL HIGH (ref 70–99)
Potassium: 5.2 mmol/L — ABNORMAL HIGH (ref 3.5–5.1)
Sodium: 139 mmol/L (ref 135–145)
Total Bilirubin: 0.5 mg/dL (ref 0.3–1.2)
Total Protein: 6.7 g/dL (ref 6.5–8.1)

## 2020-05-11 LAB — D-DIMER, QUANTITATIVE: D-Dimer, Quant: 5.46 ug/mL-FEU — ABNORMAL HIGH (ref 0.00–0.50)

## 2020-05-11 LAB — MAGNESIUM: Magnesium: 2.4 mg/dL (ref 1.7–2.4)

## 2020-05-11 LAB — PROCALCITONIN: Procalcitonin: <0.1 ng/mL

## 2020-05-11 LAB — C-REACTIVE PROTEIN: CRP: 10.3 mg/dL — ABNORMAL HIGH (ref <1.0)

## 2020-05-11 MED ORDER — SODIUM ZIRCONIUM CYCLOSILICATE 10 G PO PACK
10.0000 g | PACK | Freq: Two times a day (BID) | ORAL | Status: AC
  Administered 2020-05-11 (×2): 10 g via ORAL
  Filled 2020-05-11 (×2): qty 1

## 2020-05-11 MED ORDER — TAMSULOSIN HCL 0.4 MG PO CAPS
0.4000 mg | ORAL_CAPSULE | Freq: Every day | ORAL | Status: DC
  Administered 2020-05-11 – 2020-05-14 (×4): 0.4 mg via ORAL
  Filled 2020-05-11 (×4): qty 1

## 2020-05-11 MED ORDER — MAGNESIUM HYDROXIDE 400 MG/5ML PO SUSP
30.0000 mL | Freq: Two times a day (BID) | ORAL | Status: AC
  Administered 2020-05-11: 30 mL via ORAL
  Filled 2020-05-11 (×2): qty 30

## 2020-05-11 MED ORDER — ONDANSETRON HCL 4 MG/2ML IJ SOLN
4.0000 mg | Freq: Four times a day (QID) | INTRAMUSCULAR | Status: DC | PRN
  Administered 2020-05-11 – 2020-05-13 (×4): 4 mg via INTRAVENOUS
  Filled 2020-05-11 (×4): qty 2

## 2020-05-11 MED ORDER — FUROSEMIDE 40 MG PO TABS
40.0000 mg | ORAL_TABLET | Freq: Once | ORAL | Status: AC
  Administered 2020-05-11: 40 mg via ORAL
  Filled 2020-05-11: qty 1

## 2020-05-11 MED ORDER — SODIUM POLYSTYRENE SULFONATE 15 GM/60ML PO SUSP
30.0000 g | Freq: Once | ORAL | Status: AC
  Administered 2020-05-11: 30 g via ORAL
  Filled 2020-05-11: qty 120

## 2020-05-11 NOTE — Evaluation (Signed)
Physical Therapy Evaluation Patient Details Name: Kenneth [name] MRN: [id number] DOB: October 03, 1970 Today's Date: 05/11/2020   History of Present Illness  Pt is a 49 y.o. male presenting 101021 with reported weakness per caregiver. Tested COVID-19 positive; not vaccinated. PMH includes Down syndrome, seizures, congenital heart defect, acute pulmonary edema, osteoarthritis, avascular necrosis, bilateral cataracts, agitation, HLD.    Clinical Impression  Pt presents with an overall decrease in functional mobility secondary to above. PTA, pt lives with 24/7 support from family; sister (who serves as pt's full-time caregiver) present and reports pt mostly independent with mobility and ADL tasks with supervision from family. Initiated education re: current condition, O2 needs, activity recommendations, positioning, therex, energy conservation, and importance of mobility. Today, pt able to stand and take steps to chair, requiring up to modA+2 to prevent fall; difficult to determine true extent of weakness vs. potential lack of effort. Pt pleasant and agreeable. Unable to get reliable pulse ox reading; pt without apparent distress or SOB on 6L O2 [place], productive cough. Pt would benefit from continued acute PT services to maximize functional mobility and independence prior to d/c with HHPT services.    Follow Up Recommendations Home health PT;Supervision/Assistance - 24 hour    Equipment Recommendations   (TBD)    Recommendations for Other Services       Precautions / Restrictions Precautions Precautions: Fall Restrictions Weight Bearing Restrictions: No      Mobility  Bed Mobility Overal bed mobility: Needs Assistance Bed Mobility: Supine to Sit     Supine to sit: Min assist;HOB elevated     General bed mobility comments: MinA for HHA to elevate trunk and scoot to EOB  Transfers Overall transfer level: Needs assistance Equipment used: 1 person hand held assist;2 person hand held  assist Transfers: Sit to/from Stand Sit to Stand: Min assist Stand pivot transfers: Mod assist;+2 physical assistance;+2 safety/equipment       General transfer comment: Min A for sit<>stand to initating power up and provide tactile cues. Cues for upright posture.  Ambulation/Gait Ambulation/Gait assistance: Mod assist;+2 safety/equipment Gait Distance (Feet): 1 Feet Assistive device: 1 person hand held assist;2 person hand held assist Gait Pattern/deviations: Step-to pattern;Trunk flexed;Leaning posteriorly Gait velocity: Decreased   General Gait Details: ModA+2 (safety) for pivotal steps to recliner; pt with bilateral knee buckling and instability, cues for sequencing and upright posture; difficult to determine weakness vs. lack of effort (per sister) vs. cognitive delay  Stairs            Wheelchair Mobility    Modified Rankin (Stroke Patients Only)       Balance Overall balance assessment: Needs assistance Sitting-balance support: No upper extremity supported;Feet supported Sitting balance-Leahy Scale: Fair Sitting balance - Comments: Assist to don bilateral shoes   Standing balance support: Bilateral upper extremity supported;During functional activity Standing balance-Leahy Scale: Poor Standing balance comment: Reliant on external assist                             Pertinent Vitals/Pain Pain Assessment: Faces Faces Pain Scale: No hurt Pain Intervention(s): Monitored during session    Home Living Family/patient expects to be discharged to:: Private residence Living Arrangements: Other relatives Available Help at Discharge: Family;Available 24 hours/day Type of Home: House Home Access: Stairs to enter Entrance Stairs-Rails: Right Entrance Stairs-Number of Steps: 5 Home Layout: One level Home Equipment: Bedside commode Additional Comments: Sister has been pt's caregiver since they were children; works at  [employer], when sister at work, her  daughter provides 24/7    Prior Function Level of Independence: Needs assistance   Gait / Transfers Assistance Needed: 24/7 supervision from sister or niece. Independent ambulation. Enjoys watching tv, drawing, playing outside  ADL's / Homemaking Assistance Needed: Sister assists with bathing set-up (to pour correct amount of shampoo, etc.), but pt able to bathe self and indep with most ADLs        Hand Dominance        Extremity/Trunk Assessment   Upper Extremity Assessment Upper Extremity Assessment: Generalized weakness    Lower Extremity Assessment Lower Extremity Assessment: Generalized weakness    Cervical / Trunk Assessment Cervical / Trunk Assessment: Other exceptions Cervical / Trunk Exceptions: forward flexed posture  Communication   Communication: Expressive difficulties  Cognition Arousal/Alertness: Awake/alert Behavior During Therapy: Flat affect Overall Cognitive Status: History of cognitive impairments - at baseline                                 General Comments: H/o Down's Syndrome. Minimal verbalizations this session, difficult to assess      General Comments General comments (skin integrity, edema, etc.): Unable to get reliable pulse ox reading, pt on 6L O2 [place] without significant SOB; productive cough, encouraged more of this. Sister present and supportive, oftentimes speaking for pt; educ sister on "COVID fog" and allowing pt increased time to respond/for tasks    Exercises Other Exercises Other Exercises: Educ pt and sister on seated LE therex and assist sit<>stands   Assessment/Plan    PT Assessment Patient needs continued PT services  PT Problem List Decreased strength;Decreased activity tolerance;Decreased balance;Decreased mobility;Decreased cognition;Decreased knowledge of use of DME;Decreased safety awareness;Cardiopulmonary status limiting activity       PT Treatment Interventions DME instruction;Gait training;Stair  training;Functional mobility training;Therapeutic activities;Therapeutic exercise;Balance training;Patient/family education;Cognitive remediation    PT Goals (Current goals can be found in the Care Plan section)  Acute Rehab PT Goals Patient Stated Goal: Return home with continued 24/7 family support PT Goal Formulation: With family Time For Goal Achievement: 05/25/20 Potential to Achieve Goals: Good    Frequency Min 3X/week   Barriers to discharge        Co-evaluation               AM-PAC PT "6 Clicks" Mobility  Outcome Measure Help needed turning from your back to your side while in a flat bed without using bedrails?: A Little Help needed moving from lying on your back to sitting on the side of a flat bed without using bedrails?: A Little Help needed moving to and from a bed to a chair (including a wheelchair)?: A Lot Help needed standing up from a chair using your arms (e.g., wheelchair or bedside chair)?: A Little Help needed to walk in hospital room?: A Lot Help needed climbing 3-5 steps with a railing? : A Lot 6 Click Score: 15    End of Session Equipment Utilized During Treatment: Oxygen Activity Tolerance: Patient tolerated treatment well;Patient limited by fatigue Patient left: in chair;with call bell/phone within reach;with family/visitor present Nurse Communication: Mobility status PT Visit Diagnosis: Other abnormalities of gait and mobility (R26.89);Muscle weakness (generalized) (M62.81)    Time: 9476-5465 PT Time Calculation (min) (ACUTE ONLY): 26 min   Charges:   PT Evaluation $PT Eval Moderate Complexity: 1 Mod     Ina Homes, PT, DPT Acute Rehabilitation Services  Pager [phone]  Office [phone]  Malachy Chamber 05/11/2020, 10:53 AM

## 2020-05-11 NOTE — Progress Notes (Signed)
Occupational Therapy Treatment Patient Details Name: Kenneth [name] MRN: [id number] DOB: Nov 21, 1970 Today's Date: 05/11/2020    History of present illness Pt is a 49 y.o. male presenting 101021 with reported weakness per caregiver. Tested COVID-19 positive; not vaccinated. PMH includes Down syndrome, seizures, congenital heart defect, acute pulmonary edema, osteoarthritis, avascular necrosis, bilateral cataracts, agitation, and hyperlipidemia.   OT comments  PTA, pt was living with his sister and was independent with BADLs. Currently, pt requires Min A for UB ADLs, Min A for LB ADLs, and Mod A +2 for functional transfers. Pt currently requiring presenting with decreased balance, strength, processing, and activity tolerance. Sister present throughout and providing home information; very supportive. Pt would benefit from further acute OT to facilitate safe dc. Recommend dc to home with HHOT for further OT to optimize safety, independence with ADLs, and return to PLOF.    Follow Up Recommendations  Home health OT;Supervision/Assistance - 24 hour    Equipment Recommendations  None recommended by OT    Recommendations for Other Services PT consult    Precautions / Restrictions Precautions Precautions: Fall Restrictions Weight Bearing Restrictions: No       Mobility Bed Mobility Overal bed mobility: Needs Assistance Bed Mobility: Supine to Sit     Supine to sit: Min assist;HOB elevated     General bed mobility comments: MinA for HHA to elevate trunk and scoot to EOB  Transfers Overall transfer level: Needs assistance Equipment used: 1 person hand held assist;2 person hand held assist Transfers: Sit to/from [employer] Sit to Stand: Min assist Stand pivot transfers: Mod assist;+2 physical assistance;+2 safety/equipment       General transfer comment: Min A for sit<>stand to initating power up and provide tactile cues. Cues for upright posture. Mod A +2 for  maintaining balance in standing. Noting bilateral knee buckling in during pivot; unsure of from fatigue vs weakness vs behavior.    Balance Overall balance assessment: Needs assistance Sitting-balance support: No upper extremity supported;Feet supported Sitting balance-Leahy Scale: Fair     Standing balance support: Bilateral upper extremity supported;During functional activity Standing balance-Leahy Scale: Poor Standing balance comment: Reliant on physical A                           ADL either performed or assessed with clinical judgement   ADL Overall ADL's : Needs assistance/impaired Eating/Feeding: Set up;Sitting Eating/Feeding Details (indicate cue type and reason): Slow and requiring increased time. Sister providing cues throughout Grooming: Minimal assistance;Sitting   Upper Body Bathing: Minimal assistance;Sitting   Lower Body Bathing: Minimal assistance;Sit to/from stand;+2 for physical assistance;+2 for safety/equipment   Upper Body Dressing : Minimal assistance;Sitting   Lower Body Dressing: Minimal assistance;Sit to/from stand Lower Body Dressing Details (indicate cue type and reason): Pt donning shoes with Min A to adjust tongue of shoe and then tye shoes. Pt using figure four method Toilet Transfer: Moderate assistance;+2 for physical assistance;+2 for safety/equipment;Stand-pivot (simulated to recliner) Toilet Transfer Details (indicate cue type and reason): Mod A +2 to maintain balance and present stumbling. Noting bilateral knees buckling but pt able to correct         Functional mobility during ADLs: Moderate assistance;+2 for physical assistance;+2 for safety/equipment (stand pivot only) General ADL Comments: Pt presenting with decreased balance, strength, processing, and activity tolerance.      Vision Baseline Vision/History: Wears glasses Patient Visual Report: No change from baseline Additional Comments: Will need to further assess. Noting  dysconjugate gaze. Unsure if baseline   Perception     Praxis      Cognition Arousal/Alertness: Awake/alert Behavior During Therapy: Flat affect Overall Cognitive Status: History of cognitive impairments - at baseline                                 General Comments: H/o Down's Syndrome. Minimal verbalizations this session, difficult to assess        Exercises     Shoulder Instructions       General Comments 6L O2. Poor pleth line for Spo2. No noted shortness of breath or distress    Pertinent Vitals/ Pain       Pain Assessment: Faces Faces Pain Scale: No hurt Pain Intervention(s): Monitored during session  Home Living Family/patient expects to be discharged to:: Private residence Living Arrangements: Other relatives Available Help at Discharge: Family;Available 24 hours/day Type of Home: House Home Access: Stairs to enter [employer] of Steps: 5 Entrance Stairs-Rails: Right Home Layout: One level     Bathroom Shower/Tub: Chief Strategy Officer: Handicapped height     Home Equipment: Bedside commode   Additional Comments: Sister has been pt's caregiver since they were children; works at [employer], when sister at work, her daughter provides 24/7      Prior Functioning/Environment Level of Independence: Needs assistance  Gait / Transfers Assistance Needed: 24/7 supervision from sister or niece. Independent ambulation. Enjoys watching tv, drawing, playing outside ADL's / Homemaking Assistance Needed: Sister assists with bathing set-up (to pour correct amount of shampoo, etc.), but pt able to bathe self and indep with most ADLs       Frequency  Min 2X/week        Progress Toward Goals  OT Goals(current goals can now be found in the care plan section)     Acute Rehab OT Goals Patient Stated Goal: Go home soon OT Goal Formulation: With patient Time For Goal Achievement: 05/25/20 Potential to Achieve Goals: Good   Plan      Co-evaluation                 AM-PAC OT "6 Clicks" Daily Activity     Outcome Measure   Help from another person eating meals?: A Little Help from another person taking care of personal grooming?: A Little Help from another person toileting, which includes using toliet, bedpan, or urinal?: A Lot Help from another person bathing (including washing, rinsing, drying)?: A Little Help from another person to put on and taking off regular upper body clothing?: A Little Help from another person to put on and taking off regular lower body clothing?: A Little 6 Click Score: 17    End of Session Equipment Utilized During Treatment: Oxygen (6L via [place])  OT Visit Diagnosis: Unsteadiness on feet (R26.81);Other abnormalities of gait and mobility (R26.89);Muscle weakness (generalized) (M62.81)   Activity Tolerance Patient limited by fatigue   Patient Left in chair;with call bell/phone within reach;with family/visitor present   Nurse Communication          Time: 5397-6734 OT Time Calculation (min): 28 min  Charges: OT General Charges $OT Visit: 1 Visit OT Evaluation $OT Eval Moderate Complexity: 1 Mod  Palmina Clodfelter MSOT, OTR/L Acute Rehab Pager: [phone] Office: [phone]   Theodoro Grist Lakiyah Arntson 05/11/2020, 10:26 AM

## 2020-05-11 NOTE — Progress Notes (Signed)
PROGRESS NOTE                                                                                                                                                                                                             Patient Demographics:    Kenneth [name], is a 49 y.o. male, DOB - 1970/12/17, ZOX:[id number]  Outpatient Primary MD for the patient is Eustaquio Boyden, MD    LOS - 2  Admit date - 05/09/2020    Chief Complaint  Patient presents with  . hypoxia       Brief Narrative - Kenneth [name] is a 49 y.o. male with medical history significant for abdominal discomfort and generalized fatigue, in the ER he was diagnosed with acute hypoxic respiratory failure due to COVID-19 pneumonia and admitted to the hospital.  Unfortunately he is not vaccinated.      Subjective:   Patient in bed, appears comfortable, denies any headache, no fever, no chest pain or pressure, no shortness of breath , no abdominal pain. No focal weakness.   Assessment  & Plan :     1. Acute Hypoxic Resp. Failure due to Acute Covid 19 Viral Pneumonitis + PE - he is unfortunately unvaccinated and has incurred moderate parenchymal lung injury, he has been started on combination of IV steroids and Remdesivir, POA sister has consented for Actemra use if any further clinical decline, will monitor closely if needed Actemra will be used. Full dose Lovenox for now.  Encouraged the patient to sit up in chair in the daytime use I-S and flutter valve for pulmonary toiletry and then prone in bed when at night.  Will advance activity and titrate down oxygen as possible.  Actemra/Baricitinib  off label use - patient/POA - sister Cala Bradford-  was told that if COVID-19 pneumonitis gets worse we might potentially use Actemra off label, patient denies any known history of active diverticulitis, tuberculosis or hepatitis, understands the risks and benefits and wants to  proceed with Actemra treatment if required.    SpO2: 92 % O2 Flow Rate (L/min): 6 L/min  Recent Labs  Lab 05/09/20 1651 05/09/20 1731 05/09/20 1740 05/10/20 0439 05/10/20 0453 05/11/20 0310  WBC 2.9*  --   --  2.1*  --  2.9*  HGB 12.7*  --   --  12.7*  --  13.7  HCT 39.2  --   --  38.3*  --  41.0  PLT 170  --   --  224  --  290  CRP  --   --   --  14.0*  --  10.3*  BNP  --   --   --  104.3*  --  112.2*  DDIMER  --   --   --  7.73*  --  5.46*  PROCALCITON  --   --   --   --  <0.10 <0.10  AST 62*  --   --  39  --  66*  ALT 37  --   --  37  --  72*  ALKPHOS 95  --   --  102  --  97  BILITOT 1.1  --   --  0.6  --  0.5  ALBUMIN 2.6*  --   --  2.6*  --  2.7*  LATICACIDVEN  --   --  1.5  --   --   --   SARSCOV2NAA  --  POSITIVE*  --   --   --   --     2.  Urinary retention.  Has condom catheter, had 500 cc on bladder scan, after encouragement was able to clear his bladder, will place on Flomax and monitor.  3.  Seizures.  On phenobarbital.  4.  Dyslipidemia.  On statin.  5.  History of Down syndrome.  Supportive care.  6.  Acute PE.  Due to COVID-19 inflammation related hypercoagulability, full dose Lovenox.      Condition - Extremely Guarded  Family Communication  : sister Cala BradfordKimberly  [phone]) POA bediside 05/10/20, 05/11/20  Code Status :  Full  Consults  :  None  Procedures  :  None  PUD Prophylaxis : None  Disposition Plan  :    Status is: Inpatient  Remains inpatient appropriate because:IV treatments appropriate due to intensity of illness or inability to take PO   Dispo: The patient is from: Home              Anticipated d/c is to: Home              Anticipated d/c date is: > 3 days              Patient currently is not medically stable to d/c.   DVT Prophylaxis  :  Lovenox   Lab Results  Component Value Date   PLT 290 05/11/2020    Diet :  Diet Order            Diet regular Room service appropriate? Yes; Fluid consistency: Thin  Diet  effective now                  Inpatient Medications  Scheduled Meds: . enoxaparin (LOVENOX) injection  70 mg Subcutaneous Q12H  . influenza vac split quadrivalent PF  0.5 mL Intramuscular Tomorrow-1000  . magnesium hydroxide  30 mL Oral BID  . methylPREDNISolone (SOLU-MEDROL) injection  60 mg Intravenous BID  . multivitamin with minerals  1 tablet Oral Daily  . phenobarbital  259.2 mg Oral QHS  . pravastatin  10 mg Oral Daily  . QUEtiapine  300 mg Oral QHS  . sodium polystyrene  30 g Oral Once  . sodium zirconium cyclosilicate  10 g Oral BID  . vitamin B-12  500 mcg Oral Q breakfast   Continuous Infusions: . sodium chloride Stopped (05/10/20 0150)  . remdesivir 100  mg in NS 100 mL     PRN Meds:.sodium chloride, lip balm  Antibiotics  :    Anti-infectives (From admission, onward)   Start     Dose/Rate Route Frequency Ordered Stop   05/11/20 1000  remdesivir 100 mg in sodium chloride 0.9 % 100 mL IVPB       "Followed by" Linked Group Details   100 mg 200 mL/hr over 30 Minutes Intravenous Daily 05/09/20 2130 05/14/20 0959   05/10/20 1800  remdesivir 100 mg in sodium chloride 0.9 % 100 mL IVPB       "Followed by" Linked Group Details   100 mg 200 mL/hr over 30 Minutes Intravenous Daily-1800 05/09/20 2130 05/10/20 1802   05/10/20 1000  remdesivir 100 mg in sodium chloride 0.9 % 100 mL IVPB  Status:  Discontinued       "Followed by" Linked Group Details   100 mg 200 mL/hr over 30 Minutes Intravenous Daily 05/09/20 2150 05/09/20 2208   05/09/20 2330  azithromycin (ZITHROMAX) 500 mg in sodium chloride 0.9 % 250 mL IVPB  Status:  Discontinued        500 mg 250 mL/hr over 60 Minutes Intravenous Every 24 hours 05/09/20 2150 05/10/20 0712   05/09/20 2300  cefTRIAXone (ROCEPHIN) 1 g in sodium chloride 0.9 % 100 mL IVPB  Status:  Discontinued        1 g 200 mL/hr over 30 Minutes Intravenous Every 24 hours 05/09/20 2150 05/10/20 0272   05/09/20 2230  remdesivir 200 mg in sodium  chloride 0.9% 250 mL IVPB       "Followed by" Linked Group Details   200 mg 580 mL/hr over 30 Minutes Intravenous Once 05/09/20 2130 05/10/20 0017   05/09/20 2150  remdesivir 200 mg in sodium chloride 0.9% 250 mL IVPB  Status:  Discontinued       "Followed by" Linked Group Details   200 mg 580 mL/hr over 30 Minutes Intravenous Once 05/09/20 2150 05/09/20 2208       Time Spent in minutes  30   Susa Raring M.D on 05/11/2020 at 10:20 AM  To page go to [email or web address] - password [facility]  [facility] -  Office  [phone]   See all Orders from today for further details    Objective:   Vitals:   05/10/20 1628 05/10/20 2052 05/11/20 0441 05/11/20 0727  BP: 128/82 121/83 118/83 108/86  Pulse: 95 80 65 79  Resp: 18 15 20 13   Temp: 98.6 F (37 C) 98.2 F (36.8 C) 98.2 F (36.8 C) 97.7 F (36.5 C)  TempSrc: Axillary Axillary Axillary Oral  SpO2: (!) 86% 96% 94% 92%  Weight:      Height:        Wt Readings from Last 3 Encounters:  05/09/20 74 kg  05/09/20 74.8 kg  01/29/20 74.2 kg     Intake/Output Summary (Last 24 hours) at 05/11/2020 1020 Last data filed at 05/10/2020 2059 Gross per 24 hour  Intake 1036.66 ml  Output 400 ml  Net 636.66 ml     Physical Exam  Awake Alert, No new F.N deficits,   [place].AT,PERRAL Supple Neck,No JVD, No cervical lymphadenopathy appriciated.  Symmetrical Chest wall movement, Good air movement bilaterally, CTAB RRR,No Gallops, Rubs or new Murmurs, No Parasternal Heave +ve B.Sounds, Abd Soft, No tenderness, No organomegaly appriciated, No rebound - guarding or rigidity. No Cyanosis, Clubbing or edema, No new Rash or bruise     Data Review:    CBC Recent  Labs  Lab 05/09/20 1651 05/10/20 0439 05/11/20 0310  WBC 2.9* 2.1* 2.9*  HGB 12.7* 12.7* 13.7  HCT 39.2 38.3* 41.0  PLT 170 224 290  MCV 102.6* 100.0 101.0*  MCH 33.2 33.2 33.7  MCHC 32.4 33.2 33.4  RDW 15.7* 15.1 14.9  LYMPHSABS 1.0 0.6* 0.9  MONOABS 0.5 0.2 0.2   EOSABS 0.0 0.0 0.0  BASOSABS 0.0 0.0 0.0    Recent Labs  Lab 05/09/20 1651 05/09/20 1740 05/10/20 0439 05/10/20 0453 05/11/20 0310  NA 139  --  142  --  139  K 5.0  --  4.1  --  5.2*  CL 103  --  105  --  103  CO2 28  --  26  --  26  GLUCOSE 85  --  126*  --  140*  BUN 10  --  8  --  12  CREATININE 1.13  --  0.95  --  0.99  CALCIUM 8.0*  --  8.3*  --  8.6*  AST 62*  --  39  --  66*  ALT 37  --  37  --  72*  ALKPHOS 95  --  102  --  97  BILITOT 1.1  --  0.6  --  0.5  ALBUMIN 2.6*  --  2.6*  --  2.7*  MG  --   --  2.3  --  2.4  CRP  --   --  14.0*  --  10.3*  DDIMER  --   --  7.73*  --  5.46*  PROCALCITON  --   --   --  <0.10 <0.10  LATICACIDVEN  --  1.5  --   --   --   BNP  --   --  104.3*  --  112.2*    ------------------------------------------------------------------------------------------------------------------ No results for input(s): CHOL, HDL, LDLCALC, TRIG, CHOLHDL, LDLDIRECT in the last 72 hours.  No results found for: HGBA1C ------------------------------------------------------------------------------------------------------------------ No results for input(s): TSH, T4TOTAL, T3FREE, THYROIDAB in the last 72 hours.  Invalid input(s): FREET3  Cardiac Enzymes No results for input(s): CKMB, TROPONINI, MYOGLOBIN in the last 168 hours.  Invalid input(s): CK ------------------------------------------------------------------------------------------------------------------    Component Value Date/Time   BNP 112.2 (H) 05/11/2020 0310    Micro Results Recent Results (from the past 240 hour(s))  Respiratory Panel by RT PCR (Flu A&B, Covid) - Nasopharyngeal Swab     Status: Abnormal   Collection Time: 05/09/20  5:31 PM   Specimen: Nasopharyngeal Swab  Result Value Ref Range Status   SARS Coronavirus 2 by RT PCR POSITIVE (A) NEGATIVE Final    Comment: RESULT CALLED TO, READ BACK BY AND VERIFIED WITH: RN D SIDBURY [id number] 1928 MLM (NOTE) SARS-CoV-2 target  nucleic acids are DETECTED.  SARS-CoV-2 RNA is generally detectable in upper respiratory specimens  during the acute phase of infection. Positive results are indicative of the presence of the identified virus, but do not rule out bacterial infection or co-infection with other pathogens not detected by the test. Clinical correlation with patient history and other diagnostic information is necessary to determine patient infection status. The expected result is Negative.  Fact Sheet for Patients:  https://www.moore.com/  Fact Sheet for Healthcare Providers: https://www.young.biz/  This test is not yet approved or cleared by the [place] FDA and  has been authorized for detection and/or diagnosis of SARS-CoV-2 by FDA under an Emergency Use Authorization (EUA).  This EUA will remain in effect (meaning this test can be used)  for the duration of  the COVID-19 declaration under Section 564(b)(1) of the Act, 21 U.S.C. section 360bbb-3(b)(1), unless the authorization is terminated or revoked sooner.      Influenza A by PCR NEGATIVE NEGATIVE Final   Influenza B by PCR NEGATIVE NEGATIVE Final    Comment: (NOTE) The Xpert Xpress SARS-CoV-2/FLU/RSV assay is intended as an aid in  the diagnosis of influenza from Nasopharyngeal swab specimens and  should not be used as a sole basis for treatment. Nasal washings and  aspirates are unacceptable for Xpert Xpress SARS-CoV-2/FLU/RSV  testing.  Fact Sheet for Patients: https://www.moore.com/  Fact Sheet for Healthcare Providers: https://www.young.biz/  This test is not yet approved or cleared by the [place] FDA and  has been authorized for detection and/or diagnosis of SARS-CoV-2 by  FDA under an Emergency Use Authorization (EUA). This EUA will remain  in effect (meaning this test can be used) for the duration of the  Covid-19 declaration under Section  564(b)(1) of the Act, 21  U.S.C. section 360bbb-3(b)(1), unless the authorization is  terminated or revoked. Performed at [facility], [address] [place], [place] [address]     Radiology Reports CT ANGIO CHEST PE W OR WO CONTRAST  Result Date: 05/10/2020 CLINICAL DATA:  49 year old male with history of shortness of breath. Suspected pulmonary embolism. EXAM: CT ANGIOGRAPHY CHEST WITH CONTRAST TECHNIQUE: Multidetector CT imaging of the chest was performed using the standard protocol during bolus administration of intravenous contrast. Multiplanar CT image reconstructions and MIPs were obtained to evaluate the vascular anatomy. CONTRAST:  76mL OMNIPAQUE IOHEXOL 350 MG/ML SOLN COMPARISON:  Chest CTA 10/20/2012. FINDINGS: Cardiovascular: Multiple filling defects are noted within the pulmonary arterial tree bilaterally, indicative of widespread pulmonary emboli. This includes distal main, lobar, segmental and subsegmental sized branches in the lungs bilaterally, most of which are nonocclusive, but there are several occlusive filling defects as well. Heart size is borderline enlarged. There is no significant pericardial fluid, thickening or pericardial calcification. No atherosclerotic calcifications in the thoracic aorta or the coronary arteries. Mitral annular calcifications. Mediastinum/Nodes: No pathologically enlarged mediastinal or hilar lymph nodes. Patulous esophagus. No axillary lymphadenopathy. Lungs/Pleura: Patchy multifocal areas of ground-glass attenuation and interstitial prominence are noted throughout the lungs bilaterally, most evident in a peribronchovascular distribution, most severe throughout the mid to upper lungs. No definite pleural effusions. Upper Abdomen: Unremarkable. Musculoskeletal: There are no aggressive appearing lytic or blastic lesions noted in the visualized portions of the skeleton. Review of the MIP images confirms the above findings. IMPRESSION: 1. Extensive  bilateral main, lobar, segmental and subsegmental sized pulmonary emboli. 2. The appearance of the lungs is most suggestive of severe multilobar bilateral pneumonia, presumably from reported COVID-19 infection. However, the possibility that one or more of these areas could represent alveolar hemorrhage from underlying pulmonary infarction is not excluded. Critical Value/emergent results were called by telephone at the time of interpretation on 05/10/2020 at 2:42 pm to provider [facility], who verbally acknowledged these results. Electronically Signed   By: Trudie Reed M.D.   On: 05/10/2020 14:46   DG Chest Portable 1 View  Result Date: 05/09/2020 CLINICAL DATA:  49 year old male with lethargy. EXAM: PORTABLE CHEST 1 VIEW COMPARISON:  Chest radiograph dated 07/27/2017. FINDINGS: Bilateral confluent airspace opacities most consistent with multifocal pneumonia, likely viral or atypical in etiology including COVID-19. Clinical correlation and follow-up recommended. No pleural effusion or pneumothorax. Stable cardiac silhouette. No acute osseous pathology. IMPRESSION: Multifocal pneumonia. Electronically Signed   By: Burtis Junes  Radparvar M.D.   On: 05/09/2020 18:24

## 2020-05-11 NOTE — Progress Notes (Addendum)
Attempted lower extremity venous duplex, however patient is still in the chair, despite calling ahead to the RN to arrange for patient to be in bed for exam. Will attempt again as schedule permits.  05/11/2020 11:54 AM Eula Fried., MHA, RVT, RDCS, RDMS

## 2020-05-12 ENCOUNTER — Inpatient Hospital Stay (HOSPITAL_COMMUNITY): Payer: 59

## 2020-05-12 ENCOUNTER — Other Ambulatory Visit (HOSPITAL_COMMUNITY): Payer: Self-pay | Admitting: Internal Medicine

## 2020-05-12 DIAGNOSIS — R7989 Other specified abnormal findings of blood chemistry: Secondary | ICD-10-CM | POA: Diagnosis not present

## 2020-05-12 LAB — MAGNESIUM: Magnesium: 2.3 mg/dL (ref 1.7–2.4)

## 2020-05-12 LAB — CBC WITH DIFFERENTIAL/PLATELET
Abs Immature Granulocytes: 0.1 10*3/uL — ABNORMAL HIGH (ref 0.00–0.07)
Basophils Absolute: 0 10*3/uL (ref 0.0–0.1)
Basophils Relative: 1 %
Eosinophils Absolute: 0 10*3/uL (ref 0.0–0.5)
Eosinophils Relative: 0 %
HCT: 38.6 % — ABNORMAL LOW (ref 39.0–52.0)
Hemoglobin: 13 g/dL (ref 13.0–17.0)
Immature Granulocytes: 2 %
Lymphocytes Relative: 37 %
Lymphs Abs: 2 10*3/uL (ref 0.7–4.0)
MCH: 34.2 pg — ABNORMAL HIGH (ref 26.0–34.0)
MCHC: 33.7 g/dL (ref 30.0–36.0)
MCV: 101.6 fL — ABNORMAL HIGH (ref 80.0–100.0)
Monocytes Absolute: 0.7 10*3/uL (ref 0.1–1.0)
Monocytes Relative: 12 %
Neutro Abs: 2.6 10*3/uL (ref 1.7–7.7)
Neutrophils Relative %: 48 %
Platelets: 326 10*3/uL (ref 150–400)
RBC: 3.8 MIL/uL — ABNORMAL LOW (ref 4.22–5.81)
RDW: 15 % (ref 11.5–15.5)
WBC: 5.4 10*3/uL (ref 4.0–10.5)
nRBC: 0 % (ref 0.0–0.2)

## 2020-05-12 LAB — COMPREHENSIVE METABOLIC PANEL
ALT: 61 U/L — ABNORMAL HIGH (ref 0–44)
AST: 38 U/L (ref 15–41)
Albumin: 2.6 g/dL — ABNORMAL LOW (ref 3.5–5.0)
Alkaline Phosphatase: 83 U/L (ref 38–126)
Anion gap: 10 (ref 5–15)
BUN: 17 mg/dL (ref 6–20)
CO2: 31 mmol/L (ref 22–32)
Calcium: 8.4 mg/dL — ABNORMAL LOW (ref 8.9–10.3)
Chloride: 100 mmol/L (ref 98–111)
Creatinine, Ser: 1 mg/dL (ref 0.61–1.24)
GFR, Estimated: >60 mL/min (ref >60)
Glucose, Bld: 119 mg/dL — ABNORMAL HIGH (ref 70–99)
Potassium: 4.2 mmol/L (ref 3.5–5.1)
Sodium: 141 mmol/L (ref 135–145)
Total Bilirubin: 0.4 mg/dL (ref 0.3–1.2)
Total Protein: 6.3 g/dL — ABNORMAL LOW (ref 6.5–8.1)

## 2020-05-12 LAB — BRAIN NATRIURETIC PEPTIDE: B Natriuretic Peptide: 37.4 pg/mL (ref 0.0–100.0)

## 2020-05-12 LAB — PROCALCITONIN: Procalcitonin: <0.1 ng/mL

## 2020-05-12 LAB — D-DIMER, QUANTITATIVE: D-Dimer, Quant: 3.63 ug/mL-FEU — ABNORMAL HIGH (ref 0.00–0.50)

## 2020-05-12 LAB — C-REACTIVE PROTEIN: CRP: 5.3 mg/dL — ABNORMAL HIGH (ref <1.0)

## 2020-05-12 MED ORDER — ENOXAPARIN SODIUM 80 MG/0.8ML ~~LOC~~ SOLN: 70.0000 mg | Freq: Two times a day (BID) | SUBCUTANEOUS | 0 refills | Status: DC

## 2020-05-12 MED ORDER — GUAIFENESIN-DM 100-10 MG/5ML PO SYRP: 5.0000 mL | ORAL_SOLUTION | ORAL | Status: DC | PRN

## 2020-05-12 MED ORDER — GABAPENTIN 100 MG PO CAPS
200.0000 mg | ORAL_CAPSULE | Freq: Every day | ORAL | Status: DC
  Administered 2020-05-13 – 2020-05-14 (×2): 200 mg via ORAL
  Filled 2020-05-12 (×2): qty 2

## 2020-05-12 MED ORDER — ACETAMINOPHEN 325 MG PO TABS
650.0000 mg | ORAL_TABLET | Freq: Four times a day (QID) | ORAL | Status: DC | PRN
  Filled 2020-05-12: qty 2

## 2020-05-12 MED ORDER — GABAPENTIN 300 MG PO CAPS
300.0000 mg | ORAL_CAPSULE | Freq: Every day | ORAL | Status: DC
  Administered 2020-05-13: 300 mg via ORAL
  Filled 2020-05-12: qty 1

## 2020-05-12 MED FILL — ENOXAPARIN SODIUM 80 MG/0.8: 80 | 90 days supply | Qty: 144 | Fill #0

## 2020-05-12 NOTE — Progress Notes (Signed)
Bilateral lower extremity venous duplex has been completed. Preliminary results can be found in CV Proc through chart review.  Results were given to the patient's nurse, Toniann Fail.  05/12/20 12:29 PM Olen Cordial RVT

## 2020-05-12 NOTE — Progress Notes (Signed)
Physical Therapy Treatment Patient Details Name: Kenneth [name] MRN: [id number] DOB: 1971-02-04 Today's Date: 05/12/2020    History of Present Illness Pt is a 49 y.o. male presenting 101021 with reported weakness per caregiver. Tested COVID-19 positive; not vaccinated. CT angio with extensive bilateral PE. PMH includes Down syndrome, seizures, congenital heart defect, acute pulmonary edema, osteoarthritis, avascular necrosis, bilateral cataracts, agitation, HLD.   PT Comments    Pt progressing well with mobility; increased participation this session with max encouragement to mobilize as independently as possible. Today's session focused on transfer and gait training, pt requiring up to minA for stability. Remains limited by generalized weakness, decreased activity tolerance and slowed processing. Sister Kenneth [name]) present and reports cognition and mobility not back to baseline. Will continue to follow acutely.  SpO2 >/90% on RA when pleth reliable; pt without significant SOB/DOE noted    Follow Up Recommendations  Home health PT;Supervision/Assistance - 24 hour     Equipment Recommendations   (TBD)    Recommendations for Other Services       Precautions / Restrictions Precautions Precautions: Fall Restrictions Weight Bearing Restrictions: No    Mobility  Bed Mobility               General bed mobility comments: Received sitting in recliner  Transfers Overall transfer level: Needs assistance Equipment used: None Transfers: Sit to/from Stand Sit to Stand: Min assist;Min guard         General transfer comment: Multiple sit<>stands from recliner and BSC, frequent cues for hand placement, initial minA to elevate trunk, progressing to min guard on subsequent trials; increased time and effort  Ambulation/Gait Ambulation/Gait assistance: Min assist;Min guard Gait Distance (Feet): 40 Feet Assistive device: None;1 person hand held assist Gait Pattern/deviations: Step-through  pattern;Shuffle;Trunk flexed Gait velocity: Decreased   General Gait Details: Slow, shuffling steps throughout room with initial minA for HHA, progressing to min guard and pt intermittent reaching for UE support on furniture, frequent cues to not hold onto things; cues to increase step length and stand upright, pt with difficulty doing this (pt reports shuffling/forward flexed gait is baseline)   Stairs             Wheelchair Mobility    Modified Rankin (Stroke Patients Only)       Balance Overall balance assessment: Needs assistance Sitting-balance support: No upper extremity supported;Feet supported Sitting balance-Leahy Scale: Good Sitting balance - Comments: Increased time and effort to initiate anterior weight translation to edge of recliner, once pt able to do this, seated balance good and able to don bilateral socks with figure-4 technique     Standing balance-Leahy Scale: Fair Standing balance comment: Can static stand and take steps without UE support, min guard; able to perform posterior pericare without UE support after using BSC                            Cognition Arousal/Alertness: Awake/alert Behavior During Therapy: Flat affect Overall Cognitive Status: History of cognitive impairments - at baseline                                 General Comments: H/o Down's Syndrome. Minimal verbalizations, but talking more throughout session and with encouragement. Frequent encouragement to complete tasks as independently as possible; following majority of one step commands well with increased time      Exercises  General Comments General comments (skin integrity, edema, etc.): Difficult getting consistent reliable pleth with pulse ox - SpO2 >/90% on RA when pleth reliable (pt asymptomatic without apparent increased WOB). Sister present and supportive      Pertinent Vitals/Pain Pain Assessment: Faces Faces Pain Scale: Hurts a little  bit Pain Location: Headache Pain Descriptors / Indicators: Headache Pain Intervention(s): Monitored during session    Home Living                      Prior Function            PT Goals (current goals can now be found in the care plan section) Progress towards PT goals: Progressing toward goals    Frequency    Min 3X/week      PT Plan Current plan remains appropriate    Co-evaluation              AM-PAC PT "6 Clicks" Mobility   Outcome Measure  Help needed turning from your back to your side while in a flat bed without using bedrails?: A Little Help needed moving from lying on your back to sitting on the side of a flat bed without using bedrails?: A Little Help needed moving to and from a bed to a chair (including a wheelchair)?: A Little Help needed standing up from a chair using your arms (e.g., wheelchair or bedside chair)?: A Little Help needed to walk in hospital room?: A Little Help needed climbing 3-5 steps with a railing? : A Lot 6 Click Score: 17    End of Session Equipment Utilized During Treatment: Gait belt Activity Tolerance: Patient tolerated treatment well Patient left: in chair;with call bell/phone within reach;with family/visitor present Nurse Communication: Mobility status PT Visit Diagnosis: Other abnormalities of gait and mobility (R26.89);Muscle weakness (generalized) (M62.81)     Time: 8366-2947 PT Time Calculation (min) (ACUTE ONLY): 30 min  Charges:  $Gait Training: 8-22 mins $Therapeutic Activity: 8-22 mins                     Ina Homes, PT, DPT Acute Rehabilitation Services  Pager [phone] Office [phone]  Malachy Chamber 05/12/2020, 11:22 AM

## 2020-05-12 NOTE — Progress Notes (Signed)
ANTICOAGULATION CONSULT NOTE - Initial Consult  Pharmacy Consult for Lovenox Indication: PE  Allergies  Allergen Reactions  . Amoxicillin-Pot Clavulanate Palpitations and Other (See Comments)    "Felt like I was having a heart attack." Has patient had a PCN reaction causing immediate rash, facial/tongue/throat swelling, SOB or lightheadedness with hypotension: No Has patient had a PCN reaction causing severe rash involving mucus membranes or skin necrosis: No Has patient had a PCN reaction that required hospitalization: No Has patient had a PCN reaction occurring within the last 10 years: No If all of the above answers are "NO", then [place] proceed with Cephalosporin use.  unknown  . Penicillins Itching, Rash and Palpitations    Has patient had a PCN reaction causing immediate rash, facial/tongue/throat swelling, SOB or lightheadedness with hypotension: No Has patient had a PCN reaction causing severe rash involving mucus membranes or skin necrosis: No Has patient had a PCN reaction that required hospitalization: Unknown Has patient had a PCN reaction occurring within the last 10 years: No If all of the above answers are "NO", then Sulewski proceed with Cephalosporin use.  "Felt like I was having a heart attack." Has patient had a PCN reaction causing immediate rash, facial/tongue/throat swelling, SOB or lightheadedness with hypotension: No Has patient had a PCN reaction causing severe rash involving mucus membranes or skin necrosis: No Has patient had a PCN reaction that required hospitalization: No Has patient had a PCN reaction occurring within the last 10 years: No If all of the above answers are "NO", then Plascencia proceed with Cephalosporin use. Has patient had a PCN reaction causing immediate rash, facial/tongue/throat swelling, SOB or lightheadedness with hypotension: No Has patient had a PCN reaction causing severe rash involving mucus membranes or skin necrosis: No Has patient had a PCN  reaction that required hospitalization: Unknown Has patient had a PCN reaction occurring within the last 10 years: No If all of the above answers are "NO", then Dib proceed with Cephalosporin use.    Patient Measurements: Height: 5\' 4"  (162.6 cm) Weight: 74 kg (163 lb 2.3 oz) IBW/kg (Calculated) : 59.2  Vital Signs: Temp: 98.2 F (36.8 C) (10/13 0636) Temp Source: Axillary (10/13 0636) BP: 119/84 (10/13 0636) Pulse Rate: 61 (10/13 0636)  Labs: Recent Labs    05/09/20 1651 05/09/20 1651 05/10/20 0439 05/10/20 0439 05/11/20 0310 05/12/20 0659  HGB 12.7*   < > 12.7*   < > 13.7 13.0  HCT 39.2   < > 38.3*  --  41.0 38.6*  PLT 170   < > 224  --  290 326  CREATININE 1.13   < > 0.95  --  0.99 1.00  TROPONINIHS 9  --   --   --   --   --    < > = values in this interval not displayed.    Estimated Creatinine Clearance: 83.2 mL/min (by C-G formula based on SCr of 1 mg/dL).   Medical History: Past Medical History:  Diagnosis Date  . Cataracts, bilateral   . Congenital heart defect   . COVID-19 virus infection 04/2020   COVID PNA s/p hospitalization  . Down syndrome   . HLD (hyperlipidemia)   . Seizure disorder (HCC)     Medications:  Medications Prior to Admission  Medication Sig Dispense Refill Last Dose  . gabapentin (NEURONTIN) 100 MG capsule TAKE 2 CAPSULES BY MOUTH IN THE MORNING AND 3 CAPSULES IN THE EVENING. (WHITE CAPSULE WITH D 02 OR SG 179) (Patient taking differently:  Take 200-300 mg by mouth See admin instructions. TAKE 2 CAPSULES BY MOUTH IN THE MORNING AND 3 CAPSULES IN THE EVENING. (WHITE CAPSULE WITH D 02 OR SG 179)) 155 capsule 11 05/09/2020 at Unknown time  . Ibuprofen 200 MG CAPS Take 400-600mg  twice daily with meals as needed for foot pain (Patient taking differently: Take 400-600 capsules by mouth See admin instructions. Take 400 mg tablet when pain starts, if pain continues take 600 mg)   05/09/2020 at Unknown time  . Multiple Vitamin (MULTIVITAMIN)  tablet Take 1 tablet by mouth daily.   05/09/2020 at Unknown time  . PHENobarbital (LUMINAL) 64.8 MG tablet TAKE FOUR (4) TABLETS BY MOUTH DAILY AT BEDTIME. (ROUND WHITE TABLET WITH E5 757) (Patient taking differently: Take 259.2 mg by mouth at bedtime. TAKE FOUR (4) TABLETS BY MOUTH DAILY AT BEDTIME. (ROUND WHITE TABLET WITH E5 757)) 124 tablet 5 05/08/2020 at Unknown time  . pravastatin (PRAVACHOL) 10 MG tablet Take 1 tablet (10 mg total) by mouth daily. 31 tablet 11 05/08/2020 at Unknown time  . QUEtiapine (SEROQUEL) 300 MG tablet TAKE 2 TABLETS BY MOUTH EVERY NIGHT AT BEDTIME (WHITE OBLONG TABLET WITH 259) (Patient taking differently: Take 600 mg by mouth at bedtime. ) 62 tablet 3 05/08/2020 at Unknown time  . vitamin B-12 (CYANOCOBALAMIN) 500 MCG tablet Take 500 mcg by mouth daily with breakfast.    05/09/2020 at Unknown time  . clotrimazole (LOTRIMIN AF) 1 % cream Apply 1 application topically 2 (two) times daily. (Patient not taking: Reported on 05/09/2020)   Not Taking at Unknown time  . diclofenac Sodium (VOLTAREN) 1 % GEL Apply 2 g topically 3 (three) times daily. (Patient not taking: Reported on 05/09/2020) 100 g 1 Not Taking at Unknown time    Assessment: 49 y.o. male with COVID PNA. He has a new PE with COVID. He has been on therapeutic lovenox since then. Plan was to change to PO apixaban tomorrow but unfortunately, he is on phenobarbital at home. It's not recommended to be given with NOAC due to its p-gp induction ability. D/w Dr. Thedore Mins and we will continue with therapeutic lovenox at discharge. His co-pay is $0 per the [facility] pharmacy.   Goal of Therapy:  Anti-Xa 0.6-1 Monitor platelets by anticoagulation protocol: Yes   Plan:  Cont Lovenox 70 mg SQ q12h   Ulyses Southward, PharmD, BCIDP, AAHIVP, CPP Infectious Disease Pharmacist 05/12/2020 11:28 AM

## 2020-05-12 NOTE — TOC Initial Note (Signed)
Transition of Care [facility]) - Initial/Assessment Note    Patient Details  Name: Kenneth [name] MRN: [id number] Date of Birth: 1971-05-17  [facility] CM/SW Contact:    Nance Pear, RN Phone Number: 05/12/2020, 3:20 PM  Clinical Narrative:                 Case manager spoke to, [place], sister about brother's health prior to hospitalization.  Sister says that prior to hospitalization that patient was able to shower, dress, and do ADLs by self, not able to cook or gage how much shampoo to use, was not using any DMEs prior to hospitalization.  Lives with sister in a double wide trailer, verified address and insurance.  Recommendations for home health PT/OT, sister said that if coming to home that would need to go to **[address] which is daughter's address** since other home has dogs in it.  Case manager has attempted multiple agencies for [facility] PT/OT with none as of yet accepting.  Possible agencies that have not said no yet are [facility] and [facility] whom said to try back.  TOC following for progression of care needs.  Expected Discharge Plan: Home w Home Health Services Barriers to Discharge: Continued Medical Work up   Patient Goals and CMS Choice   CMS Medicare.gov Compare Post Acute Care list provided to:: Legal Guardian Choice offered to / list presented to : [facility] POA / Guardian  Expected Discharge Plan and Services Expected Discharge Plan: Home w Home Health Services   Discharge Planning Services: CM Consult Post Acute Care Choice: Home Health Living arrangements for the past 2 months: Single Family Home                                      Prior Living Arrangements/Services Living arrangements for the past 2 months: Single Family Home Lives with:: Relatives Patient language and need for interpreter reviewed:: Yes Do you feel safe going back to the place where you live?: Yes      Need for Family Participation in Patient Care: Yes  (Comment) Care giver support system in place?: Yes (comment)   Criminal Activity/Legal Involvement Pertinent to Current Situation/Hospitalization: No - Comment as needed  Activities of Daily Living Home Assistive Devices/Equipment: Blood pressure cuff, Eyeglasses ADL Screening (condition at time of admission) Patient's cognitive ability adequate to safely complete daily activities?: No Is the patient deaf or have difficulty hearing?: No Does the patient have difficulty seeing, even when wearing glasses/contacts?: No Does the patient have difficulty concentrating, remembering, or making decisions?: Yes Patient able to express need for assistance with ADLs?: Yes Does the patient have difficulty dressing or bathing?: Yes Independently performs ADLs?: No Communication: Independent Dressing (OT): Independent Grooming: Independent Feeding: Independent Bathing: Needs assistance Is this a change from baseline?: Pre-admission baseline Toileting: Independent In/Out Bed: Independent Walks in Home: Independent Does the patient have difficulty walking or climbing stairs?: Yes Weakness of Legs: Both Weakness of Arms/Hands: None  Permission Sought/Granted Permission sought to share information with : Case Manager Permission granted to share information with : Yes, Verbal Permission Granted     Permission granted to share info w AGENCY: [facility], [facility], [facility], [facility], [facility]        Emotional Assessment       Orientation: : Oriented to Self, Oriented to Place, Oriented to  Time, Oriented to  Situation Alcohol / Substance Use: Not Applicable Psych Involvement: No (comment)  Admission diagnosis:  Acute hypoxemic respiratory failure due to severe acute respiratory syndrome coronavirus 2 (SARS-CoV-2) disease (HCC) [U07.1, J96.01] Pneumonia due to COVID-19 virus [U07.1, J12.82] Patient Active Problem List   Diagnosis Date Noted  . Acute hypoxemic respiratory  failure due to severe acute respiratory syndrome coronavirus 2 (SARS-CoV-2) disease (HCC) 05/09/2020  . Neutropenia (HCC) 05/09/2020  . Vaccine counseling 05/09/2020  . Agitation 01/29/2020  . Bilateral cataracts 07/28/2019  . Left foot pain 07/09/2019  . Pes planus 07/09/2019  . Tinea pedis 07/09/2019  . 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 Localization-related symptomatic epilepsy and epileptic syndromes with complex partial seizures, not intractable, without status epilepticus (HCC) 12/22/2016  . Hearing trouble, bilateral 11/16/2016  . 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 DDD (degenerative disc disease), lumbar 08/08/2016  . Constipation 08/08/2016  . Acute pulmonary edema (HCC) 12/15/2015  . Advanced care planning/counseling discussion 07/13/2014  . Medicare annual wellness visit, subsequent 07/11/2013  . Down syndrome   . Seizure disorder (HCC)   . Vitamin B12 deficiency 04/22/2010  . HLD (hyperlipidemia) 02/07/2007   PCP:  Eustaquio Boyden, MD Pharmacy:   [facility] - [place], [place] - [phone] [place] [address] Phone: [phone] Fax: [phone]  [facility] - [place], [place] - [address] [place] [address] Phone: [phone] Fax: [phone]  [facility] - [place], [place] - [address] Marye Round [place] [address] Phone: [phone] Fax: [phone]  [facility] - [place], [place] - [address] [place] [address] Phone: [phone] Fax: [phone]     Social Determinants of Health (SDOH) Interventions    Readmission Risk Interventions No flowsheet data found.

## 2020-05-12 NOTE — Progress Notes (Signed)
Notified Dr. Thedore Mins of [place]:  [name], Kenneth: positive for a left leg DVT in the perineal vein.

## 2020-05-12 NOTE — Progress Notes (Signed)
PROGRESS NOTE                                                                                                                                                                                                             Patient Demographics:    Kenneth [name], is a 49 y.o. male, DOB - [birth year], TOI:[id number]  Outpatient Primary MD for the patient is Kenneth Boyden, MD    LOS - 3  Admit date - 05/09/2020    Chief Complaint  Patient presents with   hypoxia       Brief Narrative - Kenneth [name] is a 49 y.o. male with medical history significant for abdominal discomfort and generalized fatigue, in the ER he was diagnosed with acute hypoxic respiratory failure due to COVID-19 pneumonia and admitted to the hospital.  Unfortunately he is not vaccinated.      Subjective:   Patient in bed, appears comfortable, denies any headache, no fever, no chest pain or pressure, imoproved shortness of breath , no abdominal pain. No focal weakness.    Assessment  & Plan :     1. Acute Hypoxic Resp. Failure due to Acute Covid 19 Viral Pneumonitis + PE - he is unfortunately unvaccinated and has incurred moderate parenchymal lung injury, he has been started on combination of IV steroids and Remdesivir, POA sister has consented for Actemra use if any further clinical decline, will monitor closely if needed Actemra will be used. Full dose Lovenox for now.  Encouraged the patient to sit up in chair in the daytime use I-S and flutter valve for pulmonary toiletry and then prone in bed when at night.  Will advance activity and titrate down oxygen as possible.  Actemra/Baricitinib  off label use - patient/POA - sister Kenneth [name]-  was told that if COVID-19 pneumonitis gets worse we might potentially use Actemra off label, patient denies any known history of active diverticulitis, tuberculosis or hepatitis, understands the risks and benefits and wants  to proceed with Actemra treatment if required.    SpO2: 97 % O2 Flow Rate (L/min): 0 L/min  Recent Labs  Lab 05/09/20 1651 05/09/20 1731 05/09/20 1740 05/10/20 0439 05/10/20 0453 05/11/20 0310 05/12/20 0659  WBC 2.9*  --   --  2.1*  --  2.9* 5.4  HGB 12.7*  --   --  12.7*  --  13.7 13.0  HCT 39.2  --   --  38.3*  --  41.0 38.6*  PLT 170  --   --  224  --  290 326  CRP  --   --   --  14.0*  --  10.3* 5.3*  BNP  --   --   --  104.3*  --  112.2* 37.4  DDIMER  --   --   --  7.73*  --  5.46* 3.63*  PROCALCITON  --   --   --   --  <0.10 <0.10 <0.10  AST 62*  --   --  39  --  66* 38  ALT 37  --   --  37  --  72* 61*  ALKPHOS 95  --   --  102  --  97 83  BILITOT 1.1  --   --  0.6  --  0.5 0.4  ALBUMIN 2.6*  --   --  2.6*  --  2.7* 2.6*  LATICACIDVEN  --   --  1.5  --   --   --   --   SARSCOV2NAA  --  POSITIVE*  --   --   --   --   --     2.  Urinary retention.  Has condom catheter, had 500 cc on bladder scan, after encouragement was able to clear his bladder, will place on Flomax and monitor.  3.  Seizures.  On phenobarbital.  4.  Dyslipidemia.  On statin.  5.  History of Down syndrome.  Supportive care.  6.  Acute PE.  Due to COVID-19 inflammation related hypercoagulability, full dose Lovenox, leg [place] pending.      Condition - Extremely Guarded  Family Communication  : sister Kenneth [name]  [phone]) POA bediside 05/10/20, 05/11/20  Code Status :  Full  Consults  :  None  Procedures  :    CTA - 1. Extensive bilateral main, lobar, segmental and subsegmental sized pulmonary emboli. 2. The appearance of the lungs is most suggestive of severe multilobar bilateral pneumonia, presumably from reported COVID-19 infection. However, the possibility that one or more of these areas could represent alveolar hemorrhage from underlying pulmonary infarction is not excluded.  Leg [place]  PUD Prophylaxis : None  Disposition Plan  :    Status is: Inpatient  Remains inpatient  appropriate because:IV treatments appropriate due to intensity of illness or inability to take PO   Dispo: The patient is from: Home              Anticipated d/c is to: Home              Anticipated d/c date is: > 3 days              Patient currently is not medically stable to d/c.   DVT Prophylaxis  :  Lovenox   Lab Results  Component Value Date   PLT 326 05/12/2020    Diet :  Diet Order            Diet regular Room service appropriate? Yes; Fluid consistency: Thin  Diet effective now                  Inpatient Medications  Scheduled Meds:  enoxaparin (LOVENOX) injection  70 mg Subcutaneous Q12H   methylPREDNISolone (SOLU-MEDROL) injection  60 mg Intravenous BID   multivitamin with minerals  1 tablet Oral Daily   phenobarbital  259.2 mg Oral QHS  pravastatin  10 mg Oral Daily   QUEtiapine  300 mg Oral QHS   tamsulosin  0.4 mg Oral Daily   vitamin B-12  500 mcg Oral Q breakfast   Continuous Infusions:  sodium chloride Stopped (05/10/20 0150)   remdesivir 100 mg in NS 100 mL 100 mg (05/12/20 0828)   PRN Meds:.sodium chloride, lip balm, ondansetron (ZOFRAN) IV  Antibiotics  :    Anti-infectives (From admission, onward)   Start     Dose/Rate Route Frequency Ordered Stop   05/11/20 1000  remdesivir 100 mg in sodium chloride 0.9 % 100 mL IVPB       "Followed by" Linked Group Details   100 mg 200 mL/hr over 30 Minutes Intravenous Daily 05/09/20 2130 05/14/20 0959   05/10/20 1800  remdesivir 100 mg in sodium chloride 0.9 % 100 mL IVPB       "Followed by" Linked Group Details   100 mg 200 mL/hr over 30 Minutes Intravenous Daily-1800 05/09/20 2130 05/10/20 1802   05/10/20 1000  remdesivir 100 mg in sodium chloride 0.9 % 100 mL IVPB  Status:  Discontinued       "Followed by" Linked Group Details   100 mg 200 mL/hr over 30 Minutes Intravenous Daily 05/09/20 2150 05/09/20 2208   05/09/20 2330  azithromycin (ZITHROMAX) 500 mg in sodium chloride 0.9 % 250 mL  IVPB  Status:  Discontinued        500 mg 250 mL/hr over 60 Minutes Intravenous Every 24 hours 05/09/20 2150 05/10/20 0712   05/09/20 2300  cefTRIAXone (ROCEPHIN) 1 g in sodium chloride 0.9 % 100 mL IVPB  Status:  Discontinued        1 g 200 mL/hr over 30 Minutes Intravenous Every 24 hours 05/09/20 2150 05/10/20 6503   05/09/20 2230  remdesivir 200 mg in sodium chloride 0.9% 250 mL IVPB       "Followed by" Linked Group Details   200 mg 580 mL/hr over 30 Minutes Intravenous Once 05/09/20 2130 05/10/20 0017   05/09/20 2150  remdesivir 200 mg in sodium chloride 0.9% 250 mL IVPB  Status:  Discontinued       "Followed by" Linked Group Details   200 mg 580 mL/hr over 30 Minutes Intravenous Once 05/09/20 2150 05/09/20 2208       Time Spent in minutes  30   Susa Raring M.D on 05/12/2020 at 10:16 AM  To page go to [email or web address] - password [email or web address]  [facility] -  Office  [phone]   See all Orders from today for further details    Objective:   Vitals:   05/11/20 0441 05/11/20 0727 05/11/20 2221 05/12/20 0636  BP: 118/83 108/86 115/89 119/84  Pulse: 65 79 82 61  Resp: 20 13 16 10   Temp: 98.2 F (36.8 C) 97.7 F (36.5 C) 98.2 F (36.8 C) 98.2 F (36.8 C)  TempSrc: Axillary Oral Axillary Axillary  SpO2: 94% 92% 100% 97%  Weight:      Height:        Wt Readings from Last 3 Encounters:  05/09/20 74 kg  05/09/20 74.8 kg  01/29/20 74.2 kg     Intake/Output Summary (Last 24 hours) at 05/12/2020 1016 Last data filed at 05/11/2020 2100 Gross per 24 hour  Intake 240 ml  Output 50 ml  Net 190 ml     Physical Exam  Awake Alert, No new F.N deficits  [place].AT,PERRAL Supple Neck,No JVD, No cervical lymphadenopathy appriciated.  Symmetrical Chest wall movement, Good  air movement bilaterally, CTAB RRR,No Gallops, Rubs or new Murmurs, No Parasternal Heave +ve B.Sounds, Abd Soft, No tenderness, No organomegaly appriciated, No rebound - guarding or rigidity. No  Cyanosis, Clubbing or edema, No new Rash or bruise     Data Review:    CBC Recent Labs  Lab 05/09/20 1651 05/10/20 0439 05/11/20 0310 05/12/20 0659  WBC 2.9* 2.1* 2.9* 5.4  HGB 12.7* 12.7* 13.7 13.0  HCT 39.2 38.3* 41.0 38.6*  PLT 170 224 290 326  MCV 102.6* 100.0 101.0* 101.6*  MCH 33.2 33.2 33.7 34.2*  MCHC 32.4 33.2 33.4 33.7  RDW 15.7* 15.1 14.9 15.0  LYMPHSABS 1.0 0.6* 0.9 2.0  MONOABS 0.5 0.2 0.2 0.7  EOSABS 0.0 0.0 0.0 0.0  BASOSABS 0.0 0.0 0.0 0.0    Recent Labs  Lab 05/09/20 1651 05/09/20 1740 05/10/20 0439 05/10/20 0453 05/11/20 0310 05/12/20 0659  NA 139  --  142  --  139 141  K 5.0  --  4.1  --  5.2* 4.2  CL 103  --  105  --  103 100  CO2 28  --  26  --  26 31  GLUCOSE 85  --  126*  --  140* 119*  BUN 10  --  8  --  12 17  CREATININE 1.13  --  0.95  --  0.99 1.00  CALCIUM 8.0*  --  8.3*  --  8.6* 8.4*  AST 62*  --  39  --  66* 38  ALT 37  --  37  --  72* 61*  ALKPHOS 95  --  102  --  97 83  BILITOT 1.1  --  0.6  --  0.5 0.4  ALBUMIN 2.6*  --  2.6*  --  2.7* 2.6*  MG  --   --  2.3  --  2.4 2.3  CRP  --   --  14.0*  --  10.3* 5.3*  DDIMER  --   --  7.73*  --  5.46* 3.63*  PROCALCITON  --   --   --  <0.10 <0.10 <0.10  LATICACIDVEN  --  1.5  --   --   --   --   BNP  --   --  104.3*  --  112.2* 37.4    ------------------------------------------------------------------------------------------------------------------ No results for input(s): CHOL, HDL, LDLCALC, TRIG, CHOLHDL, LDLDIRECT in the last 72 hours.  No results found for: HGBA1C ------------------------------------------------------------------------------------------------------------------ No results for input(s): TSH, T4TOTAL, T3FREE, THYROIDAB in the last 72 hours.  Invalid input(s): FREET3  Cardiac Enzymes No results for input(s): CKMB, TROPONINI, MYOGLOBIN in the last 168 hours.  Invalid input(s):  CK ------------------------------------------------------------------------------------------------------------------    Component Value Date/Time   BNP 37.4 05/12/2020 0659    Micro Results Recent Results (from the past 240 hour(s))  Respiratory Panel by RT PCR (Flu A&B, Covid) - Nasopharyngeal Swab     Status: Abnormal   Collection Time: 05/09/20  5:31 PM   Specimen: Nasopharyngeal Swab  Result Value Ref Range Status   SARS Coronavirus 2 by RT PCR POSITIVE (A) NEGATIVE Final    Comment: RESULT CALLED TO, READ BACK BY AND VERIFIED WITH: RN D SIDBURY [id number] MLM (NOTE) SARS-CoV-2 target nucleic acids are DETECTED.  SARS-CoV-2 RNA is generally detectable in upper respiratory specimens  during the acute phase of infection. Positive results are indicative of the presence of the identified virus, but do not rule out bacterial infection or co-infection with other pathogens not  detected by the test. Clinical correlation with patient history and other diagnostic information is necessary to determine patient infection status. The expected result is Negative.  Fact Sheet for Patients:  https://www.moore.com/  Fact Sheet for Healthcare Providers: https://www.young.biz/  This test is not yet approved or cleared by the [place] FDA and  has been authorized for detection and/or diagnosis of SARS-CoV-2 by FDA under an Emergency Use Authorization (EUA).  This EUA will remain in effect (meaning this test can be used)  for the duration of  the COVID-19 declaration under Section 564(b)(1) of the Act, 21 U.S.C. section 360bbb-3(b)(1), unless the authorization is terminated or revoked sooner.      Influenza A by PCR NEGATIVE NEGATIVE Final   Influenza B by PCR NEGATIVE NEGATIVE Final    Comment: (NOTE) The Xpert Xpress SARS-CoV-2/FLU/RSV assay is intended as an aid in  the diagnosis of influenza from Nasopharyngeal swab specimens and  should  not be used as a sole basis for treatment. Nasal washings and  aspirates are unacceptable for Xpert Xpress SARS-CoV-2/FLU/RSV  testing.  Fact Sheet for Patients: https://www.moore.com/  Fact Sheet for Healthcare Providers: https://www.young.biz/  This test is not yet approved or cleared by the [place] FDA and  has been authorized for detection and/or diagnosis of SARS-CoV-2 by  FDA under an Emergency Use Authorization (EUA). This EUA will remain  in effect (meaning this test can be used) for the duration of the  Covid-19 declaration under Section 564(b)(1) of the Act, 21  U.S.C. section 360bbb-3(b)(1), unless the authorization is  terminated or revoked. Performed at [facility], [address] [place], [place] [address]     Radiology Reports CT ANGIO CHEST PE W OR WO CONTRAST  Result Date: 05/10/2020 CLINICAL DATA:  49 year old male with history of shortness of breath. Suspected pulmonary embolism. EXAM: CT ANGIOGRAPHY CHEST WITH CONTRAST TECHNIQUE: Multidetector CT imaging of the chest was performed using the standard protocol during bolus administration of intravenous contrast. Multiplanar CT image reconstructions and MIPs were obtained to evaluate the vascular anatomy. CONTRAST:  75mL OMNIPAQUE IOHEXOL 350 MG/ML SOLN COMPARISON:  Chest CTA 10/20/2012. FINDINGS: Cardiovascular: Multiple filling defects are noted within the pulmonary arterial tree bilaterally, indicative of widespread pulmonary emboli. This includes distal main, lobar, segmental and subsegmental sized branches in the lungs bilaterally, most of which are nonocclusive, but there are several occlusive filling defects as well. Heart size is borderline enlarged. There is no significant pericardial fluid, thickening or pericardial calcification. No atherosclerotic calcifications in the thoracic aorta or the coronary arteries. Mitral annular calcifications. Mediastinum/Nodes: No  pathologically enlarged mediastinal or hilar lymph nodes. Patulous esophagus. No axillary lymphadenopathy. Lungs/Pleura: Patchy multifocal areas of ground-glass attenuation and interstitial prominence are noted throughout the lungs bilaterally, most evident in a peribronchovascular distribution, most severe throughout the mid to upper lungs. No definite pleural effusions. Upper Abdomen: Unremarkable. Musculoskeletal: There are no aggressive appearing lytic or blastic lesions noted in the visualized portions of the skeleton. Review of the MIP images confirms the above findings. IMPRESSION: 1. Extensive bilateral main, lobar, segmental and subsegmental sized pulmonary emboli. 2. The appearance of the lungs is most suggestive of severe multilobar bilateral pneumonia, presumably from reported COVID-19 infection. However, the possibility that one or more of these areas could represent alveolar hemorrhage from underlying pulmonary infarction is not excluded. Critical Value/emergent results were called by telephone at the time of interpretation on 05/10/2020 at 2:42 pm to provider [facility], who verbally acknowledged these results. Electronically  Signed   By: Trudie Reedaniel  Entrikin M.D.   On: 05/10/2020 14:46   DG Chest Portable 1 View  Result Date: 05/09/2020 CLINICAL DATA:  49 year old male with lethargy. EXAM: PORTABLE CHEST 1 VIEW COMPARISON:  Chest radiograph dated 07/27/2017. FINDINGS: Bilateral confluent airspace opacities most consistent with multifocal pneumonia, likely viral or atypical in etiology including COVID-19. Clinical correlation and follow-up recommended. No pleural effusion or pneumothorax. Stable cardiac silhouette. No acute osseous pathology. IMPRESSION: Multifocal pneumonia. Electronically Signed   By: Elgie CollardArash  Radparvar M.D.   On: 05/09/2020 18:24

## 2020-05-13 LAB — CBC WITH DIFFERENTIAL/PLATELET
Abs Immature Granulocytes: 0.16 10*3/uL — ABNORMAL HIGH (ref 0.00–0.07)
Basophils Absolute: 0 10*3/uL (ref 0.0–0.1)
Basophils Relative: 0 %
Eosinophils Absolute: 0 10*3/uL (ref 0.0–0.5)
Eosinophils Relative: 0 %
HCT: 40.1 % (ref 39.0–52.0)
Hemoglobin: 13.1 g/dL (ref 13.0–17.0)
Immature Granulocytes: 4 %
Lymphocytes Relative: 28 %
Lymphs Abs: 1.2 10*3/uL (ref 0.7–4.0)
MCH: 32.9 pg (ref 26.0–34.0)
MCHC: 32.7 g/dL (ref 30.0–36.0)
MCV: 100.8 fL — ABNORMAL HIGH (ref 80.0–100.0)
Monocytes Absolute: 0.4 10*3/uL (ref 0.1–1.0)
Monocytes Relative: 8 %
Neutro Abs: 2.7 10*3/uL (ref 1.7–7.7)
Neutrophils Relative %: 60 %
Platelets: 316 10*3/uL (ref 150–400)
RBC: 3.98 MIL/uL — ABNORMAL LOW (ref 4.22–5.81)
RDW: 14.8 % (ref 11.5–15.5)
WBC: 4.5 10*3/uL (ref 4.0–10.5)
nRBC: 0 % (ref 0.0–0.2)

## 2020-05-13 LAB — MAGNESIUM: Magnesium: 2.2 mg/dL (ref 1.7–2.4)

## 2020-05-13 LAB — COMPREHENSIVE METABOLIC PANEL
ALT: 64 U/L — ABNORMAL HIGH (ref 0–44)
AST: 36 U/L (ref 15–41)
Albumin: 2.8 g/dL — ABNORMAL LOW (ref 3.5–5.0)
Alkaline Phosphatase: 91 U/L (ref 38–126)
Anion gap: 11 (ref 5–15)
BUN: 16 mg/dL (ref 6–20)
CO2: 29 mmol/L (ref 22–32)
Calcium: 8.7 mg/dL — ABNORMAL LOW (ref 8.9–10.3)
Chloride: 100 mmol/L (ref 98–111)
Creatinine, Ser: 1.03 mg/dL (ref 0.61–1.24)
GFR, Estimated: >60 mL/min (ref >60)
Glucose, Bld: 146 mg/dL — ABNORMAL HIGH (ref 70–99)
Potassium: 4.5 mmol/L (ref 3.5–5.1)
Sodium: 140 mmol/L (ref 135–145)
Total Bilirubin: 0.6 mg/dL (ref 0.3–1.2)
Total Protein: 6.9 g/dL (ref 6.5–8.1)

## 2020-05-13 LAB — BRAIN NATRIURETIC PEPTIDE: B Natriuretic Peptide: 37.3 pg/mL (ref 0.0–100.0)

## 2020-05-13 LAB — C-REACTIVE PROTEIN: CRP: 3.6 mg/dL — ABNORMAL HIGH (ref <1.0)

## 2020-05-13 LAB — D-DIMER, QUANTITATIVE: D-Dimer, Quant: 2.74 ug/mL-FEU — ABNORMAL HIGH (ref 0.00–0.50)

## 2020-05-13 NOTE — Plan of Care (Signed)
  Problem: Clinical Measurements: Goal: Will remain free from infection Outcome: Progressing   Problem: Coping: Goal: Level of anxiety will decrease Outcome: Progressing   Problem: Elimination: Goal: Will not experience complications related to urinary retention Outcome: Progressing   Problem: Coping: Goal: Psychosocial and spiritual needs will be supported Outcome: Progressing   Problem: Respiratory: Goal: Will maintain a patent airway Outcome: Progressing

## 2020-05-13 NOTE — Progress Notes (Signed)
PROGRESS NOTE                                                                                                                                                                                                             Patient Demographics:    Kenneth [name], is a 49 y.o. male, DOB - 18-Sep-1970, ZOX:[id number]  Outpatient Primary MD for the patient is Kenneth Boyden, MD    LOS - 4  Admit date - 05/09/2020    Chief Complaint  Patient presents with  . hypoxia       Brief Narrative - Kenneth [name] is a 49 y.o. male with medical history significant for abdominal discomfort and generalized fatigue, in the ER he was diagnosed with acute hypoxic respiratory failure due to COVID-19 pneumonia and admitted to the hospital.  Unfortunately he is not vaccinated.      Subjective:   Patient in recliner, appears comfortable, denies any headache, no fever, no chest pain or pressure, no shortness of breath , no abdominal pain. No focal weakness.   Assessment  & Plan :     1. Acute Hypoxic Resp. Failure due to Acute Covid 19 Viral Pneumonitis + PE - he is unfortunately unvaccinated and has incurred moderate parenchymal lung injury, he has been started on combination of IV steroids and Remdesivir, POA sister has consented for Actemra use if any further clinical decline, will monitor closely if needed Actemra will be used. Full dose Lovenox for now.  Encouraged the patient to sit up in chair in the daytime use I-S and flutter valve for pulmonary toiletry and then prone in bed when at night.  Will advance activity and titrate down oxygen as possible.    SpO2: 91 % O2 Flow Rate (L/min): 0 L/min  Recent Labs  Lab 05/09/20 1651 05/09/20 1731 05/09/20 1740 05/10/20 0439 05/10/20 0453 05/11/20 0310 05/12/20 0659 05/13/20 0151  WBC 2.9*  --   --  2.1*  --  2.9* 5.4 4.5  HGB 12.7*  --   --  12.7*  --  13.7 13.0 13.1  HCT 39.2  --    --  38.3*  --  41.0 38.6* 40.1  PLT 170  --   --  224  --  290 326 316  CRP  --   --   --  14.0*  --  10.3* 5.3* 3.6*  BNP  --   --   --  104.3*  --  112.2* 37.4 37.3  DDIMER  --   --   --  7.73*  --  5.46* 3.63* 2.74*  PROCALCITON  --   --   --   --  <0.10 <0.10 <0.10  --   AST 62*  --   --  39  --  66* 38 36  ALT 37  --   --  37  --  72* 61* 64*  ALKPHOS 95  --   --  102  --  97 83 91  BILITOT 1.1  --   --  0.6  --  0.5 0.4 0.6  ALBUMIN 2.6*  --   --  2.6*  --  2.7* 2.6* 2.8*  LATICACIDVEN  --   --  1.5  --   --   --   --   --   SARSCOV2NAA  --  POSITIVE*  --   --   --   --   --   --     2.  Urinary retention.  Has condom catheter, had 500 cc on bladder scan, after encouragement was able to clear his bladder, will place on Flomax and monitor.  3.  Seizures.  On phenobarbital.  4.  Dyslipidemia.  On statin.  5.  History of Down syndrome.  Supportive care.  6.  Acute PE and acute left lower extremity DVT.  Due to COVID-19 inflammation related hypercoagulability, full dose Lovenox, due to patient being on phenobarbital no good oral substitutes.      Condition - Extremely Guarded  Family Communication  : sister Cala Bradford  [phone]) POA bediside 05/10/20, 05/11/20, 05/12/2020, 05/13/2020  Code Status :  Full  Consults  :  None  Procedures  :    CTA - 1. Extensive bilateral main, lobar, segmental and subsegmental sized pulmonary emboli. 2. The appearance of the lungs is most suggestive of severe multilobar bilateral pneumonia, presumably from reported COVID-19 infection. However, the possibility that one or more of these areas could represent alveolar hemorrhage from underlying pulmonary infarction is not excluded.  Leg [place] - positive left lower extremity DVT.  PUD Prophylaxis : None  Disposition Plan  :    Status is: Inpatient  Remains inpatient appropriate because:IV treatments appropriate due to intensity of illness or inability to take PO   Dispo: The patient is  from: Home              Anticipated d/c is to: Home              Anticipated d/c date is: > 3 days              Patient currently is not medically stable to d/c.   DVT Prophylaxis  :  Lovenox   Lab Results  Component Value Date   PLT 316 05/13/2020    Diet :  Diet Order            Diet regular Room service appropriate? Yes; Fluid consistency: Thin  Diet effective now                  Inpatient Medications  Scheduled Meds: . enoxaparin (LOVENOX) injection  70 mg Subcutaneous Q12H  . gabapentin  200 mg Oral Daily  . gabapentin  300 mg Oral QHS  . methylPREDNISolone (SOLU-MEDROL) injection  60 mg Intravenous BID  . multivitamin with minerals  1 tablet Oral Daily  .  phenobarbital  259.2 mg Oral QHS  . pravastatin  10 mg Oral Daily  . QUEtiapine  300 mg Oral QHS  . tamsulosin  0.4 mg Oral Daily  . vitamin B-12  500 mcg Oral Q breakfast   Continuous Infusions: . sodium chloride Stopped (05/10/20 0150)   PRN Meds:.sodium chloride, acetaminophen, guaiFENesin-dextromethorphan, lip balm, ondansetron (ZOFRAN) IV  Antibiotics  :    Anti-infectives (From admission, onward)   Start     Dose/Rate Route Frequency Ordered Stop   05/11/20 1000  remdesivir 100 mg in sodium chloride 0.9 % 100 mL IVPB       "Followed by" Linked Group Details   100 mg 200 mL/hr over 30 Minutes Intravenous Daily 05/09/20 2130 05/13/20 1043   05/10/20 1800  remdesivir 100 mg in sodium chloride 0.9 % 100 mL IVPB       "Followed by" Linked Group Details   100 mg 200 mL/hr over 30 Minutes Intravenous Daily-1800 05/09/20 2130 05/10/20 1802   05/10/20 1000  remdesivir 100 mg in sodium chloride 0.9 % 100 mL IVPB  Status:  Discontinued       "Followed by" Linked Group Details   100 mg 200 mL/hr over 30 Minutes Intravenous Daily 05/09/20 2150 05/09/20 2208   05/09/20 2330  azithromycin (ZITHROMAX) 500 mg in sodium chloride 0.9 % 250 mL IVPB  Status:  Discontinued        500 mg 250 mL/hr over 60 Minutes  Intravenous Every 24 hours 05/09/20 2150 05/10/20 0712   05/09/20 2300  cefTRIAXone (ROCEPHIN) 1 g in sodium chloride 0.9 % 100 mL IVPB  Status:  Discontinued        1 g 200 mL/hr over 30 Minutes Intravenous Every 24 hours 05/09/20 2150 05/10/20 1937   05/09/20 2230  remdesivir 200 mg in sodium chloride 0.9% 250 mL IVPB       "Followed by" Linked Group Details   200 mg 580 mL/hr over 30 Minutes Intravenous Once 05/09/20 2130 05/10/20 0017   05/09/20 2150  remdesivir 200 mg in sodium chloride 0.9% 250 mL IVPB  Status:  Discontinued       "Followed by" Linked Group Details   200 mg 580 mL/hr over 30 Minutes Intravenous Once 05/09/20 2150 05/09/20 2208       Time Spent in minutes  30   Susa Raring M.D on 05/13/2020 at 11:56 AM  To page go to [email or web address] - password [facility]  [facility] -  Office  [phone]   See all Orders from today for further details    Objective:   Vitals:   05/12/20 1433 05/12/20 2000 05/13/20 0000 05/13/20 0647  BP: 111/83 122/85 102/76   Pulse: 75 77 65   Resp: 13 18 13    Temp: 98.3 F (36.8 C) 98.1 F (36.7 C) 98.2 F (36.8 C) 98 F (36.7 C)  TempSrc: Oral Oral Oral Oral  SpO2: 98% 92% 91%   Weight:      Height:        Wt Readings from Last 3 Encounters:  05/09/20 74 kg  05/09/20 74.8 kg  01/29/20 74.2 kg     Intake/Output Summary (Last 24 hours) at 05/13/2020 1156 Last data filed at 05/13/2020 1147 Gross per 24 hour  Intake 220 ml  Output 2750 ml  Net -2530 ml     Physical Exam  Awake Alert, No new F.N deficits,  [place].AT,PERRAL Supple Neck,No JVD, No cervical lymphadenopathy appriciated.  Symmetrical Chest wall movement, Good air movement bilaterally,  CTAB RRR,No Gallops, Rubs or new Murmurs, No Parasternal Heave +ve B.Sounds, Abd Soft, No tenderness, No organomegaly appriciated, No rebound - guarding or rigidity. No Cyanosis, Clubbing or edema, No new Rash or bruise    Data Review:    CBC Recent Labs  Lab  05/09/20 1651 05/10/20 0439 05/11/20 0310 05/12/20 0659 05/13/20 0151  WBC 2.9* 2.1* 2.9* 5.4 4.5  HGB 12.7* 12.7* 13.7 13.0 13.1  HCT 39.2 38.3* 41.0 38.6* 40.1  PLT 170 224 290 326 316  MCV 102.6* 100.0 101.0* 101.6* 100.8*  MCH 33.2 33.2 33.7 34.2* 32.9  MCHC 32.4 33.2 33.4 33.7 32.7  RDW 15.7* 15.1 14.9 15.0 14.8  LYMPHSABS 1.0 0.6* 0.9 2.0 1.2  MONOABS 0.5 0.2 0.2 0.7 0.4  EOSABS 0.0 0.0 0.0 0.0 0.0  BASOSABS 0.0 0.0 0.0 0.0 0.0    Recent Labs  Lab 05/09/20 1651 05/09/20 1740 05/10/20 0439 05/10/20 0453 05/11/20 0310 05/12/20 0659 05/13/20 0151  NA 139  --  142  --  139 141 140  K 5.0  --  4.1  --  5.2* 4.2 4.5  CL 103  --  105  --  103 100 100  CO2 28  --  26  --  26 31 29   GLUCOSE 85  --  126*  --  140* 119* 146*  BUN 10  --  8  --  12 17 16   CREATININE 1.13  --  0.95  --  0.99 1.00 1.03  CALCIUM 8.0*  --  8.3*  --  8.6* 8.4* 8.7*  AST 62*  --  39  --  66* 38 36  ALT 37  --  37  --  72* 61* 64*  ALKPHOS 95  --  102  --  97 83 91  BILITOT 1.1  --  0.6  --  0.5 0.4 0.6  ALBUMIN 2.6*  --  2.6*  --  2.7* 2.6* 2.8*  MG  --   --  2.3  --  2.4 2.3 2.2  CRP  --   --  14.0*  --  10.3* 5.3* 3.6*  DDIMER  --   --  7.73*  --  5.46* 3.63* 2.74*  PROCALCITON  --   --   --  <0.10 <0.10 <0.10  --   LATICACIDVEN  --  1.5  --   --   --   --   --   BNP  --   --  104.3*  --  112.2* 37.4 37.3    ------------------------------------------------------------------------------------------------------------------ No results for input(s): CHOL, HDL, LDLCALC, TRIG, CHOLHDL, LDLDIRECT in the last 72 hours.  No results found for: HGBA1C ------------------------------------------------------------------------------------------------------------------ No results for input(s): TSH, T4TOTAL, T3FREE, THYROIDAB in the last 72 hours.  Invalid input(s): FREET3  Cardiac Enzymes No results for input(s): CKMB, TROPONINI, MYOGLOBIN in the last 168 hours.  Invalid input(s):  CK ------------------------------------------------------------------------------------------------------------------    Component Value Date/Time   BNP 37.3 05/13/2020 0151    Micro Results Recent Results (from the past 240 hour(s))  Respiratory Panel by RT PCR (Flu A&B, Covid) - Nasopharyngeal Swab     Status: Abnormal   Collection Time: 05/09/20  5:31 PM   Specimen: Nasopharyngeal Swab  Result Value Ref Range Status   SARS Coronavirus 2 by RT PCR POSITIVE (A) NEGATIVE Final    Comment: RESULT CALLED TO, READ BACK BY AND VERIFIED WITH: RN D SIDBURY 05/15/2020 1928 MLM (NOTE) SARS-CoV-2 target nucleic acids are DETECTED.  SARS-CoV-2 RNA is generally detectable  in upper respiratory specimens  during the acute phase of infection. Positive results are indicative of the presence of the identified virus, but do not rule out bacterial infection or co-infection with other pathogens not detected by the test. Clinical correlation with patient history and other diagnostic information is necessary to determine patient infection status. The expected result is Negative.  Fact Sheet for Patients:  https://www.moore.com/  Fact Sheet for Healthcare Providers: https://www.young.biz/  This test is not yet approved or cleared by the [place] FDA and  has been authorized for detection and/or diagnosis of SARS-CoV-2 by FDA under an Emergency Use Authorization (EUA).  This EUA will remain in effect (meaning this test can be used)  for the duration of  the COVID-19 declaration under Section 564(b)(1) of the Act, 21 U.S.C. section 360bbb-3(b)(1), unless the authorization is terminated or revoked sooner.      Influenza A by PCR NEGATIVE NEGATIVE Final   Influenza B by PCR NEGATIVE NEGATIVE Final    Comment: (NOTE) The Xpert Xpress SARS-CoV-2/FLU/RSV assay is intended as an aid in  the diagnosis of influenza from Nasopharyngeal swab specimens and  should  not be used as a sole basis for treatment. Nasal washings and  aspirates are unacceptable for Xpert Xpress SARS-CoV-2/FLU/RSV  testing.  Fact Sheet for Patients: https://www.moore.com/  Fact Sheet for Healthcare Providers: https://www.young.biz/  This test is not yet approved or cleared by the [place] FDA and  has been authorized for detection and/or diagnosis of SARS-CoV-2 by  FDA under an Emergency Use Authorization (EUA). This EUA will remain  in effect (meaning this test can be used) for the duration of the  Covid-19 declaration under Section 564(b)(1) of the Act, 21  U.S.C. section 360bbb-3(b)(1), unless the authorization is  terminated or revoked. Performed at [facility], [address], [place], [place] [address]     Radiology Reports CT ANGIO CHEST PE W OR WO CONTRAST  Result Date: 05/10/2020 CLINICAL DATA:  49 year old male with history of shortness of breath. Suspected pulmonary embolism. EXAM: CT ANGIOGRAPHY CHEST WITH CONTRAST TECHNIQUE: Multidetector CT imaging of the chest was performed using the standard protocol during bolus administration of intravenous contrast. Multiplanar CT image reconstructions and MIPs were obtained to evaluate the vascular anatomy. CONTRAST:  109mL OMNIPAQUE IOHEXOL 350 MG/ML SOLN COMPARISON:  Chest CTA 10/20/2012. FINDINGS: Cardiovascular: Multiple filling defects are noted within the pulmonary arterial tree bilaterally, indicative of widespread pulmonary emboli. This includes distal main, lobar, segmental and subsegmental sized branches in the lungs bilaterally, most of which are nonocclusive, but there are several occlusive filling defects as well. Heart size is borderline enlarged. There is no significant pericardial fluid, thickening or pericardial calcification. No atherosclerotic calcifications in the thoracic aorta or the coronary arteries. Mitral annular calcifications. Mediastinum/Nodes: No  pathologically enlarged mediastinal or hilar lymph nodes. Patulous esophagus. No axillary lymphadenopathy. Lungs/Pleura: Patchy multifocal areas of ground-glass attenuation and interstitial prominence are noted throughout the lungs bilaterally, most evident in a peribronchovascular distribution, most severe throughout the mid to upper lungs. No definite pleural effusions. Upper Abdomen: Unremarkable. Musculoskeletal: There are no aggressive appearing lytic or blastic lesions noted in the visualized portions of the skeleton. Review of the MIP images confirms the above findings. IMPRESSION: 1. Extensive bilateral main, lobar, segmental and subsegmental sized pulmonary emboli. 2. The appearance of the lungs is most suggestive of severe multilobar bilateral pneumonia, presumably from reported COVID-19 infection. However, the possibility that one or more of these areas could represent alveolar  hemorrhage from underlying pulmonary infarction is not excluded. Critical Value/emergent results were called by telephone at the time of interpretation on 05/10/2020 at 2:42 pm to provider [facility], who verbally acknowledged these results. Electronically Signed   By: Trudie Reed M.D.   On: 05/10/2020 14:46   DG Chest Portable 1 View  Result Date: 05/09/2020 CLINICAL DATA:  49 year old male with lethargy. EXAM: PORTABLE CHEST 1 VIEW COMPARISON:  Chest radiograph dated 07/27/2017. FINDINGS: Bilateral confluent airspace opacities most consistent with multifocal pneumonia, likely viral or atypical in etiology including COVID-19. Clinical correlation and follow-up recommended. No pleural effusion or pneumothorax. Stable cardiac silhouette. No acute osseous pathology. IMPRESSION: Multifocal pneumonia. Electronically Signed   By: Elgie Collard M.D.   On: 05/09/2020 18:24   VAS [place] LOWER EXTREMITY VENOUS (DVT)  Result Date: 05/12/2020  Lower Venous DVT Study Indications: Elevated Ddimer.  Risk Factors: COVID 19  positive. Comparison Study: No prior studies. Performing Technologist: Chanda Busing RVT  Examination Guidelines: A complete evaluation includes B-mode imaging, spectral Doppler, color Doppler, and power Doppler as needed of all accessible portions of each vessel. Bilateral testing is considered an integral part of a complete examination. Limited examinations for reoccurring indications [place] be performed as noted. The reflux portion of the exam is performed with the patient in reverse Trendelenburg.  +---------+---------------+---------+-----------+----------+--------------+ RIGHT    CompressibilityPhasicitySpontaneityPropertiesThrombus Aging +---------+---------------+---------+-----------+----------+--------------+ CFV      Full           Yes      Yes                                 +---------+---------------+---------+-----------+----------+--------------+ SFJ      Full                                                        +---------+---------------+---------+-----------+----------+--------------+ FV Prox  Full                                                        +---------+---------------+---------+-----------+----------+--------------+ FV Mid   Full                                                        +---------+---------------+---------+-----------+----------+--------------+ FV DistalFull                                                        +---------+---------------+---------+-----------+----------+--------------+ PFV      Full                                                        +---------+---------------+---------+-----------+----------+--------------+ POP  Full           Yes      Yes                                 +---------+---------------+---------+-----------+----------+--------------+ PTV      Full                                                         +---------+---------------+---------+-----------+----------+--------------+ PERO     Full                                                        +---------+---------------+---------+-----------+----------+--------------+   +---------+---------------+---------+-----------+----------+--------------+ LEFT     CompressibilityPhasicitySpontaneityPropertiesThrombus Aging +---------+---------------+---------+-----------+----------+--------------+ CFV      Full           Yes      Yes                                 +---------+---------------+---------+-----------+----------+--------------+ SFJ      Full                                                        +---------+---------------+---------+-----------+----------+--------------+ FV Prox  Full                                                        +---------+---------------+---------+-----------+----------+--------------+ FV Mid   Full                                                        +---------+---------------+---------+-----------+----------+--------------+ FV DistalFull                                                        +---------+---------------+---------+-----------+----------+--------------+ PFV      Full                                                        +---------+---------------+---------+-----------+----------+--------------+ POP      Full           Yes      Yes                                 +---------+---------------+---------+-----------+----------+--------------+  PTV      Full                                                        +---------+---------------+---------+-----------+----------+--------------+ PERO     Partial                                      Acute          +---------+---------------+---------+-----------+----------+--------------+     Summary: RIGHT: - There is no evidence of deep vein thrombosis in the lower extremity.  - No cystic structure found in  the popliteal fossa.  LEFT: - Findings consistent with acute deep vein thrombosis involving the left peroneal veins. - No cystic structure found in the popliteal fossa.  *See table(s) above for measurements and observations. Electronically signed by Gretta Beganodd Early MD on 05/12/2020 at 3:07:46 PM.    Final

## 2020-05-13 NOTE — Progress Notes (Signed)
Occupational Therapy Treatment Patient Details Name: Kenneth [name] MRN: [id number] DOB: 1971-06-01 Today's Date: 05/13/2020    History of present illness Pt is a 49 y.o. male presenting 101021 with reported weakness per caregiver. Tested COVID-19 positive; not vaccinated. CT angio with extensive bilateral PE. PMH includes Down syndrome, seizures, congenital heart defect, acute pulmonary edema, osteoarthritis, avascular necrosis, bilateral cataracts, agitation, HLD.   OT comments  Pt progressing towards established OT goals. Pt performing toileting and hand hygiene with Min Guard A. Pt's sister reports he took a shower this morning with her assist and tolerated it well. Pt performing functional mobility in hallway with Min Guard-Min A. Pt with moments where he presenting as hypotensive; however, BP 116/77 and SpO2 in 90s on RA. Feel possibly due to self limiting behaviors as pt likes being here and is anticipating dc soon. Continue to recommend dc to home with HHOT and will continue to follow acutely as admitted.    Follow Up Recommendations  Home health OT;Supervision/Assistance - 24 hour    Equipment Recommendations  None recommended by OT    Recommendations for Other Services PT consult    Precautions / Restrictions Precautions Precautions: Fall       Mobility Bed Mobility               General bed mobility comments: Received sitting in recliner  Transfers Overall transfer level: Needs assistance Equipment used: None Transfers: Sit to/from Stand Sit to Stand: Min guard         General transfer comment: Min Guard A for safety    Balance Overall balance assessment: Needs assistance Sitting-balance support: No upper extremity supported;Feet supported Sitting balance-Leahy Scale: Good Sitting balance - Comments: Increased time and effort to initiate anterior weight translation to edge of recliner, once pt able to do this, seated balance good and able to don  bilateral socks with figure-4 technique   Standing balance support: Bilateral upper extremity supported;During functional activity Standing balance-Leahy Scale: Fair Standing balance comment: Can static stand and take steps without UE support, min guard; able to perform posterior pericare without UE support after using BSC                           ADL either performed or assessed with clinical judgement   ADL Overall ADL's : Needs assistance/impaired     Grooming: Min guard;Wash/dry hands;Standing                   Toilet Transfer: IT trainer Details (indicate cue type and reason): Min Guard A for safety         Functional mobility during ADLs: Minimal assistance General ADL Comments: Pt performing toileting, hadn hygiene and functional mobility with Min guard - Min A. At times presenting with weakness but unsure if he is weak or self limiting behaviors.      Vision       Perception     Praxis      Cognition Arousal/Alertness: Awake/alert Behavior During Therapy: Flat affect Overall Cognitive Status: History of cognitive impairments - at baseline                                 General Comments: H/o Down's Syndrome. Minimal verbalizations, but talking more throughout session and with encouragement. Frequent encouragement to complete tasks as independently as possible; following majority of one step commands well  with increased time        Exercises     Shoulder Instructions       General Comments SpO2 showing in 80s on RA during mobility; but poor pleth line. Switching O2 monitor and pt SpO2 90s on RA throughout. BP 116/77 with mobility.     Pertinent Vitals/ Pain       Pain Assessment: Faces Faces Pain Scale: No hurt Pain Intervention(s): Monitored during session  Home Living                                          Prior Functioning/Environment              Frequency   Min 2X/week        Progress Toward Goals  OT Goals(current goals can now be found in the care plan section)  Progress towards OT goals: Progressing toward goals  Acute Rehab OT Goals Patient Stated Goal: Return home with continued 24/7 family support OT Goal Formulation: With patient Time For Goal Achievement: 05/25/20 Potential to Achieve Goals: Good ADL Goals Pt Will Perform Grooming: with modified independence;sitting;standing Pt Will Perform Lower Body Dressing: sit to/from stand;with set-up;with supervision Pt Will Transfer to Toilet: with set-up;with supervision;ambulating;bedside commode Pt Will Perform Toileting - Clothing Manipulation and hygiene: with set-up;with supervision;sitting/lateral leans;sit to/from stand Additional ADL Goal #1: Pt will use purse lip breathing during ADLs with Min cues Additional ADL Goal #2: Pt's family will independently verbalize three energy conservation techniques for ADLs and IADLs  Plan Discharge plan remains appropriate    Co-evaluation                 AM-PAC OT "6 Clicks" Daily Activity     Outcome Measure   Help from another person eating meals?: A Little Help from another person taking care of personal grooming?: A Little Help from another person toileting, which includes using toliet, bedpan, or urinal?: A Lot Help from another person bathing (including washing, rinsing, drying)?: A Little Help from another person to put on and taking off regular upper body clothing?: A Little Help from another person to put on and taking off regular lower body clothing?: A Little 6 Click Score: 17    End of Session Equipment Utilized During Treatment: Gait belt  OT Visit Diagnosis: Unsteadiness on feet (R26.81);Other abnormalities of gait and mobility (R26.89);Muscle weakness (generalized) (M62.81)   Activity Tolerance Patient limited by fatigue   Patient Left in chair;with call bell/phone within reach;with family/visitor present    Nurse Communication Mobility status        Time: 9735-3299 OT Time Calculation (min): 33 min  Charges: OT General Charges $OT Visit: 1 Visit OT Treatments $Self Care/Home Management : 8-22 mins $Therapeutic Activity: 8-22 mins  Kenneth [name] MSOT, OTR/L Acute Rehab Pager: [phone] Office: [phone]   Kenneth [name] 05/13/2020, 6:22 PM

## 2020-05-13 NOTE — TOC Benefit Eligibility Note (Signed)
Transition of Care [facility]) Benefit Eligibility Note    Patient Details  Name: Kenneth [name] MRN: [id number] Date of Birth: 07-13-1971   Medication/Dose: Lovenox 80mg  subq. daily and Apixaban 5mg .bid for 30 day supply  Covered?: Yes  Tier: 3 Drug (and tier 4)  Prescription Coverage Preferred Pharmacy: [facility]  Spoke with Person/Company/Phone Number:: [facility] Help Desk PH#[phone]  Co-Pay: $25.00 Lovenox and $30.00 Apixaban (Eliquis)  Prior Approval: No  Deductible: Unmet       [name] Phone Number: 05/13/2020, 3:57 PM

## 2020-05-13 NOTE — TOC Progression Note (Signed)
Transition of Care [facility]) - Progression Note    Patient Details  Name: Kenneth [name] MRN: [id number] Date of Birth: 1971/04/02  [facility] CM/SW Contact  Lockie Pares, RN Phone Number: 05/13/2020, 2:30 PM  Clinical Narrative:    Eligibility sent for lovenox and eliquis. Following patient for [facility] needs. FOLLOW UP WITH PRIMARY CARE MD, MAKE APPOINTMENT . Will need teaching on lovenox injections if needs to go home with them.    Expected Discharge Plan: Home w Home Health Services Barriers to Discharge: Continued Medical Work up  Expected Discharge Plan and Services Expected Discharge Plan: Home w Home Health Services   Discharge Planning Services: CM Consult Post Acute Care Choice: Home Health Living arrangements for the past 2 months: Single Family Home                                       Social Determinants of Health (SDOH) Interventions    Readmission Risk Interventions No flowsheet data found.

## 2020-05-14 ENCOUNTER — Telehealth: Payer: Self-pay

## 2020-05-14 ENCOUNTER — Other Ambulatory Visit (HOSPITAL_COMMUNITY): Payer: Self-pay | Admitting: Internal Medicine

## 2020-05-14 LAB — C-REACTIVE PROTEIN: CRP: 2.4 mg/dL — ABNORMAL HIGH (ref <1.0)

## 2020-05-14 LAB — CBC WITH DIFFERENTIAL/PLATELET
Abs Immature Granulocytes: 0.28 10*3/uL — ABNORMAL HIGH (ref 0.00–0.07)
Basophils Absolute: 0.1 10*3/uL (ref 0.0–0.1)
Basophils Relative: 1 %
Eosinophils Absolute: 0 10*3/uL (ref 0.0–0.5)
Eosinophils Relative: 0 %
HCT: 39.3 % (ref 39.0–52.0)
Hemoglobin: 13.1 g/dL (ref 13.0–17.0)
Immature Granulocytes: 6 %
Lymphocytes Relative: 24 %
Lymphs Abs: 1.2 10*3/uL (ref 0.7–4.0)
MCH: 33.7 pg (ref 26.0–34.0)
MCHC: 33.3 g/dL (ref 30.0–36.0)
MCV: 101 fL — ABNORMAL HIGH (ref 80.0–100.0)
Monocytes Absolute: 0.5 10*3/uL (ref 0.1–1.0)
Monocytes Relative: 9 %
Neutro Abs: 3.1 10*3/uL (ref 1.7–7.7)
Neutrophils Relative %: 60 %
Platelets: 355 10*3/uL (ref 150–400)
RBC: 3.89 MIL/uL — ABNORMAL LOW (ref 4.22–5.81)
RDW: 14.8 % (ref 11.5–15.5)
WBC: 5.2 10*3/uL (ref 4.0–10.5)
nRBC: 0 % (ref 0.0–0.2)

## 2020-05-14 LAB — D-DIMER, QUANTITATIVE: D-Dimer, Quant: 2.72 ug/mL-FEU — ABNORMAL HIGH (ref 0.00–0.50)

## 2020-05-14 LAB — COMPREHENSIVE METABOLIC PANEL
ALT: 100 U/L — ABNORMAL HIGH (ref 0–44)
AST: 56 U/L — ABNORMAL HIGH (ref 15–41)
Albumin: 2.9 g/dL — ABNORMAL LOW (ref 3.5–5.0)
Alkaline Phosphatase: 87 U/L (ref 38–126)
Anion gap: 9 (ref 5–15)
BUN: 16 mg/dL (ref 6–20)
CO2: 29 mmol/L (ref 22–32)
Calcium: 8.6 mg/dL — ABNORMAL LOW (ref 8.9–10.3)
Chloride: 99 mmol/L (ref 98–111)
Creatinine, Ser: 1.05 mg/dL (ref 0.61–1.24)
GFR, Estimated: >60 mL/min (ref >60)
Glucose, Bld: 141 mg/dL — ABNORMAL HIGH (ref 70–99)
Potassium: 4.6 mmol/L (ref 3.5–5.1)
Sodium: 137 mmol/L (ref 135–145)
Total Bilirubin: 0.2 mg/dL — ABNORMAL LOW (ref 0.3–1.2)
Total Protein: 6.4 g/dL — ABNORMAL LOW (ref 6.5–8.1)

## 2020-05-14 LAB — BRAIN NATRIURETIC PEPTIDE: B Natriuretic Peptide: 27.3 pg/mL (ref 0.0–100.0)

## 2020-05-14 LAB — MAGNESIUM: Magnesium: 2.5 mg/dL — ABNORMAL HIGH (ref 1.7–2.4)

## 2020-05-14 MED ORDER — ALBUTEROL SULFATE HFA 108 (90 BASE) MCG/ACT IN AERS
2.0000 | INHALATION_SPRAY | Freq: Four times a day (QID) | RESPIRATORY_TRACT | 0 refills | Status: DC | PRN
Start: 2020-05-14 — End: 2020-05-14

## 2020-05-14 MED ORDER — TAMSULOSIN HCL 0.4 MG PO CAPS: 0.4000 mg | ORAL_CAPSULE | Freq: Every day | ORAL | 0 refills | Status: DC

## 2020-05-14 MED ORDER — METHYLPREDNISOLONE 4 MG PO TBPK: ORAL_TABLET | ORAL | 0 refills | Status: DC

## 2020-05-14 MED ORDER — ENOXAPARIN (LOVENOX) PATIENT EDUCATION KIT
PACK | Freq: Once | Status: AC
  Filled 2020-05-14: qty 1

## 2020-05-14 MED ORDER — ONDANSETRON HCL 4 MG PO TABS: 4.0000 mg | ORAL_TABLET | Freq: Three times a day (TID) | ORAL | 0 refills | Status: AC | PRN

## 2020-05-14 MED FILL — METHYLPREDNISOLONE 4 MG DOS: 4 | 7 days supply | Qty: 21 | Fill #0

## 2020-05-14 MED FILL — TAMSULOSIN HCL 0.4 MG CAP: 0.4 | 30 days supply | Qty: 30 | Fill #0

## 2020-05-14 MED FILL — PROAIR HFA 90 MCG INHALER: 108 (90 BAS | 1 days supply | Qty: 9 | Fill #0

## 2020-05-14 NOTE — Discharge Summary (Signed)
Kenneth [name]:[id number] DOB: October 30, 1970 DOA: 05/09/2020  PCP: Kenneth Boyden, MD  Admit date: 05/09/2020  Discharge date: 05/14/2020  Admitted From: Home   Disposition:  Home   Recommendations for Outpatient Follow-up:   Follow up with PCP in 1-2 weeks  PCP Please obtain BMP/CBC, 2 view CXR in 1week,  (see Discharge instructions)   PCP Please follow up on the following pending results: Monitor Lovenox dose, needs minimum 6 to 9 months of treatment for DVT PE which most likely should be considered provoked due to intense inflammation from COVID-19, check CBC, CMP and a two-view chest x-ray in a week.   Home Health: PT,RN Equipment/Devices:  2lit o2 if qualifies  Consultations: None  Discharge Condition: Stable    CODE STATUS: Full    Diet Recommendation: Heart Healthy   Diet Order            Diet - low sodium heart healthy           Diet regular Room service appropriate? Yes; Fluid consistency: Thin  Diet effective now                  Chief Complaint  Patient presents with  . hypoxia     Brief history of present illness from the day of admission and additional interim summary    Kenneth [name] a 48 y.o.malewith medical history significant forabdominal discomfort and generalized fatigue, in the ER he was diagnosed with acute hypoxic respiratory failure due to COVID-19 pneumonia and admitted to the hospital.  Unfortunately he is not vaccinated.                                                                 Hospital Course   1. Acute Hypoxic Resp. Failure due to Acute Covid 19 Viral Pneumonitis + PE - he is unfortunately unvaccinated and has incurred moderate parenchymal lung injury, he was treated with IV steroids and Remdesivir combination with good improvement, he was also  anticoagulated for PE, now symptom-free on room air, will get rescue inhaler, discharged home with PCP follow-up, oxygen if he qualifies upon ambulation.  SpO2: 93 % O2 Flow Rate (L/min): 0 L/min  Recent Labs  Lab 05/09/20 1651 05/09/20 1731 05/09/20 1740 05/10/20 0439 05/10/20 0453 05/11/20 0310 05/12/20 0659 05/13/20 0151 05/14/20 0123  WBC   < >  --   --  2.1*  --  2.9* 5.4 4.5 5.2  CRP  --   --   --  14.0*  --  10.3* 5.3* 3.6* 2.4*  DDIMER  --   --   --  7.73*  --  5.46* 3.63* 2.74* 2.72*  BNP  --   --   --  104.3*  --  112.2* 37.4 37.3 27.3  PROCALCITON  --   --   --   --  <  0.10 <0.10 <0.10  --   --   LATICACIDVEN  --   --  1.5  --   --   --   --   --   --   AST   < >  --   --  39  --  66* 38 36 56*  ALT   < >  --   --  37  --  72* 61* 64* 100*  ALKPHOS   < >  --   --  102  --  97 83 91 87  BILITOT   < >  --   --  0.6  --  0.5 0.4 0.6 0.2*  ALBUMIN   < >  --   --  2.6*  --  2.7* 2.6* 2.8* 2.9*  SARSCOV2NAA  --  POSITIVE*  --   --   --   --   --   --   --    < > = values in this interval not displayed.      2.  Urinary retention.  Has condom catheter, had 500 cc on bladder scan, after encouragement was able to clear his bladder, better with Flomax which will be continued on discharge.  PCP to monitor.  [name] require outpatient urology follow-up.  3.  Seizures.  On phenobarbital.  4.  Dyslipidemia.  On statin.  5.  History of Down syndrome.  Supportive care.  6.  Acute PE and acute left lower extremity DVT.  Due to COVID-19 inflammation related hypercoagulability, full dose Lovenox, due to patient being on phenobarbital no good oral substitutes.  We will provide 1 month supply thereafter per PCP.     Discharge diagnosis     Principal Problem:   Acute hypoxemic respiratory failure due to severe acute respiratory syndrome coronavirus 2 (SARS-CoV-2) disease (HCC) Active Problems:   Down syndrome   Neutropenia (HCC)   Vaccine counseling    Discharge  instructions    Discharge Instructions    Diet - low sodium heart healthy   Complete by: As directed    Discharge instructions   Complete by: As directed    Follow with Primary MD Kenneth Boyden, MD in 7 days   Get CBC, CMP, 2 view Chest X ray -  checked next visit within 1 week by Primary MD    Activity: As tolerated with Full fall precautions use walker/cane & assistance as needed  Disposition Home   Diet: Heart Healthy    Special Instructions: If you have smoked or chewed Tobacco  in the last 2 yrs please stop smoking, stop any regular Alcohol  and or any Recreational drug use.  On your next visit with your primary care physician please Get Medicines reviewed and adjusted.  Please request your Prim.MD to go over all Hospital Tests and Procedure/Radiological results at the follow up, please get all Hospital records sent to your Prim MD by signing hospital release before you go home.  If you experience worsening of your admission symptoms, develop shortness of breath, life threatening emergency, suicidal or homicidal thoughts you must seek medical attention immediately by calling 911 or calling your MD immediately  if symptoms less severe.  You Must read complete instructions/literature along with all the possible adverse reactions/side effects for all the Medicines you take and that have been prescribed to you. Take any new Medicines after you have completely understood and accpet all the possible adverse reactions/side effects.   Increase activity slowly   Complete by: As  directed       Discharge Medications   Allergies as of 05/14/2020      Reactions   Amoxicillin-pot Clavulanate Palpitations, Other (See Comments)   "Felt like I was having a heart attack." Has patient had a PCN reaction causing immediate rash, facial/tongue/throat swelling, SOB or lightheadedness with hypotension: No Has patient had a PCN reaction causing severe rash involving mucus membranes or skin  necrosis: No Has patient had a PCN reaction that required hospitalization: No Has patient had a PCN reaction occurring within the last 10 years: No If all of the above answers are "NO", then Durrell proceed with Cephalosporin use. unknown   Penicillins Itching, Rash, Palpitations   Has patient had a PCN reaction causing immediate rash, facial/tongue/throat swelling, SOB or lightheadedness with hypotension: No Has patient had a PCN reaction causing severe rash involving mucus membranes or skin necrosis: No Has patient had a PCN reaction that required hospitalization: Unknown Has patient had a PCN reaction occurring within the last 10 years: No If all of the above answers are "NO", then Bence proceed with Cephalosporin use. "Felt like I was having a heart attack." Has patient had a PCN reaction causing immediate rash, facial/tongue/throat swelling, SOB or lightheadedness with hypotension: No Has patient had a PCN reaction causing severe rash involving mucus membranes or skin necrosis: No Has patient had a PCN reaction that required hospitalization: No Has patient had a PCN reaction occurring within the last 10 years: No If all of the above answers are "NO", then Rufer proceed with Cephalosporin use. Has patient had a PCN reaction causing immediate rash, facial/tongue/throat swelling, SOB or lightheadedness with hypotension: No Has patient had a PCN reaction causing severe rash involving mucus membranes or skin necrosis: No Has patient had a PCN reaction that required hospitalization: Unknown Has patient had a PCN reaction occurring within the last 10 years: No If all of the above answers are "NO", then Oswald proceed with Cephalosporin use.      Medication List    STOP taking these medications   clotrimazole 1 % cream Commonly known as: Lotrimin AF   diclofenac Sodium 1 % Gel Commonly known as: VOLTAREN   Ibuprofen 200 MG Caps     TAKE these medications   albuterol 108 (90 Base) MCG/ACT  inhaler Commonly known as: VENTOLIN HFA Inhale 2 puffs into the lungs every 6 (six) hours as needed for wheezing or shortness of breath.   enoxaparin 80 MG/0.8ML injection Commonly known as: LOVENOX Inject 0.7 mLs (70 mg total) into the skin every 12 (twelve) hours.   gabapentin 100 MG capsule Commonly known as: NEURONTIN TAKE 2 CAPSULES BY MOUTH IN THE MORNING AND 3 CAPSULES IN THE EVENING. (WHITE CAPSULE WITH D 02 OR SG 179) What changed:   how much to take  how to take this  when to take this   methylPREDNISolone 4 MG Tbpk tablet Commonly known as: MEDROL DOSEPAK follow package directions   multivitamin tablet Take 1 tablet by mouth daily.   PHENobarbital 64.8 MG tablet Commonly known as: LUMINAL TAKE FOUR (4) TABLETS BY MOUTH DAILY AT BEDTIME. (ROUND WHITE TABLET WITH E5 757) What changed:   how much to take  how to take this  when to take this   pravastatin 10 MG tablet Commonly known as: PRAVACHOL Take 1 tablet (10 mg total) by mouth daily.   QUEtiapine 300 MG tablet Commonly known as: SEROQUEL TAKE 2 TABLETS BY MOUTH EVERY NIGHT AT BEDTIME (WHITE OBLONG TABLET  WITH 259) What changed: See the new instructions.   tamsulosin 0.4 MG Caps capsule Commonly known as: Flomax Take 1 capsule (0.4 mg total) by mouth daily after supper.   vitamin B-12 500 MCG tablet Commonly known as: CYANOCOBALAMIN Take 500 mcg by mouth daily with breakfast.            Durable Medical Equipment  (From admission, onward)         Start     Ordered   05/14/20 0754  For home use only DME oxygen  Once       Question Answer Comment  Length of Need 6 Months   Mode or (Route) Nasal cannula   Liters per Minute 2   Frequency Continuous (stationary and portable oxygen unit needed)   Oxygen conserving device Yes   Oxygen delivery system Gas      05/14/20 0754           Follow-up Information    Kenneth Boyden, MD. Schedule an appointment as soon as possible for a  visit in 1 week(s).   Specialty: Family Medicine Contact information: [address] [place] [address] [phone]               Major procedures and Radiology Reports - PLEASE review detailed and final reports thoroughly  -       CT ANGIO CHEST PE W OR WO CONTRAST  Result Date: 05/10/2020 CLINICAL DATA:  49 year old male with history of shortness of breath. Suspected pulmonary embolism. EXAM: CT ANGIOGRAPHY CHEST WITH CONTRAST TECHNIQUE: Multidetector CT imaging of the chest was performed using the standard protocol during bolus administration of intravenous contrast. Multiplanar CT image reconstructions and MIPs were obtained to evaluate the vascular anatomy. CONTRAST:  87mL OMNIPAQUE IOHEXOL 350 MG/ML SOLN COMPARISON:  Chest CTA 10/20/2012. FINDINGS: Cardiovascular: Multiple filling defects are noted within the pulmonary arterial tree bilaterally, indicative of widespread pulmonary emboli. This includes distal main, lobar, segmental and subsegmental sized branches in the lungs bilaterally, most of which are nonocclusive, but there are several occlusive filling defects as well. Heart size is borderline enlarged. There is no significant pericardial fluid, thickening or pericardial calcification. No atherosclerotic calcifications in the thoracic aorta or the coronary arteries. Mitral annular calcifications. Mediastinum/Nodes: No pathologically enlarged mediastinal or hilar lymph nodes. Patulous esophagus. No axillary lymphadenopathy. Lungs/Pleura: Patchy multifocal areas of ground-glass attenuation and interstitial prominence are noted throughout the lungs bilaterally, most evident in a peribronchovascular distribution, most severe throughout the mid to upper lungs. No definite pleural effusions. Upper Abdomen: Unremarkable. Musculoskeletal: There are no aggressive appearing lytic or blastic lesions noted in the visualized portions of the skeleton. Review of the MIP images confirms  the above findings. IMPRESSION: 1. Extensive bilateral main, lobar, segmental and subsegmental sized pulmonary emboli. 2. The appearance of the lungs is most suggestive of severe multilobar bilateral pneumonia, presumably from reported COVID-19 infection. However, the possibility that one or more of these areas could represent alveolar hemorrhage from underlying pulmonary infarction is not excluded. Critical Value/emergent results were called by telephone at the time of interpretation on 05/10/2020 at 2:42 pm to provider [facility], who verbally acknowledged these results. Electronically Signed   By: Trudie Reed M.D.   On: 05/10/2020 14:46   DG Chest Portable 1 View  Result Date: 05/09/2020 CLINICAL DATA:  49 year old male with lethargy. EXAM: PORTABLE CHEST 1 VIEW COMPARISON:  Chest radiograph dated 07/27/2017. FINDINGS: Bilateral confluent airspace opacities most consistent with multifocal pneumonia, likely viral or atypical  in etiology including COVID-19. Clinical correlation and follow-up recommended. No pleural effusion or pneumothorax. Stable cardiac silhouette. No acute osseous pathology. IMPRESSION: Multifocal pneumonia. Electronically Signed   By: Elgie Collard M.D.   On: 05/09/2020 18:24   VAS [place] LOWER EXTREMITY VENOUS (DVT)  Result Date: 05/12/2020  Lower Venous DVT Study Indications: Elevated Ddimer.  Risk Factors: COVID 19 positive. Comparison Study: No prior studies. Performing Technologist: Chanda Busing RVT  Examination Guidelines: A complete evaluation includes B-mode imaging, spectral Doppler, color Doppler, and power Doppler as needed of all accessible portions of each vessel. Bilateral testing is considered an integral part of a complete examination. Limited examinations for reoccurring indications [place] be performed as noted. The reflux portion of the exam is performed with the patient in reverse Trendelenburg.   +---------+---------------+---------+-----------+----------+--------------+ RIGHT    CompressibilityPhasicitySpontaneityPropertiesThrombus Aging +---------+---------------+---------+-----------+----------+--------------+ CFV      Full           Yes      Yes                                 +---------+---------------+---------+-----------+----------+--------------+ SFJ      Full                                                        +---------+---------------+---------+-----------+----------+--------------+ FV Prox  Full                                                        +---------+---------------+---------+-----------+----------+--------------+ FV Mid   Full                                                        +---------+---------------+---------+-----------+----------+--------------+ FV DistalFull                                                        +---------+---------------+---------+-----------+----------+--------------+ PFV      Full                                                        +---------+---------------+---------+-----------+----------+--------------+ POP      Full           Yes      Yes                                 +---------+---------------+---------+-----------+----------+--------------+ PTV      Full                                                        +---------+---------------+---------+-----------+----------+--------------+  PERO     Full                                                        +---------+---------------+---------+-----------+----------+--------------+   +---------+---------------+---------+-----------+----------+--------------+ LEFT     CompressibilityPhasicitySpontaneityPropertiesThrombus Aging +---------+---------------+---------+-----------+----------+--------------+ CFV      Full           Yes      Yes                                  +---------+---------------+---------+-----------+----------+--------------+ SFJ      Full                                                        +---------+---------------+---------+-----------+----------+--------------+ FV Prox  Full                                                        +---------+---------------+---------+-----------+----------+--------------+ FV Mid   Full                                                        +---------+---------------+---------+-----------+----------+--------------+ FV DistalFull                                                        +---------+---------------+---------+-----------+----------+--------------+ PFV      Full                                                        +---------+---------------+---------+-----------+----------+--------------+ POP      Full           Yes      Yes                                 +---------+---------------+---------+-----------+----------+--------------+ PTV      Full                                                        +---------+---------------+---------+-----------+----------+--------------+ PERO     Partial                                      Acute          +---------+---------------+---------+-----------+----------+--------------+  Summary: RIGHT: - There is no evidence of deep vein thrombosis in the lower extremity.  - No cystic structure found in the popliteal fossa.  LEFT: - Findings consistent with acute deep vein thrombosis involving the left peroneal veins. - No cystic structure found in the popliteal fossa.  *See table(s) above for measurements and observations. Electronically signed by Gretta Began MD on 05/12/2020 at 3:07:46 PM.    Final     Micro Results    Recent Results (from the past 240 hour(s))  Respiratory Panel by RT PCR (Flu A&B, Covid) - Nasopharyngeal Swab     Status: Abnormal   Collection Time: 05/09/20  5:31 PM   Specimen: Nasopharyngeal Swab    Result Value Ref Range Status   SARS Coronavirus 2 by RT PCR POSITIVE (A) NEGATIVE Final    Comment: RESULT CALLED TO, READ BACK BY AND VERIFIED WITH: RN D SIDBURY [id number] MLM (NOTE) SARS-CoV-2 target nucleic acids are DETECTED.  SARS-CoV-2 RNA is generally detectable in upper respiratory specimens  during the acute phase of infection. Positive results are indicative of the presence of the identified virus, but do not rule out bacterial infection or co-infection with other pathogens not detected by the test. Clinical correlation with patient history and other diagnostic information is necessary to determine patient infection status. The expected result is Negative.  Fact Sheet for Patients:  https://www.moore.com/  Fact Sheet for Healthcare Providers: https://www.young.biz/  This test is not yet approved or cleared by the [place] FDA and  has been authorized for detection and/or diagnosis of SARS-CoV-2 by FDA under an Emergency Use Authorization (EUA).  This EUA will remain in effect (meaning this test can be used)  for the duration of  the COVID-19 declaration under Section 564(b)(1) of the Act, 21 U.S.C. section 360bbb-3(b)(1), unless the authorization is terminated or revoked sooner.      Influenza A by PCR NEGATIVE NEGATIVE Final   Influenza B by PCR NEGATIVE NEGATIVE Final    Comment: (NOTE) The Xpert Xpress SARS-CoV-2/FLU/RSV assay is intended as an aid in  the diagnosis of influenza from Nasopharyngeal swab specimens and  should not be used as a sole basis for treatment. Nasal washings and  aspirates are unacceptable for Xpert Xpress SARS-CoV-2/FLU/RSV  testing.  Fact Sheet for Patients: https://www.moore.com/  Fact Sheet for Healthcare Providers: https://www.young.biz/  This test is not yet approved or cleared by the [place] FDA and  has been authorized for detection  and/or diagnosis of SARS-CoV-2 by  FDA under an Emergency Use Authorization (EUA). This EUA will remain  in effect (meaning this test can be used) for the duration of the  Covid-19 declaration under Section 564(b)(1) of the Act, 21  U.S.C. section 360bbb-3(b)(1), unless the authorization is  terminated or revoked. Performed at [facility], [address] [place], [place] [address]     Today   Subjective    Reyhan Sass today has no headache,no chest abdominal pain,no new weakness tingling or numbness, feels much better wants to go home today.    Objective   Blood pressure 115/85, pulse 66, temperature 98 F (36.7 C), resp. rate 18, height  (1.626 m), weight 74 kg, SpO2 93 %.   Intake/Output Summary (Last 24 hours) at 05/14/2020 1033 Last data filed at 05/13/2020 2249 Gross per 24 hour  Intake 240 ml  Output 1423 ml  Net -1183 ml    Exam  Awake Alert, No new F.N deficits,   [place].AT,PERRAL Supple  Neck,No JVD, No cervical lymphadenopathy appriciated.  Symmetrical Chest wall movement, Good air movement bilaterally, CTAB RRR,No Gallops,Rubs or new Murmurs, No Parasternal Heave +ve B.Sounds, Abd Soft, Non tender, No organomegaly appriciated, No rebound -guarding or rigidity. No Cyanosis, Clubbing or edema, No new Rash or bruise   Data Review   CBC w Diff:  Lab Results  Component Value Date   WBC 5.2 05/14/2020   HGB 13.1 05/14/2020   HCT 39.3 05/14/2020   PLT 355 05/14/2020   LYMPHOPCT 24 05/14/2020   MONOPCT 9 05/14/2020   EOSPCT 0 05/14/2020   BASOPCT 1 05/14/2020    CMP:  Lab Results  Component Value Date   NA 137 05/14/2020   K 4.6 05/14/2020   CL 99 05/14/2020   CO2 29 05/14/2020   BUN 16 05/14/2020   CREATININE 1.05 05/14/2020   CREATININE 1.12 03/31/2011   PROT 6.4 (L) 05/14/2020   ALBUMIN 2.9 (L) 05/14/2020   BILITOT 0.2 (L) 05/14/2020   ALKPHOS 87 05/14/2020   AST 56 (H) 05/14/2020   ALT 100 (H) 05/14/2020  .   Total Time in  preparing paper work, data evaluation and todays exam - 35 minutes  Susa RaringPrashant Vinette Crites M.D on 05/14/2020 at 10:33 AM  [facility]   Office  [phone]

## 2020-05-14 NOTE — Discharge Instructions (Signed)
Follow with Primary MD Eustaquio Boyden, MD in 7 days   Get CBC, CMP, 2 view Chest X ray -  checked next visit within 1 week by Primary MD    Activity: As tolerated with Full fall precautions use walker/cane & assistance as needed  Disposition Home   Diet: Heart Healthy    Special Instructions: If you have smoked or chewed Tobacco  in the last 2 yrs please stop smoking, stop any regular Alcohol  and or any Recreational drug use.  On your next visit with your primary care physician please Get Medicines reviewed and adjusted.  Please request your Prim.MD to go over all Hospital Tests and Procedure/Radiological results at the follow up, please get all Hospital records sent to your Prim MD by signing hospital release before you go home.  If you experience worsening of your admission symptoms, develop shortness of breath, life threatening emergency, suicidal or homicidal thoughts you must seek medical attention immediately by calling 911 or calling your MD immediately  if symptoms less severe.  You Must read complete instructions/literature along with all the possible adverse reactions/side effects for all the Medicines you take and that have been prescribed to you. Take any new Medicines after you have completely understood and accpet all the possible adverse reactions/side effects.       Person Under Monitoring Name: Kenneth [name]  Location: [address] [place] [address]   Infection Prevention Recommendations for Individuals Confirmed to have, or Being Evaluated for, 2019 Novel Coronavirus (COVID-19) Infection Who Receive Care at Home  Individuals who are confirmed to have, or are being evaluated for, COVID-19 should follow the prevention steps below until a healthcare provider or local or state health department says they can return to normal activities.  Stay home except to get medical care You should restrict activities outside your home, except for getting medical care.  Do not go to work, school, or public areas, and do not use public transportation or taxis.  Call ahead before visiting your doctor Before your medical appointment, call the healthcare provider and tell them that you have, or are being evaluated for, COVID-19 infection. This will help the healthcare provider's office take steps to keep other people from getting infected. Ask your healthcare provider to call the local or state health department.  Monitor your symptoms Seek prompt medical attention if your illness is worsening (e.g., difficulty breathing). Before going to your medical appointment, call the healthcare provider and tell them that you have, or are being evaluated for, COVID-19 infection. Ask your healthcare provider to call the local or state health department.  Wear a facemask You should wear a facemask that covers your nose and mouth when you are in the same room with other people and when you visit a healthcare provider. People who live with or visit you should also wear a facemask while they are in the same room with you.  Separate yourself from other people in your home As much as possible, you should stay in a different room from other people in your home. Also, you should use a separate bathroom, if available.  Avoid sharing household items You should not share dishes, drinking glasses, cups, eating utensils, towels, bedding, or other items with other people in your home. After using these items, you should wash them thoroughly with soap and water.  Cover your coughs and sneezes Cover your mouth and nose with a tissue when you cough or sneeze, or you can cough or sneeze  into your sleeve. Throw used tissues in a lined trash can, and immediately wash your hands with soap and water for at least 20 seconds or use an alcohol-based hand rub.  Wash your [facility] your hands often and thoroughly with soap and water for at least 20 seconds. You can use an alcohol-based  hand sanitizer if soap and water are not available and if your hands are not visibly dirty. Avoid touching your eyes, nose, and mouth with unwashed hands.   Prevention Steps for Caregivers and Household Members of Individuals Confirmed to have, or Being Evaluated for, COVID-19 Infection Being Cared for in the Home  If you live with, or provide care at home for, a person confirmed to have, or being evaluated for, COVID-19 infection please follow these guidelines to prevent infection:  Follow healthcare provider's instructions Make sure that you understand and can help the patient follow any healthcare provider instructions for all care.  Provide for the patient's basic needs You should help the patient with basic needs in the home and provide support for getting groceries, prescriptions, and other personal needs.  Monitor the patient's symptoms If they are getting sicker, call his or her medical provider and tell them that the patient has, or is being evaluated for, COVID-19 infection. This will help the healthcare provider's office take steps to keep other people from getting infected. Ask the healthcare provider to call the local or state health department.  Limit the number of people who have contact with the patient  If possible, have only one caregiver for the patient.  Other household members should stay in another home or place of residence. If this is not possible, they should stay  in another room, or be separated from the patient as much as possible. Use a separate bathroom, if available.  Restrict visitors who do not have an essential need to be in the home.  Keep older adults, very young children, and other sick people away from the patient Keep older adults, very young children, and those who have compromised immune systems or chronic health conditions away from the patient. This includes people with chronic heart, lung, or kidney conditions, diabetes, and  cancer.  Ensure good ventilation Make sure that shared spaces in the home have good air flow, such as from an air conditioner or an opened window, weather permitting.  Wash your hands often  Wash your hands often and thoroughly with soap and water for at least 20 seconds. You can use an alcohol based hand sanitizer if soap and water are not available and if your hands are not visibly dirty.  Avoid touching your eyes, nose, and mouth with unwashed hands.  Use disposable paper towels to dry your hands. If not available, use dedicated cloth towels and replace them when they become wet.  Wear a facemask and gloves  Wear a disposable facemask at all times in the room and gloves when you touch or have contact with the patient's blood, body fluids, and/or secretions or excretions, such as sweat, saliva, sputum, nasal mucus, vomit, urine, or feces.  Ensure the mask fits over your nose and mouth tightly, and do not touch it during use.  Throw out disposable facemasks and gloves after using them. Do not reuse.  Wash your hands immediately after removing your facemask and gloves.  If your personal clothing becomes contaminated, carefully remove clothing and launder. Wash your hands after handling contaminated clothing.  Place all used disposable facemasks, gloves, and other waste in  a lined container before disposing them with other household waste.  Remove gloves and wash your hands immediately after handling these items.  Do not share dishes, glasses, or other household items with the patient  Avoid sharing household items. You should not share dishes, drinking glasses, cups, eating utensils, towels, bedding, or other items with a patient who is confirmed to have, or being evaluated for, COVID-19 infection.  After the person uses these items, you should wash them thoroughly with soap and water.  Wash laundry thoroughly  Immediately remove and wash clothes or bedding that have blood, body  fluids, and/or secretions or excretions, such as sweat, saliva, sputum, nasal mucus, vomit, urine, or feces, on them.  Wear gloves when handling laundry from the patient.  Read and follow directions on labels of laundry or clothing items and detergent. In general, wash and dry with the warmest temperatures recommended on the label.  Clean all areas the individual has used often  Clean all touchable surfaces, such as counters, tabletops, doorknobs, bathroom fixtures, toilets, phones, keyboards, tablets, and bedside tables, every day. Also, clean any surfaces that [place] have blood, body fluids, and/or secretions or excretions on them.  Wear gloves when cleaning surfaces the patient has come in contact with.  Use a diluted bleach solution (e.g., dilute bleach with 1 part bleach and 10 parts water) or a household disinfectant with a label that says EPA-registered for coronaviruses. To make a bleach solution at home, add 1 tablespoon of bleach to 1 quart (4 cups) of water. For a larger supply, add  cup of bleach to 1 gallon (16 cups) of water.  Read labels of cleaning products and follow recommendations provided on product labels. Labels contain instructions for safe and effective use of the cleaning product including precautions you should take when applying the product, such as wearing gloves or eye protection and making sure you have good ventilation during use of the product.  Remove gloves and wash hands immediately after cleaning.  Monitor yourself for signs and symptoms of illness Caregivers and household members are considered close contacts, should monitor their health, and will be asked to limit movement outside of the home to the extent possible. Follow the monitoring steps for close contacts listed on the symptom monitoring form.   ? If you have additional questions, contact your local health department or call the epidemiologist on call at [phone] (available 24/7). ? This  guidance is subject to change. For the most up-to-date guidance from [facility], please refer to their website: [email or web address]

## 2020-05-14 NOTE — Discharge Planning (Signed)
Nsg Discharge Note  Admit Date:  05/09/2020 Discharge date: 05/14/2020   Kenneth [name] to be D/C'd Home per MD order.  AVS completed.     Discharge Medication: Allergies as of 05/14/2020      Reactions   Amoxicillin-pot Clavulanate Palpitations, Other (See Comments)   "Felt like I was having a heart attack." Has patient had a PCN reaction causing immediate rash, facial/tongue/throat swelling, SOB or lightheadedness with hypotension: No Has patient had a PCN reaction causing severe rash involving mucus membranes or skin necrosis: No Has patient had a PCN reaction that required hospitalization: No Has patient had a PCN reaction occurring within the last 10 years: No If all of the above answers are "NO", then Kenneth [name] proceed with Cephalosporin use. unknown   Penicillins Itching, Rash, Palpitations   Has patient had a PCN reaction causing immediate rash, facial/tongue/throat swelling, SOB or lightheadedness with hypotension: No Has patient had a PCN reaction causing severe rash involving mucus membranes or skin necrosis: No Has patient had a PCN reaction that required hospitalization: Unknown Has patient had a PCN reaction occurring within the last 10 years: No If all of the above answers are "NO", then Kenneth [name] proceed with Cephalosporin use. "Felt like I was having a heart attack." Has patient had a PCN reaction causing immediate rash, facial/tongue/throat swelling, SOB or lightheadedness with hypotension: No Has patient had a PCN reaction causing severe rash involving mucus membranes or skin necrosis: No Has patient had a PCN reaction that required hospitalization: No Has patient had a PCN reaction occurring within the last 10 years: No If all of the above answers are "NO", then Kenneth [name] proceed with Cephalosporin use. Has patient had a PCN reaction causing immediate rash, facial/tongue/throat swelling, SOB or lightheadedness with hypotension: No Has patient had a PCN reaction causing severe rash  involving mucus membranes or skin necrosis: No Has patient had a PCN reaction that required hospitalization: Unknown Has patient had a PCN reaction occurring within the last 10 years: No If all of the above answers are "NO", then Kenneth [name] proceed with Cephalosporin use.      Medication List    STOP taking these medications   clotrimazole 1 % cream Commonly known as: Lotrimin AF   diclofenac Sodium 1 % Gel Commonly known as: VOLTAREN   Ibuprofen 200 MG Caps     TAKE these medications   albuterol 108 (90 Base) MCG/ACT inhaler Commonly known as: VENTOLIN HFA Inhale 2 puffs into the lungs every 6 (six) hours as needed for wheezing or shortness of breath.   enoxaparin 80 MG/0.8ML injection Commonly known as: LOVENOX Inject 0.7 mLs (70 mg total) into the skin every 12 (twelve) hours.   gabapentin 100 MG capsule Commonly known as: NEURONTIN TAKE 2 CAPSULES BY MOUTH IN THE MORNING AND 3 CAPSULES IN THE EVENING. (WHITE CAPSULE WITH D 02 OR SG 179) What changed:   how much to take  how to take this  when to take this   methylPREDNISolone 4 MG Tbpk tablet Commonly known as: MEDROL DOSEPAK follow package directions   multivitamin tablet Take 1 tablet by mouth daily.   ondansetron 4 MG tablet Commonly known as: Zofran Take 1 tablet (4 mg total) by mouth every 8 (eight) hours as needed for nausea or vomiting.   PHENobarbital 64.8 MG tablet Commonly known as: LUMINAL TAKE FOUR (4) TABLETS BY MOUTH DAILY AT BEDTIME. (ROUND WHITE TABLET WITH E5 757) What changed:   how much to take  how to  take this  when to take this   pravastatin 10 MG tablet Commonly known as: PRAVACHOL Take 1 tablet (10 mg total) by mouth daily.   QUEtiapine 300 MG tablet Commonly known as: SEROQUEL TAKE 2 TABLETS BY MOUTH EVERY NIGHT AT BEDTIME (WHITE OBLONG TABLET WITH 259) What changed: See the new instructions.   tamsulosin 0.4 MG Caps capsule Commonly known as: Flomax Take 1 capsule (0.4 mg  total) by mouth daily after supper.   vitamin B-12 500 MCG tablet Commonly known as: CYANOCOBALAMIN Take 500 mcg by mouth daily with breakfast.            Durable Medical Equipment  (From admission, onward)         Start     Ordered   05/14/20 0754  For home use only DME oxygen  Once       Question Answer Comment  Length of Need 6 Months   Mode or (Route) Nasal cannula   Liters per Minute 2   Frequency Continuous (stationary and portable oxygen unit needed)   Oxygen conserving device Yes   Oxygen delivery system Gas      05/14/20 0754          Discharge Assessment: Vitals:   05/13/20 2016 05/14/20 0440  BP: 133/82 115/85  Pulse: 72 66  Resp: 15 18  Temp: 98.1 F (36.7 C) 98 F (36.7 C)  SpO2: 91% 93%   Skin clean, dry and intact without evidence of skin break down, no evidence of skin tears noted. IV catheter discontinued intact. Site without signs and symptoms of complications - no redness or edema noted at insertion site, patient denies c/o pain - only slight tenderness at site.  Dressing with slight pressure applied.  D/c Instructions-Education: Discharge instructions given to patient/family with verbalized understanding. D/c education completed with patient/family including follow up instructions, medication list, d/c activities limitations if indicated, with other d/c instructions as indicated by MD - patient able to verbalize understanding, all questions fully answered. Patient instructed to return to ED, call 911, or call MD for any changes in condition.  Patient escorted via WC, and D/C home via private auto.  Lyndal Pulley, RN 05/14/2020 2:13 PM

## 2020-05-14 NOTE — Telephone Encounter (Signed)
Transition Care Management Follow-up Telephone Call  Date of discharge and from where: 05/14/2020, [facility]  How have you been since you were released from the hospital? Spoke with sister Selena Batten) (HIPAA verified) and she states that patient is doing okay.   Any questions or concerns? No  Items Reviewed:  Did the pt receive and understand the discharge instructions provided? Yes   Medications obtained and verified? Yes   Other? N/A  Any new allergies since your discharge? No   Dietary orders reviewed? Yes  Do you have support at home? Yes   Home Care and Equipment/Supplies: Were home health services ordered? not applicable If so, what is the name of the agency? N/A  Has the agency set up a time to come to the patient's home? not applicable Were any new equipment or medical supplies ordered?  No What is the name of the medical supply agency? N/A Were you able to get the supplies/equipment? not applicable Do you have any questions related to the use of the equipment or supplies? No  Functional Questionnaire: (I = Independent and D = Dependent) ADLs: I  Bathing/Dressing- I  Meal Prep- I  Eating- I  Maintaining continence- I  Transferring/Ambulation- I  Managing Meds- I  Follow up appointments reviewed:   PCP Hospital f/u appt confirmed? Yes  Scheduled to see Dr. Sharen Hones on 05/31/2020 @ 2 pm.  Specialist Hospital f/u appt confirmed? N/A   Are transportation arrangements needed? No   If their condition worsens, is the pt aware to call PCP or go to the Emergency Dept.? Yes  Was the patient provided with contact information for the PCP's office or ED? Yes  Was to pt encouraged to call back with questions or concerns? Yes

## 2020-05-14 NOTE — Progress Notes (Signed)
SATURATION QUALIFICATIONS: (This note is used to comply with regulatory documentation for home oxygen)  Patient Saturations on Room Air at Rest = 95%  Patient Saturations on Room Air while Ambulating = 92%  Patient Saturations on 2 Liters of oxygen while Ambulating = not tested; not did not desaturate on room air

## 2020-05-14 NOTE — Progress Notes (Signed)
Physical Therapy Treatment Patient Details Name: Kenneth [name] MRN: [id number] DOB: [birth year] Today's Date: 05/14/2020    History of Present Illness Pt is a 49 y.o. male presenting 05/09/20 with reported weakness per caregiver. Tested COVID-19 positive; not vaccinated. CT angio with extensive bilateral PE. (+) LLE DVT on 10/13. PMH includes Down syndrome, seizures, congenital heart defect, acute pulmonary edema, osteoarthritis, avascular necrosis, bilateral cataracts, agitation, HLD.   PT Comments    Pt slowly progressing with mobility. Today's session focused on transfer and gait training, pt requiring intermittent assist to prevent LOB. Pt reports feeling "dizzy" a few times, but difficult to determine extent of symptoms; post-ambulation BP 102-74. Also with difficulty getting reliable pulse ox read, at times down to 69% with poor pleth on RA; when reliable pleth via ear probe, reading 91-97% on RA; pt asymptomatic, with no overt SOB noted. Pt preparing to d/c home today with 24/7 assist from family; reviewed education with pt's sister/caregiver.    Follow Up Recommendations  Home health PT;Supervision/Assistance - 24 hour     Equipment Recommendations  None recommended by PT    Recommendations for Other Services       Precautions / Restrictions Precautions Precautions: Fall Restrictions Weight Bearing Restrictions: No    Mobility  Bed Mobility Overal bed mobility: Needs Assistance Bed Mobility: Supine to Sit     Supine to sit: Supervision;HOB elevated     General bed mobility comments: Increased time and effort. Pt reports, "I can't" but ultimately able to complete without assist with encouragement to perform as independently as possible  Transfers Overall transfer level: Needs assistance Equipment used: None Transfers: Sit to/from Stand Sit to Stand: Min guard;Min assist         General transfer comment: Multiple sit<>stands throughout session from bed, recliner  and BSC, pt fluctuating from needing min guard to minA for trunk elevation and maintain balance; frequent enouragement to perform as independently as possible  Ambulation/Gait Ambulation/Gait assistance: Min guard;Min assist Gait Distance (Feet): 80 Feet Assistive device: None;1 person hand held assist Gait Pattern/deviations: Step-through pattern;Shuffle;Trunk flexed Gait velocity: Decreased Gait velocity interpretation: <1.31 ft/sec, indicative of household ambulator General Gait Details: Slow, shuffling steps into hallway with intermittent minA to prevent LOB, pt reaching for UE support on rail/furniture/wall with significant lean outside of BOS to do so; cues to correct this, but pt difficult to redirect to attempt walking without UE support. 2x c/o feeling "dizzy" with multiple LOB. Post-ambulation BP 102/74   Stairs             Wheelchair Mobility    Modified Rankin (Stroke Patients Only)       Balance Overall balance assessment: Needs assistance Sitting-balance support: No upper extremity supported;Feet supported Sitting balance-Leahy Scale: Good     Standing balance support: Bilateral upper extremity supported;During functional activity Standing balance-Leahy Scale: Fair Standing balance comment: Can static stand and take steps without UE support, min guard; able to perform posterior pericare without UE support after using BSC                            Cognition Arousal/Alertness: Awake/alert Behavior During Therapy: Flat affect Overall Cognitive Status: History of cognitive impairments - at baseline                                 General Comments: H/o Down's Syndrome. Minimal verbalizations, but talking  more throughout session and with encouragement. Frequent encouragement to complete tasks as independently as possible; following majority of one step commands well with increased time      Exercises Other Exercises Other Exercises:  Reviewed activity recommendations with pt and sister Selena Batten), including walking program and LE therex    General Comments General comments (skin integrity, edema, etc.): Difficult getting reliable pulse ox reading via ear probe. SpO2 showing down to 69% on RA with poor pleth, pt asymptomatic. When good pleth, SpO2 91-97% on RA      Pertinent Vitals/Pain Pain Assessment: Faces Faces Pain Scale: No hurt Pain Intervention(s): Monitored during session    Home Living                      Prior Function            PT Goals (current goals can now be found in the care plan section) Acute Rehab PT Goals Patient Stated Goal: "Get home to my toys" PT Goal Formulation: With patient/family Progress towards PT goals: Progressing toward goals    Frequency    Min 3X/week      PT Plan Current plan remains appropriate    Co-evaluation              AM-PAC PT "6 Clicks" Mobility   Outcome Measure  Help needed turning from your back to your side while in a flat bed without using bedrails?: A Little Help needed moving from lying on your back to sitting on the side of a flat bed without using bedrails?: A Little Help needed moving to and from a bed to a chair (including a wheelchair)?: A Little Help needed standing up from a chair using your arms (e.g., wheelchair or bedside chair)?: A Little Help needed to walk in hospital room?: A Little Help needed climbing 3-5 steps with a railing? : A Lot 6 Click Score: 17    End of Session Equipment Utilized During Treatment: Gait belt Activity Tolerance: Patient tolerated treatment well Patient left: in chair;with call bell/phone within reach;with family/visitor present Nurse Communication: Mobility status PT Visit Diagnosis: Other abnormalities of gait and mobility (R26.89);Muscle weakness (generalized) (M62.81)     Time: 7591-6384 PT Time Calculation (min) (ACUTE ONLY): 28 min  Charges:  $Gait Training: 8-22 mins $Therapeutic  Activity: 8-22 mins                     Ina Homes, PT, DPT Acute Rehabilitation Services  Pager [phone] Office [phone]  Malachy Chamber 05/14/2020, 10:54 AM

## 2020-05-14 NOTE — TOC Progression Note (Signed)
Transition of Care [facility]) - Progression Note    Patient Details  Name: Kenneth [name] MRN: [id number] Date of Birth: [birth year]  Transition of Care [facility]) CM/SW Contact  Lockie Pares, RN Phone Number: 05/14/2020, 8:34 AM  Clinical Narrative:    Called to [facility] to place a eliquis card on patients chart   Expected Discharge Plan: Home w Home Health Services Barriers to Discharge: Continued Medical Work up  Expected Discharge Plan and Services Expected Discharge Plan: Home w Home Health Services   Discharge Planning Services: CM Consult Post Acute Care Choice: Home Health Living arrangements for the past 2 months: Single Family Home                                       Social Determinants of Health (SDOH) Interventions    Readmission Risk Interventions No flowsheet data found.

## 2020-05-14 NOTE — TOC Transition Note (Signed)
Transition of Care [facility]) - CM/SW Discharge Note   Patient Details  Name: Jaisean Monteforte Rabago MRN: [id number] Date of Birth: [birth year]  Transition of Care [facility]) CM/SW Contact:  Lockie Pares, RN Phone Number: 05/14/2020, 9:51 AM   Clinical Narrative:    Spoke to sister who was at bedside with patient (phone). Discussed lovenox co-pay that [place] be needed. She expressed that MD had ordered oxygen, but patient does not qualify for oxygen according to qualifications done earlier. She stated that her dropped when ambulating longer periods. She also requested something for nausea for home use, as he seemed to get nauseous during the afternoons. Requested Rn to do walking saturations for longer period, and requested Dr Thedore Mins to order antiemetic from [facility] pharmacy. Called TOC pharmacy and made them aware that any co-pay, the sister would pay and that she is in the room with the patient.    Final next level of care: Home w Home Health Services Barriers to Discharge: Continued Medical Work up   Patient Goals and CMS Choice   CMS Medicare.gov Compare Post Acute Care list provided to:: Legal Guardian Choice offered to / list presented to : [facility] POA / Guardian  Discharge Placement                       Discharge Plan and Services   Discharge Planning Services: CM Consult Post Acute Care Choice: Home Health                               Social Determinants of Health (SDOH) Interventions     Readmission Risk Interventions No flowsheet data found.

## 2020-05-14 NOTE — Care Management Important Message (Signed)
Important Message  Patient Details  Name: Kenneth [name] MRN: [id number] Date of Birth: 1971-05-24   Medicare Important Message Given:  Yes - Important Message mailed due to current National Emergency  Verbal consent obtained due to current National Emergency  Relationship to patient: Brother/Sister Contact Name: Damien Fusi Call Date: 05/14/20  Time: 1209 Phone: [phone] Outcome: Spoke with contact Important Message mailed to: Patient address on file    [name] 05/14/2020, 12:09 PM

## 2020-05-19 ENCOUNTER — Encounter (HOSPITAL_COMMUNITY): Payer: Self-pay | Admitting: Emergency Medicine

## 2020-05-19 ENCOUNTER — Telehealth: Payer: Self-pay

## 2020-05-19 ENCOUNTER — Other Ambulatory Visit: Payer: Self-pay

## 2020-05-19 ENCOUNTER — Emergency Department (HOSPITAL_COMMUNITY)
Admission: EM | Admit: 2020-05-19 | Discharge: 2020-05-20 | Disposition: A | Discharge status: Home / Self Care | Payer: 59 | Attending: Emergency Medicine | Admitting: Emergency Medicine

## 2020-05-19 DIAGNOSIS — R109 Unspecified abdominal pain: Secondary | ICD-10-CM | POA: Diagnosis not present

## 2020-05-19 DIAGNOSIS — Z8616 Personal history of COVID-19: Secondary | ICD-10-CM | POA: Insufficient documentation

## 2020-05-19 DIAGNOSIS — Z79899 Other long term (current) drug therapy: Secondary | ICD-10-CM | POA: Insufficient documentation

## 2020-05-19 DIAGNOSIS — R040 Epistaxis: Secondary | ICD-10-CM | POA: Diagnosis present

## 2020-05-19 DIAGNOSIS — R110 Nausea: Secondary | ICD-10-CM | POA: Insufficient documentation

## 2020-05-19 NOTE — ED Triage Notes (Signed)
Patient is nonverbal, can follow commands.  Patient having abdominal pain, bleeding from his nose.  He is on Lovenox for blood clot in lung and leg.  Patient is actively vomiting in triage.  Patient has not had his lovenox tonight.

## 2020-05-19 NOTE — Telephone Encounter (Signed)
He needs to continue lovenox shots given recent DVT/PE.  Recommend nasal saline to nose and Frith try vaseline in nares, let us know if ongoing bleeding.

## 2020-05-19 NOTE — Telephone Encounter (Signed)
Patient was discharged from hospital on Friday with covid. He is currently on Lovenox injections. States that just now patient started having Dark Pink blood trickling from nose. It has stopped but when she looks in his nose she can see dry blood. Scheduled for next injection tonight at 8pm.

## 2020-05-20 ENCOUNTER — Emergency Department (HOSPITAL_COMMUNITY): Payer: 59

## 2020-05-20 LAB — URINALYSIS, ROUTINE W REFLEX MICROSCOPIC
Bilirubin Urine: NEGATIVE
Glucose, UA: NEGATIVE mg/dL
Hgb urine dipstick: NEGATIVE
Ketones, ur: NEGATIVE mg/dL
Leukocytes,Ua: NEGATIVE
Nitrite: NEGATIVE
Protein, ur: NEGATIVE mg/dL
Specific Gravity, Urine: 1.024 (ref 1.005–1.030)
pH: 6 (ref 5.0–8.0)

## 2020-05-20 LAB — COMPREHENSIVE METABOLIC PANEL
ALT: 62 U/L — ABNORMAL HIGH (ref 0–44)
AST: 29 U/L (ref 15–41)
Albumin: 3.1 g/dL — ABNORMAL LOW (ref 3.5–5.0)
Alkaline Phosphatase: 100 U/L (ref 38–126)
Anion gap: 9 (ref 5–15)
BUN: 23 mg/dL — ABNORMAL HIGH (ref 6–20)
CO2: 28 mmol/L (ref 22–32)
Calcium: 8.6 mg/dL — ABNORMAL LOW (ref 8.9–10.3)
Chloride: 103 mmol/L (ref 98–111)
Creatinine, Ser: 1.02 mg/dL (ref 0.61–1.24)
GFR, Estimated: >60 mL/min (ref >60)
Glucose, Bld: 110 mg/dL — ABNORMAL HIGH (ref 70–99)
Potassium: 4.8 mmol/L (ref 3.5–5.1)
Sodium: 140 mmol/L (ref 135–145)
Total Bilirubin: 0.5 mg/dL (ref 0.3–1.2)
Total Protein: 6.5 g/dL (ref 6.5–8.1)

## 2020-05-20 LAB — CBC WITH DIFFERENTIAL/PLATELET
Abs Immature Granulocytes: 0.18 10*3/uL — ABNORMAL HIGH (ref 0.00–0.07)
Basophils Absolute: 0.1 10*3/uL (ref 0.0–0.1)
Basophils Relative: 2 %
Eosinophils Absolute: 0 10*3/uL (ref 0.0–0.5)
Eosinophils Relative: 0 %
HCT: 38.1 % — ABNORMAL LOW (ref 39.0–52.0)
Hemoglobin: 12.4 g/dL — ABNORMAL LOW (ref 13.0–17.0)
Immature Granulocytes: 4 %
Lymphocytes Relative: 34 %
Lymphs Abs: 1.6 10*3/uL (ref 0.7–4.0)
MCH: 34.3 pg — ABNORMAL HIGH (ref 26.0–34.0)
MCHC: 32.5 g/dL (ref 30.0–36.0)
MCV: 105.2 fL — ABNORMAL HIGH (ref 80.0–100.0)
Monocytes Absolute: 0.6 10*3/uL (ref 0.1–1.0)
Monocytes Relative: 13 %
Neutro Abs: 2.1 10*3/uL (ref 1.7–7.7)
Neutrophils Relative %: 47 %
Platelets: 295 10*3/uL (ref 150–400)
RBC: 3.62 MIL/uL — ABNORMAL LOW (ref 4.22–5.81)
RDW: 16.1 % — ABNORMAL HIGH (ref 11.5–15.5)
WBC: 4.6 10*3/uL (ref 4.0–10.5)
nRBC: 0 % (ref 0.0–0.2)

## 2020-05-20 LAB — LIPASE, BLOOD: Lipase: 24 U/L (ref 11–51)

## 2020-05-20 MED ORDER — IOHEXOL 300 MG/ML  SOLN
100.0000 mL | Freq: Once | INTRAMUSCULAR | Status: AC | PRN
  Administered 2020-05-20: 100 mL via INTRAVENOUS

## 2020-05-20 MED ORDER — FENTANYL CITRATE (PF) 100 MCG/2ML IJ SOLN
50.0000 ug | Freq: Once | INTRAMUSCULAR | Status: AC
  Administered 2020-05-20: 50 ug via INTRAVENOUS
  Filled 2020-05-20: qty 2

## 2020-05-20 MED ORDER — ONDANSETRON HCL 4 MG/2ML IJ SOLN
4.0000 mg | Freq: Once | INTRAMUSCULAR | Status: AC
  Administered 2020-05-20: 4 mg via INTRAVENOUS
  Filled 2020-05-20: qty 2

## 2020-05-20 NOTE — Discharge Instructions (Addendum)
You were seen in the ER today for abdominal pain, nausea, and a nose bleed.   Your labs appear similar to prior labs you have had done.  Your CT scan showed findings likely related to your covid 19 as well as fatty liver changes & moderate stool in the colon- if you are constipated please take miralax per over the counter dosing.   Please continue zofran as prescribed per discharge instructions from your recent hospital stay.  Please follow attached diet guidelines.   As discussed if nose starts to re-bleed, apply pressure as we demonstrated, if this persists after 20 minutes of pressure or is uncontrollable or if you have any other concerns return to the ER.   Follow up with primary care within 3 days. Return to the Er for any new or worsening symptoms including but not limited to fever, trouble breathing, passing out, return or worsening pain, return of nosebleed, dizziness, or any other concerns.

## 2020-05-20 NOTE — Telephone Encounter (Signed)
Spoke with pt sister, Cala Bradford (on dpr), relaying Dr. Timoteo Expose message.  She verbalizes understanding.

## 2020-05-20 NOTE — ED Provider Notes (Signed)
[facility]   CSN: [id number] Arrival date & time: 05/19/20  2249     History Chief Complaint  Patient presents with  . Epistaxis  . Abdominal Pain    Duke SalviaRichard M Lyday is a 49 y.o. male with a history of Down syndrome, hyperlipidemia, seizure disorder, congenital heart defect, and recent hospital admission for COVID-19 with VTE who presents to the emergency department for evaluation of epistaxis that began around 1800 this evening as well as abdominal pain.  Patient is fairly nonverbal at baseline, level 5 caveat applies.  History is primarily provided by the patient's sister who states that tonight the patient was sitting on the couch when she noted that he had some blood coming from the R nare, she looked up his nose and there was dark blood present. The patient felt nauseous and was complaining of abdominal pain. Given the combination of sxs they decided to come to the ED. Currently epistaxis & nausea resolved, patient does not yes when asked if he is having abdominal pain. Per his sister she has been giving the patient his lovenox shots in his abdomen & he had 3 cheeseburgers & ice cream for dinner, however this is the first time he has complained of pain. She has not noted any fevers, chest pain, or increased work of breathing.   HPI     Past Medical History:  Diagnosis Date  . Cataracts, bilateral   . Congenital heart defect   . COVID-19 virus infection 04/2020   COVID PNA s/p hospitalization  . Down syndrome   . HLD (hyperlipidemia)   . Seizure disorder [facility](HCC)     Patient Active Problem List   Diagnosis Date Noted  . Acute hypoxemic respiratory failure due to severe acute respiratory syndrome coronavirus 2 (SARS-CoV-2) disease (HCC) 05/09/2020  . Neutropenia (HCC) 05/09/2020  . Vaccine counseling 05/09/2020  . Agitation 01/29/2020  . Bilateral cataracts 07/28/2019  . Left foot pain 07/09/2019  . Pes planus 07/09/2019  .  Tinea pedis 07/09/2019  . 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 Localization-related symptomatic epilepsy and epileptic syndromes with complex partial seizures, not intractable, without status epilepticus (HCC) 12/22/2016  . Hearing trouble, bilateral 11/16/2016  . 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 DDD (degenerative disc disease), lumbar 08/08/2016  . Constipation 08/08/2016  . Acute pulmonary edema (HCC) 12/15/2015  . Advanced care planning/counseling discussion 07/13/2014  . Medicare annual wellness visit, subsequent 07/11/2013  . Down syndrome   . Seizure disorder (HCC)   . Vitamin B12 deficiency 04/22/2010  . HLD (hyperlipidemia) 02/07/2007    History reviewed. No pertinent surgical history.     Family History  Problem Relation Age of Onset  . Diabetes Mother   . Hypertension Mother        Cerebral hemorrhage  . Diabetes Father   . Hypertension Father   . Coronary artery disease Father 1155       MI  . Stroke Father   . Cancer Maternal Grandmother        Oral  . Cancer Maternal Aunt        Colon    Social History   Tobacco Use  . Smoking status: Never Smoker  . Smokeless tobacco: Never Used  Vaping Use  . Vaping Use: Never used  Substance Use Topics  . Alcohol use: No  . Drug use: No    Home Medications Prior to Admission medications  Medication Sig Start Date End Date Taking? Authorizing Provider  albuterol (VENTOLIN HFA) 108 (90 Base) MCG/ACT inhaler Inhale 2 puffs into the lungs every 6 (six) hours as needed for wheezing or shortness of breath. 05/14/20   Leroy Sea, MD  enoxaparin (LOVENOX) 80 MG/0.8ML injection Inject 0.7 mLs (70 mg total) into the skin every 12 (twelve) hours. 05/12/20   Leroy Sea, MD  gabapentin (NEURONTIN) 100 MG capsule TAKE 2 CAPSULES BY MOUTH IN THE MORNING AND 3 CAPSULES IN THE EVENING. (WHITE CAPSULE WITH D 02 OR SG 179) Patient taking differently: Take  200-300 mg by mouth See admin instructions. TAKE 2 CAPSULES BY MOUTH IN THE MORNING AND 3 CAPSULES IN THE EVENING. (WHITE CAPSULE WITH D 02 OR SG 179) 04/04/20   Van Clines, MD  methylPREDNISolone (MEDROL DOSEPAK) 4 MG TBPK tablet follow package directions 05/14/20   Leroy Sea, MD  Multiple Vitamin (MULTIVITAMIN) tablet Take 1 tablet by mouth daily.    [provider]  ondansetron (ZOFRAN) 4 MG tablet Take 1 tablet (4 mg total) by mouth every 8 (eight) hours as needed for nausea or vomiting. 05/14/20   Leroy Sea, MD  PHENobarbital (LUMINAL) 64.8 MG tablet TAKE FOUR (4) TABLETS BY MOUTH DAILY AT BEDTIME. (ROUND WHITE TABLET WITH E5 757) Patient taking differently: Take 259.2 mg by mouth at bedtime. TAKE FOUR (4) TABLETS BY MOUTH DAILY AT BEDTIME. (ROUND WHITE TABLET WITH E5 757) 04/02/20   Van Clines, MD  pravastatin (PRAVACHOL) 10 MG tablet Take 1 tablet (10 mg total) by mouth daily. 01/29/20   Eustaquio Boyden, MD  QUEtiapine (SEROQUEL) 300 MG tablet TAKE 2 TABLETS BY MOUTH EVERY NIGHT AT BEDTIME (WHITE OBLONG TABLET WITH 259) Patient taking differently: Take 600 mg by mouth at bedtime.  03/01/20   Eustaquio Boyden, MD  tamsulosin (FLOMAX) 0.4 MG CAPS capsule Take 1 capsule (0.4 mg total) by mouth daily after supper. 05/14/20   Leroy Sea, MD  vitamin B-12 (CYANOCOBALAMIN) 500 MCG tablet Take 500 mcg by mouth daily with breakfast.     [provider]    Allergies    Amoxicillin-pot clavulanate and Penicillins  Review of Systems   Review of Systems  Unable to perform ROS: Patient nonverbal  HENT: Positive for nosebleeds.   Respiratory: Negative for shortness of breath.   Gastrointestinal: Positive for abdominal pain and nausea.  Neurological: Negative for syncope.    Physical Exam Updated Vital Signs BP 105/69   Pulse 79   Temp 97.9 F (36.6 C)   Resp 16   SpO2 97%   Physical Exam Vitals and nursing note reviewed.  Constitutional:       General: He is not in acute distress.    Appearance: He is well-developed. He is not toxic-appearing.  HENT:     Head: Normocephalic and atraumatic.     Nose:     Comments: Patient has dried blood in the right nare.  No septal hematoma.  No active bleeding. Eyes:     General:        Right eye: No discharge.        Left eye: No discharge.     Extraocular Movements: Extraocular movements intact.     Conjunctiva/sclera: Conjunctivae normal.     Pupils: Pupils are equal, round, and reactive to light.  Cardiovascular:     Rate and Rhythm: Normal rate and regular rhythm.  Pulmonary:     Effort: Pulmonary effort is normal. No respiratory  distress.     Breath sounds: Normal breath sounds. No wheezing, rhonchi or rales.  Abdominal:     General: There is no distension.     Palpations: Abdomen is soft.     Tenderness: There is abdominal tenderness in the right lower quadrant and left lower quadrant. There is no guarding or rebound.     Comments: Small areas of ecchymosis to abdomen consistent w/ lovenox injections  Musculoskeletal:     Cervical back: Neck supple.  Skin:    General: Skin is warm and dry.     Findings: No rash.  Neurological:     Mental Status: He is alert.     Comments: Clear speech.   Psychiatric:        Behavior: Behavior normal.    ED Results / Procedures / Treatments   Labs (all labs ordered are listed, but only abnormal results are displayed) Labs Reviewed  COMPREHENSIVE METABOLIC PANEL - Abnormal; Notable for the following components:      Result Value   Glucose, Bld 110 (*)    BUN 23 (*)    Calcium 8.6 (*)    Albumin 3.1 (*)    ALT 62 (*)    All other components within normal limits  CBC WITH DIFFERENTIAL/PLATELET - Abnormal; Notable for the following components:   RBC 3.62 (*)    Hemoglobin 12.4 (*)    HCT 38.1 (*)    MCV 105.2 (*)    MCH 34.3 (*)    RDW 16.1 (*)    Abs Immature Granulocytes 0.18 (*)    All other components within normal limits    LIPASE, BLOOD  URINALYSIS, ROUTINE W REFLEX MICROSCOPIC    EKG None  Radiology CT Abdomen Pelvis W Contrast  Result Date: 05/20/2020 CLINICAL DATA:  Acute abdominal pain. Vomiting. Bleeding from nose. EXAM: CT ABDOMEN AND PELVIS WITH CONTRAST TECHNIQUE: Multidetector CT imaging of the abdomen and pelvis was performed using the standard protocol following bolus administration of intravenous contrast. CONTRAST:  OMNIPAQUE IOHEXOL 300 MG/ML  SOLN COMPARISON:  07/27/2017 FINDINGS: Lower chest: Motion artifact limits the examination. Suggestion of patchy airspace disease in the lung bases, possibly multifocal pneumonia or edema. Cardiac enlargement with particular prominence of the right heart. Hepatobiliary: Diffuse fatty infiltration of the liver. No focal liver lesions. Gallbladder and bile ducts are unremarkable. Pancreas: Unremarkable. No pancreatic ductal dilatation or surrounding inflammatory changes. Spleen: Normal in size without focal abnormality. Adrenals/Urinary Tract: Adrenal glands are unremarkable. Kidneys are normal, without renal calculi, focal lesion, or hydronephrosis. Bladder wall is diffusely thickened, likely cystitis. Stomach/Bowel: Stomach, small bowel, and colon are not abnormally distended. The colon is diffusely stool-filled, possibly indicating constipation. No inflammatory changes. Appendix is not identified. Vascular/Lymphatic: No significant vascular findings are present. No enlarged abdominal or pelvic lymph nodes. Reproductive: Prostate is unremarkable. Other: No free air or free fluid in the abdomen. Abdominal wall musculature appears intact. Small gas collections demonstrated in the anterior abdominal wall subcutaneous fat, likely representing injection sites. Musculoskeletal: Degenerative changes in the spine. No destructive bone lesions. IMPRESSION: 1. Suggestion of patchy airspace disease in the lung bases, possibly multifocal pneumonia or edema. Cardiac  enlargement with particular prominence of the right heart. 2. Diffuse fatty infiltration of the liver. 3. Diffusely stool-filled colon, possibly indicating constipation. No evidence of bowel obstruction or inflammation. 4. Thick-walled bladder, likely cystitis. Electronically Signed   By: Burman Nieves M.D.   On: 05/20/2020 04:21    Procedures Procedures (including critical care time)  Medications Ordered in ED Medications  iohexol (OMNIPAQUE) 300 MG/ML solution 100 mL (100 mLs Intravenous Contrast Given 05/20/20 0357)  ondansetron (ZOFRAN) injection 4 mg (4 mg Intravenous Given 05/20/20 0313)  fentaNYL (SUBLIMAZE) injection 50 mcg (50 mcg Intravenous Given 05/20/20 0314)    ED Course  I have reviewed the triage vital signs and the nursing notes.  Pertinent labs & imaging results that were available during my care of the patient were reviewed by me and considered in my medical decision making (see chart for details).    MDM Rules/Calculators/A&P                         Patient presents to the ED with complaints of epistaxis as well as abdominal pain. Patient is nontoxic, resting comfortably, vitals are within normal limits.  Epistaxis is resolved at this time, no septal hematoma, no active bleeding- we discussed instructions for management should re-bleed occur at home and reasons to return to the ED.  Abdominal exam with lower abdominal tenderness to palpation without peritoneal signs.  Additional history obtained:  Additional history obtained from chart review and patient's sister. Previous records obtained and reviewed-patient was admitted to the hospital 10/10 through 10/15-he presented with abdominal discomfort and fatigue found to have acute hypoxic respiratory failure in the setting of Covid pneumonia and was admitted to the hospital, he was found to have pulmonary embolism,  Received tx w/ IV steroids & remdesivir, anticoagulated for PE- discharged home with PCP follow up.    Lab Tests:  I reviewed and interpreted labs, which included:  CBC: Mild anemia similar to prior ranges.  No leukocytosis. CMP: Similar to prior. Lipase: Within normal limits Urinalysis: Unremarkable  Patient became nauseated @ CT, had to return to room, medicated w/ improvement.   Imaging Studies ordered:  I ordered imaging studies which included CT abdomen/pelvis, I independently visualized and interpreted imaging which showed:  1. Suggestion of patchy airspace disease in the lung bases, possibly multifocal pneumonia or edema--- consistent w/ recent COVID 19 diagnosis/admission. Cardiac enlargement with particular prominence of the right heart.  2. Diffuse fatty infiltration of the liver.  3. Diffusely stool-filled colon, possibly indicating constipation. No evidence of bowel obstruction or inflammation.  4. Thick-walled bladder, likely cystitis- UA unremarkable   No hematoma or internal bleeding on CT imaging.  Patient feeling much better, tolerating PO- has zofran to take at home as needed as he was having N/V during his admission for COVID per family report. Repeat abdominal exam without peritoneal signs, unclear definitive cause of abdominal pain, still Hinze be covid related, also possibly related to large dinner w/ 3 cheeseburgers and icecream- diet recommendations discussed. No re-occurrence of epistaxis. Will discharge at this time. I discussed results, treatment plan, need for follow-up, and return precautions with the patient & his sister. Provided opportunity for questions, they confirmed understanding and are in agreement with plan.   This is a shared visit with supervising physician Dr. Nicanor Alcon who has independently evaluated patient & provided guidance in care & is in agreement  Portions of this note were generated with Dragon dictation software. Dictation errors Paparella occur despite best attempts at proofreading.  Final Clinical Impression(s) / ED Diagnoses Final diagnoses:   Epistaxis  Nausea  Abdominal pain, unspecified abdominal location    Rx / DC Orders ED Discharge Orders    None       Cherly Anderson, PA-C 05/20/20 0240    Palumbo, April, MD  05/20/20 0648  

## 2020-05-31 ENCOUNTER — Other Ambulatory Visit: Payer: Self-pay

## 2020-05-31 ENCOUNTER — Ambulatory Visit (INDEPENDENT_AMBULATORY_CARE_PROVIDER_SITE_OTHER): Payer: 59 | Admitting: Family Medicine

## 2020-05-31 ENCOUNTER — Encounter: Payer: Self-pay | Admitting: Family Medicine

## 2020-05-31 ENCOUNTER — Ambulatory Visit (INDEPENDENT_AMBULATORY_CARE_PROVIDER_SITE_OTHER)
Admission: RE | Admit: 2020-05-31 | Discharge: 2020-05-31 | Disposition: A | Discharge status: Home / Self Care | Payer: 59 | Source: Ambulatory Visit | Attending: Family Medicine | Admitting: Family Medicine

## 2020-05-31 VITALS — BP 120/64 | HR 69 | Temp 97.5°F | Ht 63.0 in | Wt 161.2 lb

## 2020-05-31 DIAGNOSIS — Q909 Down syndrome, unspecified: Secondary | ICD-10-CM | POA: Diagnosis not present

## 2020-05-31 DIAGNOSIS — U071 COVID-19: Secondary | ICD-10-CM | POA: Diagnosis not present

## 2020-05-31 DIAGNOSIS — I2699 Other pulmonary embolism without acute cor pulmonale: Secondary | ICD-10-CM

## 2020-05-31 DIAGNOSIS — G40909 Epilepsy, unspecified, not intractable, without status epilepticus: Secondary | ICD-10-CM | POA: Diagnosis not present

## 2020-05-31 DIAGNOSIS — J1282 Pneumonia due to coronavirus disease 2019: Secondary | ICD-10-CM

## 2020-05-31 DIAGNOSIS — K5900 Constipation, unspecified: Secondary | ICD-10-CM

## 2020-05-31 NOTE — Progress Notes (Signed)
This visit was conducted in person.  BP 120/64 (BP Location: Left Arm, Patient Position: Sitting, Cuff Size: Normal)   Pulse 69   Temp (!) 97.5 F (36.4 C) (Temporal)   Ht 5\' 3"  (1.6 m)   Wt 161 lb 4 oz (73.1 kg)   SpO2 97%   BMI 28.56 kg/m    CC: hosp f/u visit  Subjective:    Patient ID: Kenneth [name], male    DOB: [birth year], 49 y.o.   MRN: [id number]  HPI: Kenneth [name] is a 49 y.o. male presenting on 05/31/2020 for Hospitalization Follow-up (Seen at [facility] ED on 05/19/20. Pt accomapanied by sister, 05/21/20- temp 97.7.)   Recent hospitalization for abd pain, fatigue, found to have COVID19 pneumonia complicated by pulmonary embolism (extensive bilateral main, lobar, segmental and subsegmental sized pulmonary emboli). Treated with IV steroids and Remdesivir. Anticoagulated with full dose lovenox 70mg  BID to complete 6-9 months of therapy. Lovenox chosen due to phenobarbital interaction. Found to have L peroneal vein DVT on ultrasound.   Rest of family did test positive as well but none had significant symptoms.   Had some trouble with urinary retention - discharged on flomax. Continues this - no significant trouble urinating. CT at last ER visit showed thickened bladder ?cystitis however UA was clear at that time. No dysuria, urgency frequency.   Seen subsequently at ER for epistaxis which did resolve without intervention.   Since home overall feeling well, tolerating lovenox. No noted chest pain, dyspnea. He does stay more fatigued than normal - reviewed this is expected post-COVID. Taking tapering miralax for constipation incidentally found on imaging. Normal bowel movements reported.   _____________________________________________________ Admit date: 05/09/2020  Discharge date: 05/14/2020  TCM hosp f/u phone call completed on 05/14/2020   Admitted From: Home  Disposition:  Home Recommendations for Outpatient Follow-up:   Follow up with PCP in 1-2 weeks PCP Please  obtain BMP/CBC, 2 view CXR in 1week,  (see Discharge instructions)  PCP Please follow up on the following pending results: Monitor Lovenox dose, needs minimum 6 to 9 months of treatment for DVT PE which most likely should be considered provoked due to intense inflammation from COVID-19, check CBC, CMP and a two-view chest x-ray in a week.  Home Health: PT,RN Equipment/Devices:  2lit o2 if qualifies  Consultations: None  Discharge Condition: Stable    CODE STATUS: Full    Diet Recommendation: Heart Healthy   D/C Dx: Principal Problem:   Acute hypoxemic respiratory failure due to severe acute respiratory syndrome coronavirus 2 (SARS-CoV-2) disease (HCC) Active Problems:   Down syndrome   Neutropenia (HCC)   Vaccine counseling     Relevant past medical, surgical, family and social history reviewed and updated as indicated. Interim medical history since our last visit reviewed. Allergies and medications reviewed and updated. Outpatient Medications Prior to Visit  Medication Sig Dispense Refill  . enoxaparin (LOVENOX) 80 MG/0.8ML injection Inject 0.7 mLs (70 mg total) into the skin every 12 (twelve) hours. 144 mL 0  . gabapentin (NEURONTIN) 100 MG capsule TAKE 2 CAPSULES BY MOUTH IN THE MORNING AND 3 CAPSULES IN THE EVENING. (WHITE CAPSULE WITH D 02 OR SG 179) (Patient taking differently: Take 200-300 mg by mouth See admin instructions. TAKE 2 CAPSULES BY MOUTH IN THE MORNING AND 3 CAPSULES IN THE EVENING. (WHITE CAPSULE WITH D 02 OR SG 179)) 155 capsule 11  . Multiple Vitamin (MULTIVITAMIN) tablet Take 1 tablet by mouth daily.    . ondansetron (  ZOFRAN) 4 MG tablet Take 1 tablet (4 mg total) by mouth every 8 (eight) hours as needed for nausea or vomiting. 20 tablet 0  . PHENobarbital (LUMINAL) 64.8 MG tablet TAKE FOUR (4) TABLETS BY MOUTH DAILY AT BEDTIME. (ROUND WHITE TABLET WITH E5 757) (Patient taking differently: Take 259.2 mg by mouth at bedtime. TAKE FOUR (4) TABLETS BY MOUTH DAILY AT  BEDTIME. (ROUND WHITE TABLET WITH E5 757)) 124 tablet 5  . pravastatin (PRAVACHOL) 10 MG tablet Take 1 tablet (10 mg total) by mouth daily. 31 tablet 11  . QUEtiapine (SEROQUEL) 300 MG tablet TAKE 2 TABLETS BY MOUTH EVERY NIGHT AT BEDTIME (WHITE OBLONG TABLET WITH 259) (Patient taking differently: Take 600 mg by mouth at bedtime. ) 62 tablet 3  . tamsulosin (FLOMAX) 0.4 MG CAPS capsule Take 1 capsule (0.4 mg total) by mouth daily after supper. 30 capsule 0  . vitamin B-12 (CYANOCOBALAMIN) 500 MCG tablet Take 500 mcg by mouth daily with breakfast.     . albuterol (VENTOLIN HFA) 108 (90 Base) MCG/ACT inhaler Inhale 2 puffs into the lungs every 6 (six) hours as needed for wheezing or shortness of breath. (Patient not taking: Reported on 05/31/2020) 6.7 g 0  . methylPREDNISolone (MEDROL DOSEPAK) 4 MG TBPK tablet follow package directions (Patient taking differently: Take 4 mg by mouth See admin instructions. follow package directions, tapered dose) 21 tablet 0   No facility-administered medications prior to visit.     Per HPI unless specifically indicated in ROS section below Review of Systems Objective:  BP 120/64 (BP Location: Left Arm, Patient Position: Sitting, Cuff Size: Normal)   Pulse 69   Temp (!) 97.5 F (36.4 C) (Temporal)   Ht 5\' 3"  (1.6 m)   Wt 161 lb 4 oz (73.1 kg)   SpO2 97%   BMI 28.56 kg/m   Wt Readings from Last 3 Encounters:  05/31/20 161 lb 4 oz (73.1 kg)  05/20/20 165 lb (74.8 kg)  05/09/20 163 lb 2.3 oz (74 kg)      Physical Exam Vitals and nursing note reviewed.  Constitutional:      Appearance: Normal appearance. He is not ill-appearing.  Cardiovascular:     Rate and Rhythm: Normal rate and regular rhythm.     Pulses: Normal pulses.     Heart sounds: Normal heart sounds. No murmur heard.   Pulmonary:     Effort: Pulmonary effort is normal. No respiratory distress.     Breath sounds: Normal breath sounds. No wheezing, rhonchi or rales.  Abdominal:      General: Bowel sounds are normal. There is no distension.     Palpations: Abdomen is soft. There is no mass.     Tenderness: There is no abdominal tenderness. There is no guarding or rebound.     Hernia: No hernia is present.     Comments: Mild bruising from lovenox injections  Musculoskeletal:     Right lower leg: No edema.     Left lower leg: No edema.  Neurological:     Mental Status: He is alert.  Psychiatric:        Mood and Affect: Mood normal.        Behavior: Behavior normal.       Results for orders placed or performed in visit on 05/31/20  Comprehensive metabolic panel  Result Value Ref Range   Sodium 138 135 - 145 mEq/L   Potassium 4.3 3.5 - 5.1 mEq/L   Chloride 101 96 - 112 mEq/L  CO2 31 19 - 32 mEq/L   Glucose, Bld 88 70 - 99 mg/dL   BUN 17 6 - 23 mg/dL   Creatinine, Ser 5.36 0.40 - 1.50 mg/dL   Total Bilirubin 0.3 0.2 - 1.2 mg/dL   Alkaline Phosphatase 136 (H) 39 - 117 U/L   AST 38 (H) 0 - 37 U/L   ALT 96 (H) 0 - 53 U/L   Total Protein 7.4 6.0 - 8.3 g/dL   Albumin 4.0 3.5 - 5.2 g/dL   GFR 64.40 >34.74 mL/min   Calcium 9.1 8.4 - 10.5 mg/dL  CBC with Differential/Platelet  Result Value Ref Range   WBC 3.6 (L) 4.0 - 10.5 K/uL   RBC 3.84 (L) 4.22 - 5.81 Mil/uL   Hemoglobin 13.4 13.0 - 17.0 g/dL   HCT 25.9 39 - 52 %   MCV 103.2 (H) 78.0 - 100.0 fl   MCHC 33.8 30.0 - 36.0 g/dL   RDW 56.3 (H) 87.5 - 64.3 %   Platelets 336.0 150 - 400 K/uL   Neutrophils Relative % 42.8 (L) 43 - 77 %   Lymphocytes Relative 39.0 12 - 46 %   Monocytes Relative 14.7 (H) 3 - 12 %   Eosinophils Relative 1.9 0 - 5 %   Basophils Relative 1.6 0 - 3 %   Neutro Abs 1.5 1.4 - 7.7 K/uL   Lymphs Abs 1.4 0.7 - 4.0 K/uL   Monocytes Absolute 0.5 0.1 - 1.0 K/uL   Eosinophils Absolute 0.1 0.0 - 0.7 K/uL   Basophils Absolute 0.1 0.0 - 0.1 K/uL   DG Chest 2 View CLINICAL DATA:  COVID pneumonia, follow-up examination  EXAM: CHEST - 2 VIEW  COMPARISON:  05/09/2020  FINDINGS: The lungs  are well expanded. Previously noted patchy airspace infiltrate has largely resolved. Mild nonspecific right pleural thickening noted. No pneumothorax or pleural effusion. Cardiac size within normal limits. The pulmonary vascularity is normal. No acute bone abnormality.  IMPRESSION: Resolved pulmonary infiltrates. Nonspecific right pleural thickening,.  Electronically Signed   By: Helyn Numbers MD   On: 06/01/2020 19:40   Assessment & Plan:  This visit occurred during the SARS-CoV-2 public health emergency.  Safety protocols were in place, including screening questions prior to the visit, additional usage of staff PPE, and extensive cleaning of exam room while observing appropriate contact time as indicated for disinfecting solutions.   Problem List Items Addressed This Visit    Seizure disorder (HCC)    Continues phenobarbital and gabapentin.  Drug interactions limit novel oral anticoagulant agents.       Pneumonia due to COVID-19 virus - Primary    Recent hospitalization for COVID pneumonia, sister reports he didn't have significant respiratory symptoms on presentation. Received IV steroid and remdesivir in hospital. Complicated by extensive PE now on lovenox - continue this.  Check labs today as well as CXR.       Relevant Orders   Comprehensive metabolic panel (Completed)   CBC with Differential/Platelet (Completed)   DG Chest 2 View (Completed)   Down syndrome   Constipation    Reviewed miralax dosing.       Acute pulmonary embolism (HCC)    Given extensive nature of PE, would lean towards 6 months of anticoagulant treatment vs 3 (which would suffice for provoked PE). I will ask them to return in 3 months instead of 5 for close monitoring.  Continue full therapeutic dosing of lovenox 70mg  BID (1mg /kg bid). AC limited by drug interactions.  Good O2  sats today, vitals stable.           No orders of the defined types were placed in this encounter.  Orders Placed This  Encounter  Procedures  . DG Chest 2 View    Standing Status:   Future    Number of Occurrences:   1    Standing Expiration Date:   05/31/2021    Order Specific Question:   Reason for Exam (SYMPTOM  OR DIAGNOSIS REQUIRED)    Answer:   f/u covid PNA    Order Specific Question:   Preferred imaging location?    Answer:   [facility]  . Comprehensive metabolic panel  . CBC with Differential/Platelet    Patient Instructions  Labs today Chest xray today  Continue flomax for the next week then trial off, let us know if urinary symptoms return.  Continue lovenox shots twice daily for 5-->3 months, return for recheck at that time.   Follow up plan: Return in about 3 months (around 08/31/2020), or if symptoms worsen or fail to improve, for follow up visit.  Eustaquio Boyden, MD

## 2020-05-31 NOTE — Patient Instructions (Addendum)
Labs today Chest xray today  Continue flomax for the next week then trial off, let us know if urinary symptoms return.  Continue lovenox shots twice daily for 5-->3 months, return for recheck at that time.

## 2020-06-01 LAB — CBC WITH DIFFERENTIAL/PLATELET
Basophils Absolute: 0.1 10*3/uL (ref 0.0–0.1)
Basophils Relative: 1.6 % (ref 0.0–3.0)
Eosinophils Absolute: 0.1 10*3/uL (ref 0.0–0.7)
Eosinophils Relative: 1.9 % (ref 0.0–5.0)
HCT: 39.6 % (ref 39.0–52.0)
Hemoglobin: 13.4 g/dL (ref 13.0–17.0)
Lymphocytes Relative: 39 % (ref 12.0–46.0)
Lymphs Abs: 1.4 10*3/uL (ref 0.7–4.0)
MCHC: 33.8 g/dL (ref 30.0–36.0)
MCV: 103.2 fl — ABNORMAL HIGH (ref 78.0–100.0)
Monocytes Absolute: 0.5 10*3/uL (ref 0.1–1.0)
Monocytes Relative: 14.7 % — ABNORMAL HIGH (ref 3.0–12.0)
Neutro Abs: 1.5 10*3/uL (ref 1.4–7.7)
Neutrophils Relative %: 42.8 % — ABNORMAL LOW (ref 43.0–77.0)
Platelets: 336 10*3/uL (ref 150.0–400.0)
RBC: 3.84 Mil/uL — ABNORMAL LOW (ref 4.22–5.81)
RDW: 15.7 % — ABNORMAL HIGH (ref 11.5–15.5)
WBC: 3.6 10*3/uL — ABNORMAL LOW (ref 4.0–10.5)

## 2020-06-01 LAB — COMPREHENSIVE METABOLIC PANEL
ALT: 96 U/L — ABNORMAL HIGH (ref 0–53)
AST: 38 U/L — ABNORMAL HIGH (ref 0–37)
Albumin: 4 g/dL (ref 3.5–5.2)
Alkaline Phosphatase: 136 U/L — ABNORMAL HIGH (ref 39–117)
BUN: 17 mg/dL (ref 6–23)
CO2: 31 mEq/L (ref 19–32)
Calcium: 9.1 mg/dL (ref 8.4–10.5)
Chloride: 101 mEq/L (ref 96–112)
Creatinine, Ser: 1.1 mg/dL (ref 0.40–1.50)
GFR: 79.12 mL/min (ref >60.00)
Glucose, Bld: 88 mg/dL (ref 70–99)
Potassium: 4.3 mEq/L (ref 3.5–5.1)
Sodium: 138 mEq/L (ref 135–145)
Total Bilirubin: 0.3 mg/dL (ref 0.2–1.2)
Total Protein: 7.4 g/dL (ref 6.0–8.3)

## 2020-06-02 ENCOUNTER — Encounter: Payer: Self-pay | Admitting: Family Medicine

## 2020-06-02 DIAGNOSIS — I2699 Other pulmonary embolism without acute cor pulmonale: Secondary | ICD-10-CM | POA: Insufficient documentation

## 2020-06-02 NOTE — Assessment & Plan Note (Addendum)
Recent hospitalization for COVID pneumonia, sister reports he didn't have significant respiratory symptoms on presentation. Received IV steroid and remdesivir in hospital. Complicated by extensive PE now on lovenox - continue this.  Check labs today as well as CXR.

## 2020-06-02 NOTE — Assessment & Plan Note (Signed)
Continues phenobarbital and gabapentin.  Drug interactions limit novel oral anticoagulant agents.

## 2020-06-02 NOTE — Assessment & Plan Note (Addendum)
Given extensive nature of PE, would lean towards 6 months of anticoagulant treatment vs 3 (which would suffice for provoked PE). I will ask them to return in 3 months instead of 5 for close monitoring.  Continue full therapeutic dosing of lovenox 70mg  BID (1mg /kg bid). AC limited by drug interactions.  Good O2 sats today, vitals stable.

## 2020-06-02 NOTE — Assessment & Plan Note (Signed)
Reviewed miralax dosing.

## 2020-06-30 ENCOUNTER — Other Ambulatory Visit: Payer: Self-pay | Admitting: Family Medicine

## 2020-06-30 NOTE — Telephone Encounter (Signed)
Seroquel Last filled:  06/30/20, #62 Last OV:  05/31/20, hosp f/u Next OV:  08/02/20, AWV prt 2

## 2020-07-05 ENCOUNTER — Emergency Department (HOSPITAL_COMMUNITY): Payer: 59

## 2020-07-05 ENCOUNTER — Inpatient Hospital Stay (HOSPITAL_COMMUNITY)
Admission: EM | Admit: 2020-07-05 | Discharge: 2020-07-31 | DRG: 064 | Disposition: E | Payer: 59 | Attending: Internal Medicine | Admitting: Internal Medicine

## 2020-07-05 DIAGNOSIS — E785 Hyperlipidemia, unspecified: Secondary | ICD-10-CM | POA: Diagnosis present

## 2020-07-05 DIAGNOSIS — G935 Compression of brain: Secondary | ICD-10-CM | POA: Diagnosis present

## 2020-07-05 DIAGNOSIS — Q909 Down syndrome, unspecified: Secondary | ICD-10-CM | POA: Diagnosis not present

## 2020-07-05 DIAGNOSIS — J690 Pneumonitis due to inhalation of food and vomit: Secondary | ICD-10-CM | POA: Diagnosis present

## 2020-07-05 DIAGNOSIS — R4189 Other symptoms and signs involving cognitive functions and awareness: Secondary | ICD-10-CM

## 2020-07-05 DIAGNOSIS — R092 Respiratory arrest: Secondary | ICD-10-CM | POA: Diagnosis present

## 2020-07-05 DIAGNOSIS — Z515 Encounter for palliative care: Secondary | ICD-10-CM | POA: Diagnosis not present

## 2020-07-05 DIAGNOSIS — Z8616 Personal history of COVID-19: Secondary | ICD-10-CM | POA: Diagnosis not present

## 2020-07-05 DIAGNOSIS — J9601 Acute respiratory failure with hypoxia: Secondary | ICD-10-CM | POA: Diagnosis present

## 2020-07-05 DIAGNOSIS — G40909 Epilepsy, unspecified, not intractable, without status epilepticus: Secondary | ICD-10-CM | POA: Diagnosis present

## 2020-07-05 DIAGNOSIS — R4020 Unspecified coma: Secondary | ICD-10-CM | POA: Diagnosis present

## 2020-07-05 DIAGNOSIS — I615 Nontraumatic intracerebral hemorrhage, intraventricular: Secondary | ICD-10-CM | POA: Diagnosis present

## 2020-07-05 DIAGNOSIS — R001 Bradycardia, unspecified: Secondary | ICD-10-CM | POA: Diagnosis not present

## 2020-07-05 DIAGNOSIS — I10 Essential (primary) hypertension: Secondary | ICD-10-CM | POA: Diagnosis not present

## 2020-07-05 DIAGNOSIS — Z66 Do not resuscitate: Secondary | ICD-10-CM | POA: Diagnosis present

## 2020-07-05 DIAGNOSIS — R404 Transient alteration of awareness: Secondary | ICD-10-CM | POA: Diagnosis not present

## 2020-07-05 DIAGNOSIS — Z86718 Personal history of other venous thrombosis and embolism: Secondary | ICD-10-CM

## 2020-07-05 DIAGNOSIS — I619 Nontraumatic intracerebral hemorrhage, unspecified: Secondary | ICD-10-CM

## 2020-07-05 DIAGNOSIS — Z86711 Personal history of pulmonary embolism: Secondary | ICD-10-CM | POA: Diagnosis not present

## 2020-07-05 DIAGNOSIS — R0689 Other abnormalities of breathing: Secondary | ICD-10-CM | POA: Diagnosis not present

## 2020-07-05 LAB — COMPREHENSIVE METABOLIC PANEL
ALT: 23 U/L (ref 0–44)
AST: 22 U/L (ref 15–41)
Albumin: 3.7 g/dL (ref 3.5–5.0)
Alkaline Phosphatase: 100 U/L (ref 38–126)
Anion gap: 12 (ref 5–15)
BUN: 17 mg/dL (ref 6–20)
CO2: 24 mmol/L (ref 22–32)
Calcium: 8.5 mg/dL — ABNORMAL LOW (ref 8.9–10.3)
Chloride: 103 mmol/L (ref 98–111)
Creatinine, Ser: 0.97 mg/dL (ref 0.61–1.24)
GFR, Estimated: >60 mL/min (ref >60)
Glucose, Bld: 166 mg/dL — ABNORMAL HIGH (ref 70–99)
Potassium: 3.7 mmol/L (ref 3.5–5.1)
Sodium: 139 mmol/L (ref 135–145)
Total Bilirubin: 0.6 mg/dL (ref 0.3–1.2)
Total Protein: 6.8 g/dL (ref 6.5–8.1)

## 2020-07-05 LAB — I-STAT VENOUS BLOOD GAS, ED
Acid-Base Excess: 0 mmol/L (ref 0.0–2.0)
Bicarbonate: 26.9 mmol/L (ref 20.0–28.0)
Calcium, Ion: 1.08 mmol/L — ABNORMAL LOW (ref 1.15–1.40)
HCT: 42 % (ref 39.0–52.0)
Hemoglobin: 14.3 g/dL (ref 13.0–17.0)
O2 Saturation: 88 %
Potassium: 3.6 mmol/L (ref 3.5–5.1)
Sodium: 138 mmol/L (ref 135–145)
TCO2: 28 mmol/L (ref 22–32)
pCO2, Ven: 51.5 mmHg (ref 44.0–60.0)
pH, Ven: 7.327 (ref 7.250–7.430)
pO2, Ven: 59 mmHg — ABNORMAL HIGH (ref 32.0–45.0)

## 2020-07-05 LAB — CBC WITH DIFFERENTIAL/PLATELET
Abs Immature Granulocytes: 0.04 10*3/uL (ref 0.00–0.07)
Basophils Absolute: 0.1 10*3/uL (ref 0.0–0.1)
Basophils Relative: 1 %
Eosinophils Absolute: 0 10*3/uL (ref 0.0–0.5)
Eosinophils Relative: 0 %
HCT: 42.1 % (ref 39.0–52.0)
Hemoglobin: 14.2 g/dL (ref 13.0–17.0)
Immature Granulocytes: 1 %
Lymphocytes Relative: 14 %
Lymphs Abs: 1.2 10*3/uL (ref 0.7–4.0)
MCH: 35.7 pg — ABNORMAL HIGH (ref 26.0–34.0)
MCHC: 33.7 g/dL (ref 30.0–36.0)
MCV: 105.8 fL — ABNORMAL HIGH (ref 80.0–100.0)
Monocytes Absolute: 0.5 10*3/uL (ref 0.1–1.0)
Monocytes Relative: 6 %
Neutro Abs: 6.4 10*3/uL (ref 1.7–7.7)
Neutrophils Relative %: 78 %
Platelets: 248 10*3/uL (ref 150–400)
RBC: 3.98 MIL/uL — ABNORMAL LOW (ref 4.22–5.81)
RDW: 14.7 % (ref 11.5–15.5)
WBC: 8.2 10*3/uL (ref 4.0–10.5)
nRBC: 0 % (ref 0.0–0.2)

## 2020-07-05 LAB — I-STAT CHEM 8, ED
BUN: 20 mg/dL (ref 6–20)
Calcium, Ion: 1.05 mmol/L — ABNORMAL LOW (ref 1.15–1.40)
Chloride: 104 mmol/L (ref 98–111)
Creatinine, Ser: 0.9 mg/dL (ref 0.61–1.24)
Glucose, Bld: 163 mg/dL — ABNORMAL HIGH (ref 70–99)
HCT: 44 % (ref 39.0–52.0)
Hemoglobin: 15 g/dL (ref 13.0–17.0)
Potassium: 3.6 mmol/L (ref 3.5–5.1)
Sodium: 139 mmol/L (ref 135–145)
TCO2: 25 mmol/L (ref 22–32)

## 2020-07-05 LAB — LIPASE, BLOOD: Lipase: 21 U/L (ref 11–51)

## 2020-07-05 LAB — CBG MONITORING, ED: Glucose-Capillary: 144 mg/dL — ABNORMAL HIGH (ref 70–99)

## 2020-07-05 LAB — RESP PANEL BY RT-PCR (FLU A&B, COVID) ARPGX2
Influenza A by PCR: NEGATIVE
Influenza B by PCR: NEGATIVE
SARS Coronavirus 2 by RT PCR: NEGATIVE

## 2020-07-05 LAB — MAGNESIUM: Magnesium: 1.9 mg/dL (ref 1.7–2.4)

## 2020-07-05 LAB — ETHANOL: Alcohol, Ethyl (B): <10 mg/dL (ref <10)

## 2020-07-05 LAB — LACTIC ACID, PLASMA: Lactic Acid, Venous: 2.2 mmol/L (ref 0.5–1.9)

## 2020-07-05 LAB — TROPONIN I (HIGH SENSITIVITY): Troponin I (High Sensitivity): 3 ng/L (ref <18)

## 2020-07-05 MED ORDER — ALBUTEROL SULFATE (2.5 MG/3ML) 0.083% IN NEBU: 2.5000 mg | INHALATION_SOLUTION | RESPIRATORY_TRACT | Status: DC | PRN

## 2020-07-05 MED ORDER — GLYCOPYRROLATE 0.2 MG/ML IJ SOLN: 0.2000 mg | INTRAMUSCULAR | Status: DC | PRN

## 2020-07-05 MED ORDER — MORPHINE 100MG IN NS 100ML (1MG/ML) PREMIX INFUSION
1.0000 mg/h | INTRAVENOUS | Status: DC
  Administered 2020-07-05: 1 mg/h via INTRAVENOUS
  Filled 2020-07-05: qty 100

## 2020-07-05 MED ORDER — DOCUSATE SODIUM 50 MG/5ML PO LIQD
100.0000 mg | Freq: Two times a day (BID) | ORAL | Status: DC | PRN
  Filled 2020-07-05: qty 10

## 2020-07-05 MED ORDER — ROCURONIUM BROMIDE 50 MG/5ML IV SOLN
INTRAVENOUS | Status: AC | PRN
  Administered 2020-07-05: 70 mg via INTRAVENOUS

## 2020-07-05 MED ORDER — IOHEXOL 350 MG/ML SOLN
75.0000 mL | Freq: Once | INTRAVENOUS | Status: AC | PRN
  Administered 2020-07-05: 75 mL via INTRAVENOUS

## 2020-07-05 MED ORDER — PANTOPRAZOLE SODIUM 40 MG PO TBEC: 40.0000 mg | DELAYED_RELEASE_TABLET | Freq: Every day | ORAL | Status: DC

## 2020-07-05 MED ORDER — DOCUSATE SODIUM 100 MG PO CAPS: 100.0000 mg | ORAL_CAPSULE | Freq: Two times a day (BID) | ORAL | Status: DC | PRN

## 2020-07-05 MED ORDER — POLYETHYLENE GLYCOL 3350 17 G PO PACK: 17.0000 g | PACK | Freq: Every day | ORAL | Status: DC | PRN

## 2020-07-05 MED ORDER — LORAZEPAM 2 MG/ML PO CONC: 1.0000 mg | ORAL | Status: DC | PRN

## 2020-07-05 MED ORDER — LORAZEPAM 2 MG/ML IJ SOLN
1.0000 mg | INTRAMUSCULAR | Status: DC | PRN
  Administered 2020-07-05: 1 mg via INTRAVENOUS

## 2020-07-05 MED ORDER — MORPHINE SULFATE (PF) 4 MG/ML IV SOLN
4.0000 mg | INTRAVENOUS | Status: DC | PRN
  Administered 2020-07-05 (×3): 4 mg via INTRAVENOUS

## 2020-07-05 MED ORDER — PROPOFOL 1000 MG/100ML IV EMUL
5.0000 ug/kg/min | INTRAVENOUS | Status: DC
  Administered 2020-07-05: 5 ug/kg/min via INTRAVENOUS
  Filled 2020-07-05: qty 100

## 2020-07-05 MED ORDER — CLEVIDIPINE BUTYRATE 0.5 MG/ML IV EMUL
0.0000 mg/h | INTRAVENOUS | Status: DC
  Filled 2020-07-05: qty 50

## 2020-07-05 MED ORDER — GLYCOPYRROLATE 1 MG PO TABS: 1.0000 mg | ORAL_TABLET | ORAL | Status: DC | PRN

## 2020-07-05 MED ORDER — LORAZEPAM 2 MG/ML IJ SOLN
1.0000 mg | INTRAMUSCULAR | Status: DC | PRN
  Filled 2020-07-05: qty 1

## 2020-07-05 MED ORDER — ETOMIDATE 2 MG/ML IV SOLN
INTRAVENOUS | Status: AC | PRN
  Administered 2020-07-05: 20 mg via INTRAVENOUS

## 2020-07-05 MED ORDER — PANTOPRAZOLE SODIUM 40 MG IV SOLR
40.0000 mg | INTRAVENOUS | Status: DC
  Administered 2020-07-05: 40 mg via INTRAVENOUS
  Filled 2020-07-05: qty 40

## 2020-07-05 MED ORDER — PIPERACILLIN-TAZOBACTAM 3.375 G IVPB 30 MIN
3.3750 g | Freq: Once | INTRAVENOUS | Status: DC
  Filled 2020-07-05: qty 50

## 2020-07-05 MED ORDER — PROPOFOL 1000 MG/100ML IV EMUL
INTRAVENOUS | Status: AC
  Filled 2020-07-05: qty 100

## 2020-07-05 MED ORDER — PANTOPRAZOLE SODIUM 40 MG PO PACK
40.0000 mg | PACK | Freq: Every day | ORAL | Status: DC
  Filled 2020-07-05: qty 20

## 2020-07-05 MED ORDER — LORAZEPAM 1 MG PO TABS: 1.0000 mg | ORAL_TABLET | ORAL | Status: DC | PRN

## 2020-07-05 MED ORDER — PROTAMINE SULFATE 10 MG/ML IV SOLN
35.0000 mg | Freq: Once | INTRAVENOUS | Status: AC
  Administered 2020-07-05: 35 mg via INTRAVENOUS
  Filled 2020-07-05: qty 3.5

## 2020-07-05 MED ORDER — IOHEXOL 350 MG/ML SOLN: 75.0000 mL | Freq: Once | INTRAVENOUS | Status: DC | PRN

## 2020-07-05 MED ORDER — INSULIN ASPART 100 UNIT/ML ~~LOC~~ SOLN: 0.0000 [IU] | SUBCUTANEOUS | Status: DC

## 2020-07-06 NOTE — Progress Notes (Addendum)
Pt was pronounced dead at 2343 by [place] Chinedu Agustin RN and Vincent Gros RN. Family at bedside.

## 2020-07-07 ENCOUNTER — Telehealth: Payer: Self-pay | Admitting: Family Medicine

## 2020-07-07 NOTE — Telephone Encounter (Signed)
Called, no answer. Will try later.  

## 2020-07-07 NOTE — Telephone Encounter (Signed)
Called to speak with sister Cala Bradford, no answer. Will try again later.

## 2020-07-09 NOTE — Telephone Encounter (Signed)
Called, no answer. Will try again later.

## 2020-07-10 LAB — CULTURE, BLOOD (ROUTINE X 2)
Culture: NO GROWTH
Special Requests: ADEQUATE

## 2020-07-14 NOTE — Telephone Encounter (Signed)
Called, no answer. Will try later.  

## 2020-07-14 NOTE — Telephone Encounter (Signed)
Spoke with sister Cala Bradford. Expressed my condolences. Answered questions.

## 2020-07-19 ENCOUNTER — Ambulatory Visit: Payer: Medicare Other | Admitting: Neurology

## 2020-07-26 ENCOUNTER — Other Ambulatory Visit: Payer: 59

## 2020-07-31 NOTE — ED Notes (Signed)
RN gave report

## 2020-07-31 NOTE — Progress Notes (Signed)
Met with sister with her good friend Shirlean Mylar present. Explained situation and what Herman would have wanted. We agreed that he would like to pass in peace without suffering. For now, continue vent so family can come say goodbye but no escalation of present care.  DNR.  Transition to full comfort when family ready.  Erskine Emery MD PCCM

## 2020-07-31 NOTE — Progress Notes (Signed)
eLink Physician-Brief Progress Note Patient Name: Kenneth [name] DOB: [birth year] MRN: [id number]   Date of Service  07/08/2020  HPI/Events of Note  Family requesting for terminal extubation to be done tonight. Spoke with sister Melina Modena), brother, nephew and niece on camera and they confirmed that they would would want Mr Mcgroarty extubated tonight and to provide comfort measures, that everyone that needs to visit is present at bedside  eICU Interventions  Orders placed for comfort measures     Intervention Category Minor Interventions: Communication with other healthcare providers and/or family  Darl Pikes 07/08/2020, 9:41 PM

## 2020-07-31 NOTE — ED Notes (Signed)
Chaplain at bedside. Dr. Katrinka Blazing spoke with family. Pt is now on comfort care.

## 2020-07-31 NOTE — Death Summary Note (Signed)
DEATH SUMMARY   Patient Details  Name: Lucy Woolever Corum 25, [birth year]24 MRN: [id number] DOB: February 09, 1971  Admission/Discharge Information   Admit Date:  19-Jul-2020  Date of Death: Date of Death: 2020/07/19  Time of Death: Time of Death: 11/22/2341  Length of Stay: 1  Referring Physician: Eustaquio Boyden, MD   Reason(s) for Hospitalization  Intracranial hemorrhage  Diagnoses  Preliminary cause of death:  Secondary Diagnoses (including complications and co-morbidities):  Active Problems:   Respiratory arrest [facility])   Brief Hospital Course (including significant findings, care, treatment, and services provided and events leading to death)  Tiyon Sanor Glazier 25, 2024 is a 50 y.o. year old male who presented to emergency department in a comatose state emergently intubated.  CT of the head revealed a spontaneous non-survivable intracranial hemorrhage. Family elected to take patient off ventilator and allow him to pass in peace.    Pertinent Labs and Studies  Significant Diagnostic Studies CT Head Wo Contrast  Result Date: 07/19/20 CLINICAL DATA:  Mental status change, unknown cause. Patient unresponsive. EXAM: CT HEAD WITHOUT CONTRAST TECHNIQUE: Contiguous axial images were obtained from the base of the skull through the vertex without intravenous contrast. COMPARISON:  Prior head CT examinations 07/27/2017 and earlier. FINDINGS: Brain: Very large acute parenchymal hemorrhage centered within the right frontal lobe measuring 6.2 x 5.4 x 3.7 cm. Large volume intraventricular extension of hemorrhage greatest within the right lateral, third and fourth ventricles. Moderate hydrocephalus with lateral and third ventriculomegaly. Subtle periventricular hypodensity [place] reflect transependymal flow of CSF. Associated mass effect with 1.6 cm leftward midline shift and leftward subfalcine herniation. Additional subcentimeter acute parenchymal hemorrhage within the left temporal lobe (series 8, image 32). Scattered trace subarachnoid  hemorrhage greatest along the anterior right frontal lobe. Vascular: No hyperdense vessel. Skull: Normal. Negative for fracture or focal lesion. Sinuses/Orbits: Visualized orbits show no acute finding. Near complete opacification of the right maxillary sinus. Small left maxillary sinus mucous retention cysts. Moderate ethmoid sinus mucosal thickening. Other: Nonspecific subcentimeter right parietal scalp lesion (series 6, image 20) These results were called by telephone at the time of interpretation on July 19, 2020 at 6:12 pm to provider [facility] , who verbally acknowledged these results. IMPRESSION: Very large acute parenchymal hemorrhage centered within the right frontal lobe measuring 6.2 x 5.4 x 3.7 cm. Large volume intraventricular extension of hemorrhage greatest within the right lateral, third and fourth ventricles. Moderate hydrocephalus with lateral and third ventriculomegaly and possible transependymal flow of CSF. Mass effect with 1.5 cm leftward midline shift and leftward subfalcine herniation. Additional subcentimeter acute parenchymal hemorrhage within the left temporal lobe. Scattered trace subarachnoid hemorrhage, greatest overlying the anterior right frontal lobe. Electronically Signed   By: Jackey Loge DO   On: 19-Jul-2020 18:20   CT Angio Chest PE W and/or Wo Contrast  Result Date: July 19, 2020 CLINICAL DATA:  Respiratory failure. EXAM: CT ANGIOGRAPHY CHEST WITH CONTRAST TECHNIQUE: Multidetector CT imaging of the chest was performed using the standard protocol during bolus administration of intravenous contrast. Multiplanar CT image reconstructions and MIPs were obtained to evaluate the vascular anatomy. CONTRAST:  38mL OMNIPAQUE IOHEXOL 350 MG/ML SOLN COMPARISON:  May 10, 2020. FINDINGS: Cardiovascular: Satisfactory opacification of the pulmonary arteries to the segmental level. No evidence of pulmonary embolism. Normal heart size. No pericardial effusion. Mediastinum/Nodes: No  adenopathy is noted. Thyroid gland is unremarkable. Endotracheal tube is in grossly good position. Nasogastric tube is seen passing through esophagus and into stomach. Lungs/Pleura: No pneumothorax or pleural effusion is noted. Bilateral lower lobe airspace opacities  are noted most consistent with pneumonia. Upper Abdomen: No acute abnormality. Musculoskeletal: No chest wall abnormality. No acute or significant osseous findings. Review of the MIP images confirms the above findings. IMPRESSION: 1. No definite evidence of pulmonary embolus. 2. Bilateral lower lobe airspace opacities are noted most consistent with pneumonia. 3. Endotracheal and nasogastric tubes are in grossly good position. Electronically Signed   By: Lupita Raider M.D.   On: 2020-07-08 18:25   DG Chest Portable 1 View  Result Date: 07-08-2020 CLINICAL DATA:  Unresponsive.  Status post intubation. EXAM: PORTABLE CHEST 1 VIEW COMPARISON:  May 31, 2020 FINDINGS: An endotracheal tube is seen with its distal tip approximately 3.2 cm from the carina. Mild left infrahilar atelectasis and/or infiltrate is seen. A 1.4 cm x 0.9 cm well-defined focal opacity is seen overlying the right lung base. This is not seen on the prior exam. The cardiac silhouette is moderately enlarged. The visualized skeletal structures are unremarkable. IMPRESSION: 1. Mild left infrahilar atelectasis and/or infiltrate. 2. Focal opacity overlying the right lung base which [place] represent a nipple shadow. Repeat chest radiograph with nipple markers is recommended. Electronically Signed   By: Aram Candela M.D.   On: Jul 08, 2020 17:48    Microbiology Recent Results (from the past 240 hour(s))  Blood culture (routine x 2)     Status: None (Preliminary result)   Collection Time: 07-08-2020  5:10 PM   Specimen: Site Not Specified; Blood  Result Value Ref Range Status   Specimen Description SITE NOT SPECIFIED  Final   Special Requests   Final    BOTTLES DRAWN AEROBIC AND  ANAEROBIC Blood Culture adequate volume   Culture   Final    NO GROWTH 2 DAYS Performed at [facility], [address], [place], [place] [address]    Report Status PENDING  Incomplete  Resp Panel by RT-PCR (Flu A&B, Covid) Nasopharyngeal Swab     Status: None   Collection Time: 07/08/20  5:14 PM   Specimen: Nasopharyngeal Swab; Nasopharyngeal(NP) swabs in vial transport medium  Result Value Ref Range Status   SARS Coronavirus 2 by RT PCR NEGATIVE NEGATIVE Final    Comment: (NOTE) SARS-CoV-2 target nucleic acids are NOT DETECTED.  The SARS-CoV-2 RNA is generally detectable in upper respiratory specimens during the acute phase of infection. The lowest concentration of SARS-CoV-2 viral copies this assay can detect is 138 copies/mL. A negative result does not preclude SARS-Cov-2 infection and should not be used as the sole basis for treatment or other patient management decisions. A negative result [place] occur with  improper specimen collection/handling, submission of specimen other than nasopharyngeal swab, presence of viral mutation(s) within the areas targeted by this assay, and inadequate number of viral copies(<138 copies/mL). A negative result must be combined with clinical observations, patient history, and epidemiological information. The expected result is Negative.  Fact Sheet for Patients:  BloggerCourse.com  Fact Sheet for Healthcare Providers:  SeriousBroker.it  This test is no t yet approved or cleared by the [place] FDA and  has been authorized for detection and/or diagnosis of SARS-CoV-2 by FDA under an Emergency Use Authorization (EUA). This EUA will remain  in effect (meaning this test can be used) for the duration of the COVID-19 declaration under Section 564(b)(1) of the Act, 21 U.S.C.section 360bbb-3(b)(1), unless the authorization is terminated  or revoked sooner.       Influenza A by PCR NEGATIVE  NEGATIVE Final   Influenza B by PCR NEGATIVE NEGATIVE  Final    Comment: (NOTE) The Xpert Xpress SARS-CoV-2/FLU/RSV plus assay is intended as an aid in the diagnosis of influenza from Nasopharyngeal swab specimens and should not be used as a sole basis for treatment. Nasal washings and aspirates are unacceptable for Xpert Xpress SARS-CoV-2/FLU/RSV testing.  Fact Sheet for Patients: BloggerCourse.com  Fact Sheet for Healthcare Providers: SeriousBroker.it  This test is not yet approved or cleared by the [place] FDA and has been authorized for detection and/or diagnosis of SARS-CoV-2 by FDA under an Emergency Use Authorization (EUA). This EUA will remain in effect (meaning this test can be used) for the duration of the COVID-19 declaration under Section 564(b)(1) of the Act, 21 U.S.C. section 360bbb-3(b)(1), unless the authorization is terminated or revoked.  Performed at [facility], [address] [place], [place] [address]     Lab Basic Metabolic Panel: Recent Labs  Lab Jul 16, 2020 1714 Jul 16, 2020 1715  NA 139 138  139  K 3.7 3.6  3.6  CL 103 104  CO2 24  --   GLUCOSE 166* 163*  BUN 17 20  CREATININE 0.97 0.90  CALCIUM 8.5*  --   MG 1.9  --    Liver Function Tests: Recent Labs  Lab July 16, 2020 1714  AST 22  ALT 23  ALKPHOS 100  BILITOT 0.6  PROT 6.8  ALBUMIN 3.7   Recent Labs  Lab 07-16-20 1714  LIPASE 21   No results for input(s): AMMONIA in the last 168 hours. CBC: Recent Labs  Lab 2020-07-16 1714 2020/07/16 1715  WBC 8.2  --   NEUTROABS 6.4  --   HGB 14.2 14.3  15.0  HCT 42.1 42.0  44.0  MCV 105.8*  --   PLT 248  --    Cardiac Enzymes: No results for input(s): CKTOTAL, CKMB, CKMBINDEX, TROPONINI in the last 168 hours. Sepsis Labs: Recent Labs  Lab 07-16-20 1714 Jul 16, 2020 1750  WBC 8.2  --   LATICACIDVEN  --  2.2Lorin Glass 07/07/2020, 5:18 PM

## 2020-07-31 NOTE — Procedures (Signed)
Extubation Procedure Note  Patient Details:   Name: Kenneth [name] DOB: Jun 23, 1971 MRN: [id number]   Airway Documentation:  Airway 7.5 mm (Active)  Secured at (cm) 21 cm 14-Jul-2020 1950  Measured From Lips 2020/07/14 2000  Secured Location Right 07-14-20 1950  Secured By [employer] 07/14/2020 2000  Tube Holder Repositioned Yes 2020-07-14 1950  Prone position No Jul 14, 2020 1950  Cuff Pressure (cm H2O) 26 cm H2O 07/14/20 1950  Site Condition Dry 07/14/20 1702   Vent end date: (not recorded) Vent end time: (not recorded)   Evaluation  O2 sats: currently acceptable Complications: No apparent complications Patient did tolerate procedure well. Bilateral Breath Sounds: (P) Clear, Diminished   No  Extubated PT to terminal wean.  [name] 07-14-20, 11:21 PM

## 2020-07-31 NOTE — Consult Note (Signed)
Reason for Consult: Intraventricular hemorrhage Referring Physician: ER  Kenneth [name] is an 50 y.o. male.  HPI: 50 year old gentleman history of Down syndrome also on blood thinners for PEs following a Covid infection.  Patient is complaining of a headache earlier and then became unresponsive patient was brought to ER intubated CT scan showed large intraventricular hemorrhage and we have been consulted.  No past medical history on file.    No family history on file.  Social History:  has no history on file for tobacco use, alcohol use, and drug use.  Allergies: Not on File  Medications: I have reviewed the patient's current medications.  Results for orders placed or performed during the hospital encounter of 07/26/2020 (from the past 48 hour(s))  CBC with Differential     Status: Abnormal   Collection Time: 07/16/2020  5:14 PM  Result Value Ref Range   WBC 8.2 4.0 - 10.5 K/uL   RBC 3.98 (L) 4.22 - 5.81 MIL/uL   Hemoglobin 14.2 13.0 - 17.0 g/dL   HCT 16.1 39 - 52 %   MCV 105.8 (H) 80.0 - 100.0 fL   MCH 35.7 (H) 26.0 - 34.0 pg   MCHC 33.7 30.0 - 36.0 g/dL   RDW 09.6 04.5 - 40.9 %   Platelets 248 150 - 400 K/uL   nRBC 0.0 0.0 - 0.2 %   Neutrophils Relative % 78 %   Neutro Abs 6.4 1.7 - 7.7 K/uL   Lymphocytes Relative 14 %   Lymphs Abs 1.2 0.7 - 4.0 K/uL   Monocytes Relative 6 %   Monocytes Absolute 0.5 0.1 - 1.0 K/uL   Eosinophils Relative 0 %   Eosinophils Absolute 0.0 0.0 - 0.5 K/uL   Basophils Relative 1 %   Basophils Absolute 0.1 0.0 - 0.1 K/uL   Immature Granulocytes 1 %   Abs Immature Granulocytes 0.04 0.00 - 0.07 K/uL    Comment: Performed at [facility], [address], [place], [place] [address]  Comprehensive metabolic panel     Status: Abnormal   Collection Time: 07/02/2020  5:14 PM  Result Value Ref Range   Sodium 139 135 - 145 mmol/L   Potassium 3.7 3.5 - 5.1 mmol/L   Chloride 103 98 - 111 mmol/L   CO2 24 22 - 32 mmol/L   Glucose, Bld 166 (H) 70 - 99  mg/dL    Comment: Glucose reference range applies only to samples taken after fasting for at least 8 hours.   BUN 17 6 - 20 mg/dL   Creatinine, Ser 4.78 0.61 - 1.24 mg/dL   Calcium 8.5 (L) 8.9 - 10.3 mg/dL   Total Protein 6.8 6.5 - 8.1 g/dL   Albumin 3.7 3.5 - 5.0 g/dL   AST 22 15 - 41 U/L   ALT 23 0 - 44 U/L   Alkaline Phosphatase 100 38 - 126 U/L   Total Bilirubin 0.6 0.3 - 1.2 mg/dL   GFR, Estimated >29 >56 mL/min    Comment: (NOTE) Calculated using the CKD-EPI Creatinine Equation (2021)    Anion gap 12 5 - 15    Comment: Performed at [facility], [address] [place], [place] [address]  Lipase, blood     Status: None   Collection Time: 07/17/2020  5:14 PM  Result Value Ref Range   Lipase 21 11 - 51 U/L    Comment: Performed at [facility], [address] [place], [place] [address]  Troponin I (High  Sensitivity)     Status: None   Collection Time: 07/07/2020  5:14 PM  Result Value Ref Range   Troponin I (High Sensitivity) 3 <18 ng/L    Comment: (NOTE) Elevated high sensitivity troponin I (hsTnI) values and significant  changes across serial measurements Sorg suggest ACS but many other  chronic and acute conditions are known to elevate hsTnI results.  Refer to the "Links" section for chest pain algorithms and additional  guidance. Performed at [facility], [address], [place], [place] [address]   Magnesium     Status: None   Collection Time: 06/30/2020  5:14 PM  Result Value Ref Range   Magnesium 1.9 1.7 - 2.4 mg/dL    Comment: Performed at [facility], [address], [place], [place] [address]  I-stat chem 8, ED (not at [facility] or [facility])     Status: Abnormal   Collection Time: 07/01/2020  5:15 PM  Result Value Ref Range   Sodium 139 135 - 145 mmol/L   Potassium 3.6 3.5 - 5.1 mmol/L   Chloride 104 98 - 111 mmol/L   BUN 20 6 - 20 mg/dL   Creatinine, Ser 2.67 0.61 - 1.24 mg/dL   Glucose, Bld 124 (H) 70 - 99 mg/dL    Comment: Glucose reference  range applies only to samples taken after fasting for at least 8 hours.   Calcium, Ion 1.05 (L) 1.15 - 1.40 mmol/L   TCO2 25 22 - 32 mmol/L   Hemoglobin 15.0 13.0 - 17.0 g/dL   HCT 58.0 39 - 52 %  I-Stat venous blood gas, ED     Status: Abnormal   Collection Time: 07/28/2020  5:15 PM  Result Value Ref Range   pH, Ven 7.327 7.25 - 7.43   pCO2, Ven 51.5 44 - 60 mmHg   pO2, Ven 59.0 (H) 32 - 45 mmHg   Bicarbonate 26.9 20.0 - 28.0 mmol/L   TCO2 28 22 - 32 mmol/L   O2 Saturation 88.0 %   Acid-Base Excess 0.0 0.0 - 2.0 mmol/L   Sodium 138 135 - 145 mmol/L   Potassium 3.6 3.5 - 5.1 mmol/L   Calcium, Ion 1.08 (L) 1.15 - 1.40 mmol/L   HCT 42.0 39 - 52 %   Hemoglobin 14.3 13.0 - 17.0 g/dL   Sample type VENOUS   Ethanol     Status: None   Collection Time: 07/22/2020  5:16 PM  Result Value Ref Range   Alcohol, Ethyl (B) <10 <10 mg/dL    Comment: (NOTE) Lowest detectable limit for serum alcohol is 10 mg/dL.  For medical purposes only. Performed at [facility], [address], [place], [place] [address]   CBG monitoring, ED     Status: Abnormal   Collection Time: 07/09/2020  5:22 PM  Result Value Ref Range   Glucose-Capillary 144 (H) 70 - 99 mg/dL    Comment: Glucose reference range applies only to samples taken after fasting for at least 8 hours.  Lactic acid, plasma     Status: Abnormal   Collection Time: 07/29/2020  5:50 PM  Result Value Ref Range   Lactic Acid, Venous 2.2 (HH) 0.5 - 1.9 mmol/L    Comment: CRITICAL RESULT CALLED TO, READ BACK BY AND VERIFIED WITH: V.LAWSON,RN @1822  07/22/2020 [email or web address] Performed at [facility], [address] [place], [place]     CT Head Wo Contrast  Result Date: 07/10/2020 CLINICAL DATA:  Mental status change,  unknown cause. Patient unresponsive. EXAM: CT HEAD WITHOUT CONTRAST TECHNIQUE: Contiguous axial images were obtained from the base of the skull through the vertex without intravenous contrast. COMPARISON:  Prior head CT  examinations 07/27/2017 and earlier. FINDINGS: Brain: Very large acute parenchymal hemorrhage centered within the right frontal lobe measuring 6.2 x 5.4 x 3.7 cm. Large volume intraventricular extension of hemorrhage greatest within the right lateral, third and fourth ventricles. Moderate hydrocephalus with lateral and third ventriculomegaly. Subtle periventricular hypodensity Weinreb reflect transependymal flow of CSF. Associated mass effect with 1.6 cm leftward midline shift and leftward subfalcine herniation. Additional subcentimeter acute parenchymal hemorrhage within the left temporal lobe (series 8, image 32). Scattered trace subarachnoid hemorrhage greatest along the anterior right frontal lobe. Vascular: No hyperdense vessel. Skull: Normal. Negative for fracture or focal lesion. Sinuses/Orbits: Visualized orbits show no acute finding. Near complete opacification of the right maxillary sinus. Small left maxillary sinus mucous retention cysts. Moderate ethmoid sinus mucosal thickening. Other: Nonspecific subcentimeter right parietal scalp lesion (series 6, image 20) These results were called by telephone at the time of interpretation on 06/30/2020 at 6:12 pm to provider [facility] , who verbally acknowledged these results. IMPRESSION: Very large acute parenchymal hemorrhage centered within the right frontal lobe measuring 6.2 x 5.4 x 3.7 cm. Large volume intraventricular extension of hemorrhage greatest within the right lateral, third and fourth ventricles. Moderate hydrocephalus with lateral and third ventriculomegaly and possible transependymal flow of CSF. Mass effect with 1.5 cm leftward midline shift and leftward subfalcine herniation. Additional subcentimeter acute parenchymal hemorrhage within the left temporal lobe. Scattered trace subarachnoid hemorrhage, greatest overlying the anterior right frontal lobe. Electronically Signed   By: Jackey Loge DO   On: 07/14/2020 18:20   CT Angio Chest PE W  and/or Wo Contrast  Result Date: 07/04/2020 CLINICAL DATA:  Respiratory failure. EXAM: CT ANGIOGRAPHY CHEST WITH CONTRAST TECHNIQUE: Multidetector CT imaging of the chest was performed using the standard protocol during bolus administration of intravenous contrast. Multiplanar CT image reconstructions and MIPs were obtained to evaluate the vascular anatomy. CONTRAST:  54mL OMNIPAQUE IOHEXOL 350 MG/ML SOLN COMPARISON:  May 10, 2020. FINDINGS: Cardiovascular: Satisfactory opacification of the pulmonary arteries to the segmental level. No evidence of pulmonary embolism. Normal heart size. No pericardial effusion. Mediastinum/Nodes: No adenopathy is noted. Thyroid gland is unremarkable. Endotracheal tube is in grossly good position. Nasogastric tube is seen passing through esophagus and into stomach. Lungs/Pleura: No pneumothorax or pleural effusion is noted. Bilateral lower lobe airspace opacities are noted most consistent with pneumonia. Upper Abdomen: No acute abnormality. Musculoskeletal: No chest wall abnormality. No acute or significant osseous findings. Review of the MIP images confirms the above findings. IMPRESSION: 1. No definite evidence of pulmonary embolus. 2. Bilateral lower lobe airspace opacities are noted most consistent with pneumonia. 3. Endotracheal and nasogastric tubes are in grossly good position. Electronically Signed   By: Lupita Raider M.D.   On: 07/11/2020 18:25   DG Chest Portable 1 View  Result Date: 07/23/2020 CLINICAL DATA:  Unresponsive.  Status post intubation. EXAM: PORTABLE CHEST 1 VIEW COMPARISON:  May 31, 2020 FINDINGS: An endotracheal tube is seen with its distal tip approximately 3.2 cm from the carina. Mild left infrahilar atelectasis and/or infiltrate is seen. A 1.4 cm x 0.9 cm well-defined focal opacity is seen overlying the right lung base. This is not seen on the prior exam. The cardiac silhouette is moderately enlarged. The visualized skeletal structures are  unremarkable. IMPRESSION: 1. Mild left infrahilar  atelectasis and/or infiltrate. 2. Focal opacity overlying the right lung base which [place]ne represent a nipple shadow. Repeat chest radiograph with nipple markers is recommended. Electronically Signed   By: Aram Candelahaddeus  Houston M.D.   On: 07/27/2020 17:48    Review of Systems  Unable to perform ROS: Intubated   Height 5\' 4"  (1.626 m), weight 73 kg. Physical Exam Neurological:     Comments: Comatose pupils 6 mm nonreactive weak corneal on the right no corneal on the left no movement to noxious stimulation currently on no paralytics just low-dose propofol     Assessment/Plan: 50 year old with devastating intraventricular intracerebral hemorrhage.  Patient's clinical exam minimal only thing I found was a week corneal on the right.  In light of this devastating infection on blood thinners there is no hope of functional survival.  I do not recommend any procedures or interventions.  I recommend supportive care.  I extensively gone over this and explained it with the patient's mother and she understands.  Kenneth [name] 07/25/2020, 6:55 PM

## 2020-07-31 NOTE — H&P (Addendum)
07/21/2020  I have seen and evaluated the patient for headache followed by coma.  S:  50 year old man with hx of Down's Syndrome and recent COVID-associated DVT on lovenox presenting after being found unresponsive by family.  He was complaining of headache earlier in day and laid down for nap.  EMS was called, patient was bagged en route to hospital, comatose the entire time.  O: Height 5\' 4"  (1.626 m), weight 73 kg.  Comatose man on vent, triggers vent, pupils fixed/dilated, no corneals, GCS3 but did receive RSI so everything except pupillary reflex Weider be related to that.  Per ER staff this exam is similar to pre-RSI however.  CT head with large intraparenchymal hemorrhage with intraventricular and basal cistern extension  A:  Coma secondary to large intraparenchymal hemorrhage Respiratory failure due to inability to protect airway and aspiration  P:  -Vent support, VAP prevention bundle -Cleviprex goal SBP <140 -Zosyn for aspiration pneumonitis should be fine -Suspect this is a terminal bleed, EDP to reach out to NSGY to discuss utility of lovenox reversal and surgical eligibility -Sister updated regarding grim prognosis, understandably upset, more family en route  34 minutes critical care time 07/22/2020  14/12/2019 MD     NAME:  Kenneth [name], MRN:  Kenneth [name], DOB:  Jul 29, 1971, LOS: 0 ADMISSION DATE:  07/16/2020, CONSULTATION DATE:  07/24/2020 REFERRING MD:  Tegeler, CHIEF COMPLAINT:  Respiratory distress   Brief History   Kenneth [name] is a 50 yo M w/ PMH of Down syndrome presenting to [facility] after being found down in respiratory arrest.  History of present illness   Kenneth [name] is a 50 yo M w/ PMH of Down syndrome, Seizure disorder, HLD, congenital heart defect, recent COVID infection on 10/10 with PE on therapeutic lovenox who was found down at home and brought by EMS. Last known normal 1 hour prior. EMS on arrival found patient to be in hypoxic respiratory failure, requiring BVM ventilation  and brought to [facility] for assessment.  Kenneth [name] is intubated and unable to provide history. Family was contacted for additional history. She mentions that he was in his usual state of health until earlier today when he developed a headache. Initially thought he was having reaction to a bad chocolate and laid down. Subsequently he was found to have difficulty breathing with choking and gurgling and EMS was called.  Past Medical History  Down Syndrome, Seizure Disorder, HLD, Congential heart defect, recent COVID infeciton 10/10. PE  Significant Hospital Events   07/12/2020 Admission  Consults:  N/A  Procedures:  07/01/2020 Intubation  Significant Diagnostic Tests:  07/12/2020 Chest X-ray: personally reviewed, wide mediastinum with left perihilar opacity 07/11/2020 Head CT: personally reviewed, Large right sided intracranial hemorrhage with midline shift  Micro Data:  07/14/2020 blood culture  Antimicrobials:  07/04/2020  Zosyn  Interim history/subjective:  Examined and evaluated at bedside. Noted to be minimally responsive.   Objective   Height 5\' 4"  (1.626 m), weight 73 kg.    Vent Mode: PRVC FiO2 (%):  [40 %] 40 % Set Rate:  [18 bmp] 18 bmp Vt Set:  [470 mL] 470 mL PEEP:  [5 cmH20] 5 cmH20 Plateau Pressure:  [15 cmH20] 15 cmH20  No intake or output data in the 24 hours ending 07/10/2020 1731 Filed Weights   07/19/2020 1702  Weight: 73 kg   Examination:  Gen: Well-developed, chronically ill-appearing, sedated HEENT: NCAT head, hearing intact, intubated, fixed pupils Neck: supple, ROM intact, no JVD, no cervical adenopathy CV: RRR, S1, S2  normal, Pulm: CTAB, No rales, no wheezes Abd: Soft, BS+, NTND, No rebound, no guarding Extm: ROM intact, Peripheral pulses intact, No peripheral edema Skin: Dry, Warm, normal turgor.  Neuro: GCS 3  Assessment & Plan:  #Massive intracranial hemorrhage #Respiratory arrest due to above Present with respiratory arrest. CT head with massive hemorrhage with  midline shift. Has been on therapeutic dose Lovenox for recent diagnosis of PE which was diagnosed during recent admission for COVID. Poor prognosis for neurological recovery. - Need goals of care discussion with such poor prognosis - C/w mechanical ventilation, VAP protocol - PPI while intubated - Aspiration pneumonai coverage w/ Zosyn - Hold home anti-coagulation - Keep systolic bp <180 - Neurochecks - Seizure precautions  #Seizure Disorder Has hx of seizure disorder. On phenobarbitol for seizure disorder. Not the cause for current event. - Seizure precautions  Best practice (evaluated daily)    Diet: NPO Pain/Anxiety/Delirium protocol (if indicated): propofol VAP protocol (if indicated): Zosyn DVT prophylaxis: SCDs GI prophylaxis: PPI Glucose control: SSI Mobility: N/A last date of multidisciplinary goals of care discussion August 01, 2020 Family and staff present: Y Summary of discussion: Understands poor prognosis. Plan for family meeting  Follow up goals of care discussion due 07/30/2020 Code Status: Full Disposition: ICU  Labs   CBC: Recent Labs  Lab 2020-08-01 1715  HGB 14.3  15.0  HCT 42.0  44.0    Basic Metabolic Panel: Recent Labs  Lab 08/01/20 1715  NA 138  139  K 3.6  3.6  CL 104  GLUCOSE 163*  BUN 20  CREATININE 0.90   GFR: Estimated Creatinine Clearance: 90.9 mL/min (by C-G formula based on SCr of 0.9 mg/dL). No results for input(s): PROCALCITON, WBC, LATICACIDVEN in the last 168 hours.  Liver Function Tests: No results for input(s): AST, ALT, ALKPHOS, BILITOT, PROT, ALBUMIN in the last 168 hours. No results for input(s): LIPASE, AMYLASE in the last 168 hours. No results for input(s): AMMONIA in the last 168 hours.  ABG    Component Value Date/Time   HCO3 26.9 08-01-2020 1715   TCO2 25 08/01/2020 1715   TCO2 28 08-01-20 1715   O2SAT 88.0 08-01-2020 1715     Coagulation Profile: No results for input(s): INR, PROTIME in the last 168  hours.  Cardiac Enzymes: No results for input(s): CKTOTAL, CKMB, CKMBINDEX, TROPONINI in the last 168 hours.  HbA1C: No results found for: HGBA1C  CBG: Recent Labs  Lab 2020/08/01 1722  GLUCAP 144*    Review of Systems:   Unable to assess  Past Medical History  He,  has no past medical history on file.   Surgical History   N/A   Social History      Family History   His family history is not on file.   Allergies Not on File   Home Medications  Prior to Admission medications   Not on File      Critical care time:     Theotis Barrio, MD 01-Aug-2020, 6:07 PM [facility] Internal Medicine PGY-3 Pager: [phone]

## 2020-07-31 NOTE — Progress Notes (Signed)
Patient transported to 3M06 with incidence. Report given to receiving RT.

## 2020-07-31 NOTE — ED Notes (Signed)
Critical Care paged to [phone]-per Dr. Rush Landmark paged by Marylene Land

## 2020-07-31 NOTE — ED Notes (Signed)
RN attempted to call report 

## 2020-07-31 NOTE — ED Provider Notes (Signed)
[facility]   CSN: [id number] Arrival date & time: 07/08/2020  1711     History No chief complaint on file.   [place]ona [place] Ww Doe is a 50 y.o. male.  The history is provided by the patient and medical records. No language interpreter was used.  Cerebrovascular Accident This is a new problem. The current episode started 1 to 2 hours ago. The problem occurs constantly. The problem has not changed since onset.Associated symptoms include headaches. Pertinent negatives include no chest pain, no abdominal pain and no shortness of breath.   LVL 5 caveat for AMS and unresponsiveness     No past medical history on file.  There are no problems to display for this patient.   No family history on file.  Social History   Tobacco Use  . Smoking status: Not on file  Substance Use Topics  . Alcohol use: Not on file  . Drug use: Not on file    Home Medications Prior to Admission medications   Not on File    Allergies    Patient has no allergy information on record.  Review of Systems   Review of Systems  Unable to perform ROS: Mental status change  Respiratory: Negative for shortness of breath.   Cardiovascular: Negative for chest pain.  Gastrointestinal: Negative for abdominal pain.  Neurological: Positive for headaches.    Physical Exam Updated Vital Signs There were no vitals taken for this visit.  Physical Exam Vitals and nursing note reviewed.  Constitutional:      General: He is in acute distress.     Appearance: He is well-developed. He is ill-appearing. He is not diaphoretic.  HENT:     Head: Normocephalic and atraumatic.     Nose: No congestion or rhinorrhea.     Mouth/Throat:     Mouth: Mucous membranes are moist.     Pharynx: No oropharyngeal exudate or posterior oropharyngeal erythema.  Eyes:     Conjunctiva/sclera: Conjunctivae normal.     Comments: Pupils are 4 mm and minimally reactive  bilaterally.  Cardiovascular:     Rate and Rhythm: Normal rate and regular rhythm.     Pulses: Normal pulses.     Heart sounds: No murmur heard.   Pulmonary:     Effort: Respiratory distress present.     Breath sounds: Rhonchi present. No wheezing or rales.  Chest:     Chest wall: No tenderness.  Abdominal:     Palpations: Abdomen is soft.     Tenderness: There is no abdominal tenderness.  Musculoskeletal:        General: No tenderness.     Cervical back: Neck supple.  Skin:    General: Skin is warm and dry.     Capillary Refill: Capillary refill takes less than 2 seconds.     Findings: No erythema.  Neurological:     Mental Status: He is unresponsive.     GCS: GCS eye subscore is 1. GCS verbal subscore is 1. GCS motor subscore is 1.     ED Results / Procedures / Treatments   Labs (all labs ordered are listed, but only abnormal results are displayed) Labs Reviewed  CBC WITH DIFFERENTIAL/PLATELET - Abnormal; Notable for the following components:      Result Value   RBC 3.98 (*)    MCV 105.8 (*)    MCH 35.7 (*)    All other components within normal limits  COMPREHENSIVE METABOLIC PANEL - Abnormal; Notable  for the following components:   Glucose, Bld 166 (*)    Calcium 8.5 (*)    All other components within normal limits  LACTIC ACID, PLASMA - Abnormal; Notable for the following components:   Lactic Acid, Venous 2.2 (*)    All other components within normal limits  I-STAT CHEM 8, ED - Abnormal; Notable for the following components:   Glucose, Bld 163 (*)    Calcium, Ion 1.05 (*)    All other components within normal limits  I-STAT VENOUS BLOOD GAS, ED - Abnormal; Notable for the following components:   pO2, Ven 59.0 (*)    Calcium, Ion 1.08 (*)    All other components within normal limits  CBG MONITORING, ED - Abnormal; Notable for the following components:   Glucose-Capillary 144 (*)    All other components within normal limits  RESP PANEL BY RT-PCR (FLU A&B,  COVID) ARPGX2  CULTURE, BLOOD (ROUTINE X 2)  LIPASE, BLOOD  MAGNESIUM  ETHANOL  TROPONIN I (HIGH SENSITIVITY)    EKG EKG Interpretation  Date/Time:  Monday 2020-07-14 17:19:13 EST Ventricular Rate:  72 PR Interval:    QRS Duration: 100 QT Interval:  430 QTC Calculation: 471 R Axis:   34 Text Interpretation: Sinus rhythm Abnormal R-wave progression, early transition No prio rECG for comparison. No STEMI Confirmed by Theda Belfast ([id number]) on July 14, 2020 5:21:36 PM   Radiology CT Head Wo Contrast  Result Date: 07/14/20 CLINICAL DATA:  Mental status change, unknown cause. Patient unresponsive. EXAM: CT HEAD WITHOUT CONTRAST TECHNIQUE: Contiguous axial images were obtained from the base of the skull through the vertex without intravenous contrast. COMPARISON:  Prior head CT examinations 07/27/2017 and earlier. FINDINGS: Brain: Very large acute parenchymal hemorrhage centered within the right frontal lobe measuring 6.2 x 5.4 x 3.7 cm. Large volume intraventricular extension of hemorrhage greatest within the right lateral, third and fourth ventricles. Moderate hydrocephalus with lateral and third ventriculomegaly. Subtle periventricular hypodensity Ord reflect transependymal flow of CSF. Associated mass effect with 1.6 cm leftward midline shift and leftward subfalcine herniation. Additional subcentimeter acute parenchymal hemorrhage within the left temporal lobe (series 8, image 32). Scattered trace subarachnoid hemorrhage greatest along the anterior right frontal lobe. Vascular: No hyperdense vessel. Skull: Normal. Negative for fracture or focal lesion. Sinuses/Orbits: Visualized orbits show no acute finding. Near complete opacification of the right maxillary sinus. Small left maxillary sinus mucous retention cysts. Moderate ethmoid sinus mucosal thickening. Other: Nonspecific subcentimeter right parietal scalp lesion (series 6, image 20) These results were called by telephone at the time of  interpretation on 07-14-20 at 6:12 pm to provider [facility] , who verbally acknowledged these results. IMPRESSION: Very large acute parenchymal hemorrhage centered within the right frontal lobe measuring 6.2 x 5.4 x 3.7 cm. Large volume intraventricular extension of hemorrhage greatest within the right lateral, third and fourth ventricles. Moderate hydrocephalus with lateral and third ventriculomegaly and possible transependymal flow of CSF. Mass effect with 1.5 cm leftward midline shift and leftward subfalcine herniation. Additional subcentimeter acute parenchymal hemorrhage within the left temporal lobe. Scattered trace subarachnoid hemorrhage, greatest overlying the anterior right frontal lobe. Electronically Signed   By: Jackey Loge DO   On: 14-Jul-2020 18:20   CT Angio Chest PE W and/or Wo Contrast  Result Date: 2020/07/14 CLINICAL DATA:  Respiratory failure. EXAM: CT ANGIOGRAPHY CHEST WITH CONTRAST TECHNIQUE: Multidetector CT imaging of the chest was performed using the standard protocol during bolus administration of intravenous contrast. Multiplanar CT image reconstructions and  MIPs were obtained to evaluate the vascular anatomy. CONTRAST:  75mL OMNIPAQUE IOHEXOL 350 MG/ML SOLN COMPARISON:  May 10, 2020. FINDINGS: Cardiovascular: Satisfactory opacification of the pulmonary arteries to the segmental level. No evidence of pulmonary embolism. Normal heart size. No pericardial effusion. Mediastinum/Nodes: No adenopathy is noted. Thyroid gland is unremarkable. Endotracheal tube is in grossly good position. Nasogastric tube is seen passing through esophagus and into stomach. Lungs/Pleura: No pneumothorax or pleural effusion is noted. Bilateral lower lobe airspace opacities are noted most consistent with pneumonia. Upper Abdomen: No acute abnormality. Musculoskeletal: No chest wall abnormality. No acute or significant osseous findings. Review of the MIP images confirms the above findings.  IMPRESSION: 1. No definite evidence of pulmonary embolus. 2. Bilateral lower lobe airspace opacities are noted most consistent with pneumonia. 3. Endotracheal and nasogastric tubes are in grossly good position. Electronically Signed   By: Lupita Raider M.D.   On: 07/11/2020 18:25   DG Chest Portable 1 View  Result Date: 07-11-20 CLINICAL DATA:  Unresponsive.  Status post intubation. EXAM: PORTABLE CHEST 1 VIEW COMPARISON:  May 31, 2020 FINDINGS: An endotracheal tube is seen with its distal tip approximately 3.2 cm from the carina. Mild left infrahilar atelectasis and/or infiltrate is seen. A 1.4 cm x 0.9 cm well-defined focal opacity is seen overlying the right lung base. This is not seen on the prior exam. The cardiac silhouette is moderately enlarged. The visualized skeletal structures are unremarkable. IMPRESSION: 1. Mild left infrahilar atelectasis and/or infiltrate. 2. Focal opacity overlying the right lung base which Rede represent a nipple shadow. Repeat chest radiograph with nipple markers is recommended. Electronically Signed   By: Aram Candela M.D.   On: 07/11/20 17:48    Procedures Procedure Name: Intubation Date/Time: Jul 11, 2020 6:02 PM Performed by: Heide Scales, MD Pre-anesthesia Checklist: Patient identified, Emergency Drugs available, Suction available and Patient being monitored Oxygen Delivery Method: Ambu bag Preoxygenation: Pre-oxygenation with 100% oxygen Induction Type: Rapid sequence Ventilation: Mask ventilation without difficulty Laryngoscope Size: Glidescope Grade View: Grade I Tube size: 7.5 mm Number of attempts: 1 Secured at: 21 cm Tube secured with: ETT holder Dental Injury: Teeth and Oropharynx as per pre-operative assessment       (including critical care time)  CRITICAL CARE Performed by: Canary Brim Caitrin Pendergraph Total critical care time: 45 minutes Critical care time was exclusive of separately billable procedures and treating  other patients. Critical care was necessary to treat or prevent imminent or life-threatening deterioration. Critical care was time spent personally by me on the following activities: development of treatment plan with patient and/or surrogate as well as nursing, discussions with consultants, evaluation of patient's response to treatment, examination of patient, obtaining history from patient or surrogate, ordering and performing treatments and interventions, ordering and review of laboratory studies, ordering and review of radiographic studies, pulse oximetry and re-evaluation of patient's condition.   Medications Ordered in ED Medications  propofol (DIPRIVAN) 1000 MG/100ML infusion (0 mcg/kg/min  73 kg Intravenous Stopped July 11, 2020 1850)  iohexol (OMNIPAQUE) 350 MG/ML injection 75 mL (has no administration in time range)  morphine 4 MG/ML injection 4 mg (has no administration in time range)  morphine  in NS ( /mL) infusion - premix (has no administration in time range)  glycopyrrolate (ROBINUL) tablet 1 mg (has no administration in time range)    Or  glycopyrrolate (ROBINUL) injection 0.2 mg (has no administration in time range)    Or  glycopyrrolate (ROBINUL) injection 0.2 mg (has no administration in time  range)  albuterol (PROVENTIL) (2.5 MG/3ML) 0.083% nebulizer solution 2.5 mg (has no administration in time range)  LORazepam (ATIVAN) tablet 1 mg (has no administration in time range)    Or  LORazepam (ATIVAN) 2 MG/ML concentrated solution 1 mg (has no administration in time range)    Or  LORazepam (ATIVAN) injection 1 mg (has no administration in time range)  LORazepam (ATIVAN) injection 1 mg (has no administration in time range)  etomidate (AMIDATE) injection (20 mg Intravenous Given 07/27/2020 1711)  rocuronium (ZEMURON) injection (70 mg Intravenous Given 07/15/2020 1721)  propofol (DIPRIVAN) 1000 MG/100ML infusion (  New Bag/Given 07/21/2020 1728)  iohexol (OMNIPAQUE) 350 MG/ML  injection 75 mL (75 mLs Intravenous Contrast Given 07/09/2020 1755)  protamine injection 35 mg (35 mg Intravenous Given 07/23/2020 1842)    ED Course  I have reviewed the triage vital signs and the nursing notes.  Pertinent labs & imaging results that were available during my care of the patient were reviewed by me and considered in my medical decision making (see chart for details).    MDM Rules/Calculators/A&P                          Darrius Raczka is a 50 y.o. male with a past medical significant for Down syndrome, prior seizures, recent COVID-19 in October, 2021 who also had extensive pulmonary embolism on enoxaparin shots presents with unresponsiveness and respiratory failure.  Patient reportedly was sitting in a vehicle today and when family left and returned around 3:30 PM, he was unresponsive, had had some vomiting in his mouth, and was having shallow intermittent breathing.  EMS was called and fire responded.  They started bagging him which they did for around 1 hour prior to come to the emergency department.  Patient reportedly gagged a oropharyngeal airway but otherwise had no response to pain or other stimuli.  On arrival, airway does not appear to be intact.  His GCS is 3 and he is unresponsive to any painful stimulation.  His breath sounds are very coarse and I am concerned about continuing aspiration.  Patient was intubated with RSI on arrival for airway protection given the unresponsive state.  Patient was intubated without difficulty.  We will get work-up to look for other etiology of his symptoms including CT head given his blood thinner use to rule out possible intracranial hemorrhage, other labs to look for infection or significantly try to balance, and we will call ICU for admission.  ICU team will come admit.  Awaiting further work-up prior to admission.  The patient's sister was able to come to the bedside and provide further permission.  She reports that this morning, patient was  complaining of severe headache but he did not report any trauma.  He also had some nausea and vomiting which initially family attributed to eating a lot of chocolate last night after a parade.  Patient tried to take some ibuprofen today but vomited them up.  She reports that he went unresponsive and did not respond to a sternal rub this afternoon and then she brought him out to the car because of aggressive dog so that responders would not have to deal with the dogs.  Then fire department got there and started providing respiratory support for the patient.  5:58 PM I accompanied patient into the CT scanner and unfortunately has had CT shows a very large intracranial hemorrhage.  ICU team was called with this finding and they requested I  speak with neurosurgery to be involved with the patient's care.  Neurosurgery will come see the patient but they did not feel this was likely survivable.  ICU team will admit.  Final Clinical Impression(s) / ED Diagnoses Final diagnoses:  Nontraumatic intracerebral hemorrhage, unspecified cerebral location, unspecified laterality (HCC)  Unresponsiveness     Clinical Impression: 1. Nontraumatic intracerebral hemorrhage, unspecified cerebral location, unspecified laterality (HCC)   2. Unresponsiveness     Disposition: Admit  This note was prepared with assistance of Dragon voice recognition software. Occasional wrong-word or sound-a-like substitutions Skeels have occurred due to the inherent limitations of voice recognition software.     Pasha Gadison, Canary Brim, MD Aug 01, 2020 2218

## 2020-07-31 DEATH — deceased

## 2020-08-02 ENCOUNTER — Encounter: Payer: 59 | Admitting: Family Medicine

## 2020-09-08 ENCOUNTER — Ambulatory Visit: Payer: 59 | Admitting: Family Medicine

## 2020-11-02 ENCOUNTER — Ambulatory Visit: Payer: 59 | Admitting: Family Medicine

## 2021-04-10 IMAGING — DX DG FOOT COMPLETE 3+V*L*
3 series · 3 of 3 positions shown · non-contrast
Comparison: None.

CLINICAL DATA: Medial left foot pain

EXAM:
LEFT FOOT - COMPLETE 3+ VIEW

[foot ap]
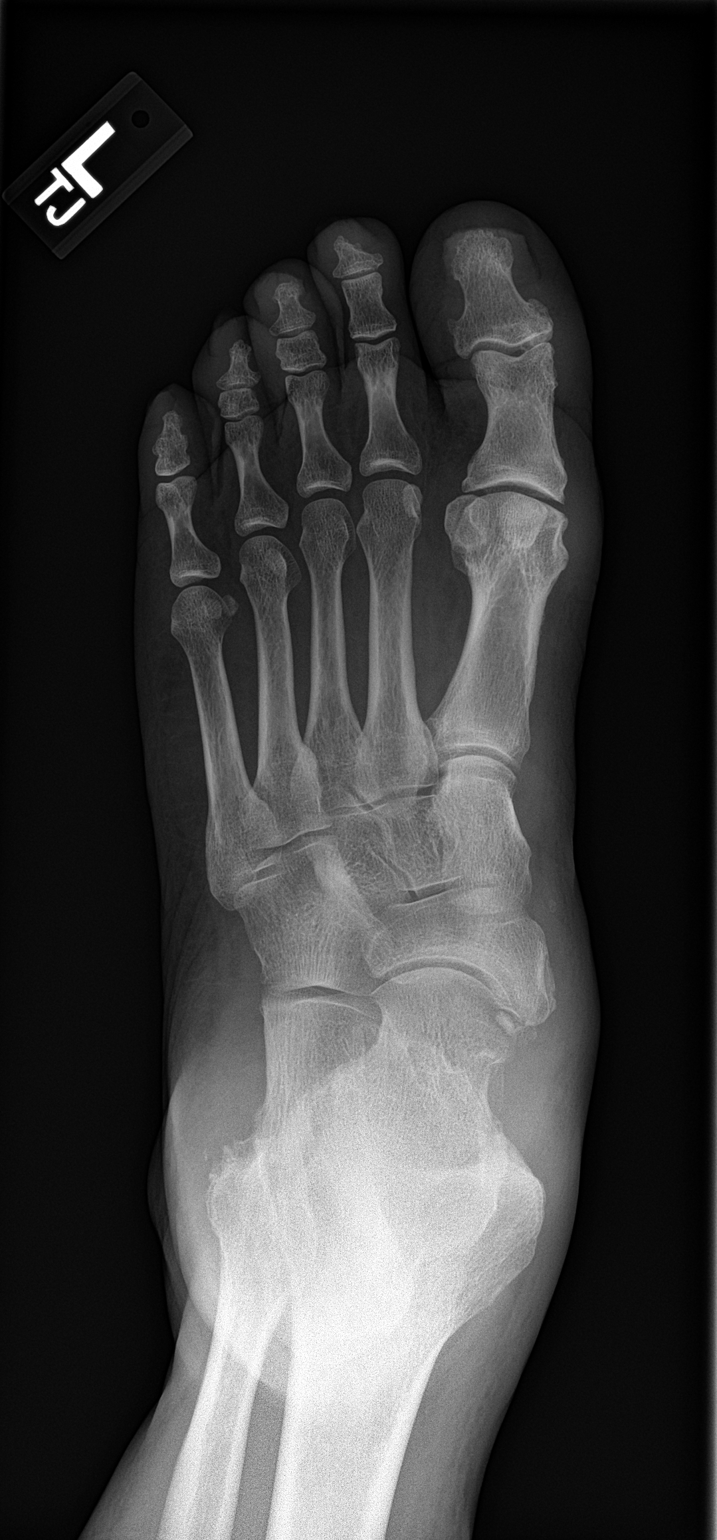

[foot obl]
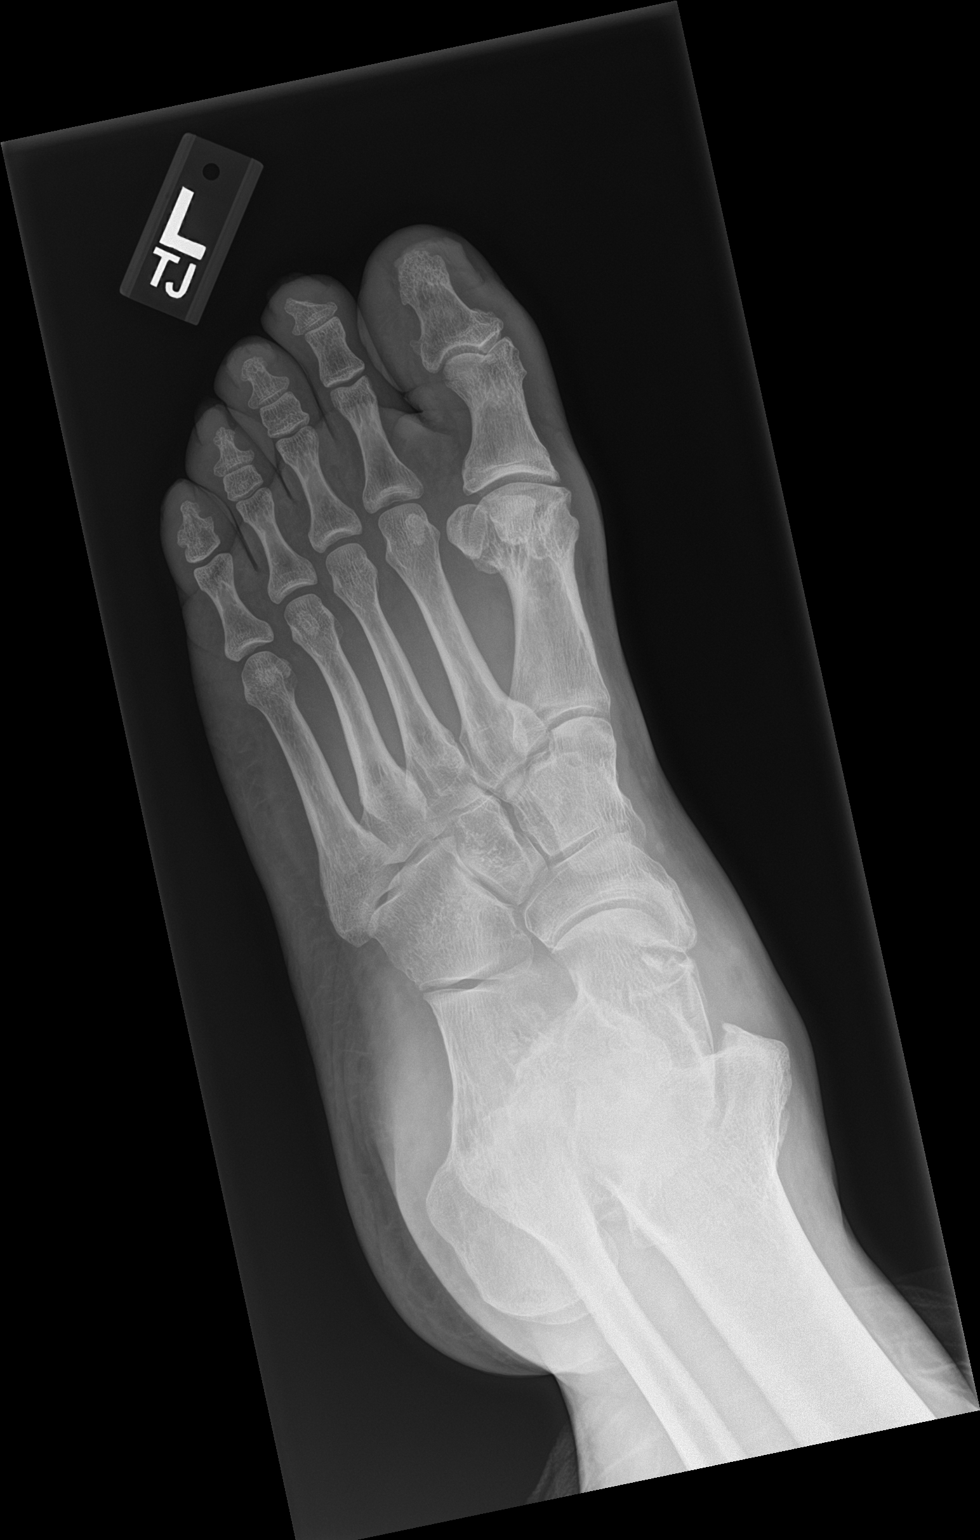

[foot lat]
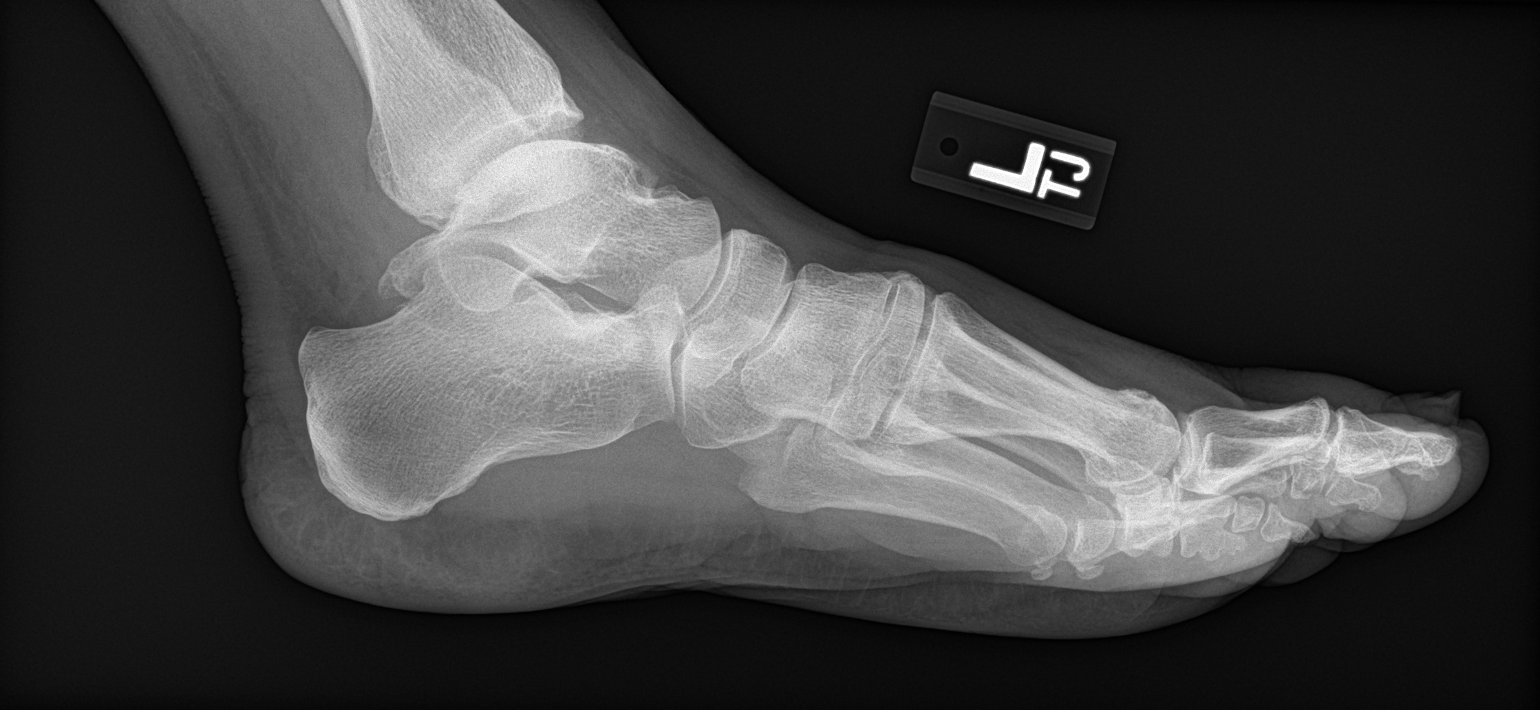

[3 of 3 positions shown; findings below may reference images not displayed]

FINDINGS: There is no evidence of fracture or dislocation. Mild degenerative
change in the first MTP. No erosion. Soft tissues are unremarkable.

Degenerative changes in the ankle.
IMPRESSION: Mild degenerative change first MTP.

Degenerative changes in the ankle

## 2022-02-09 IMAGING — DX DG CHEST 1V PORT
1 series · 1 of 1 positions shown · non-contrast
Comparison: Chest radiograph dated 07/27/2017.

CLINICAL DATA: 48-year-old male with lethargy.

EXAM:
PORTABLE CHEST 1 VIEW

[chest ap]
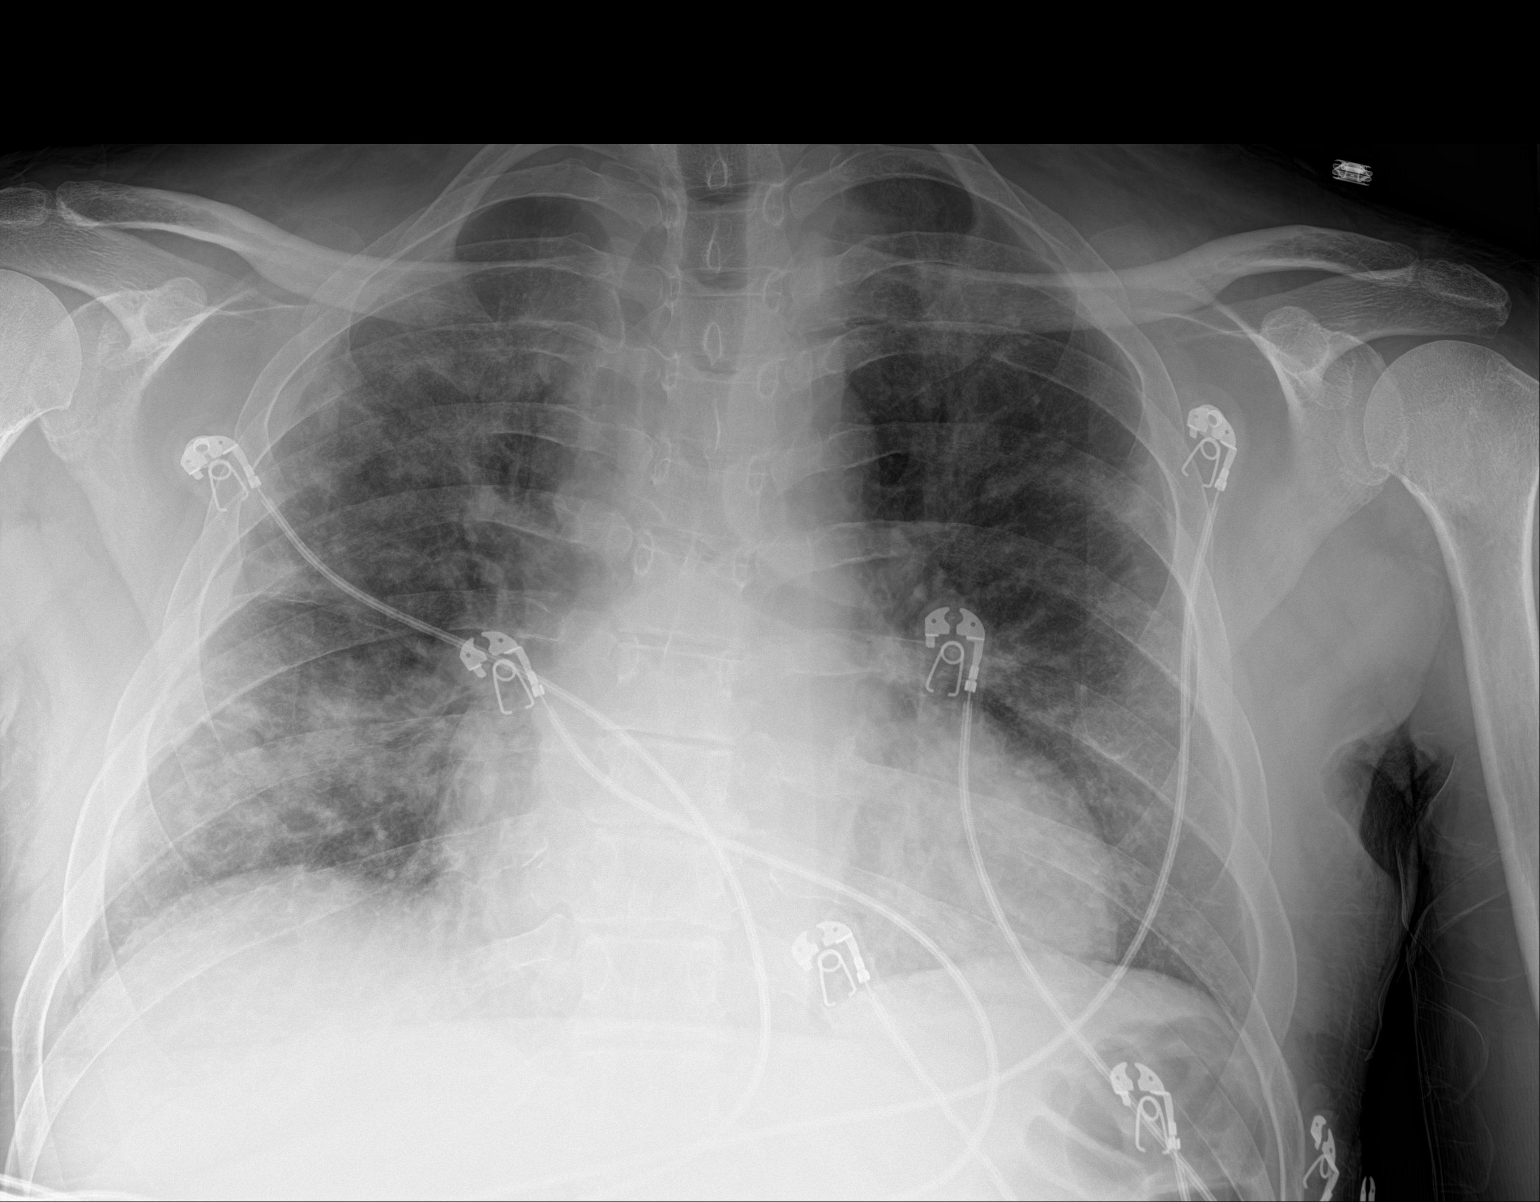

[1 of 1 positions shown; findings below may reference images not displayed]

FINDINGS: Bilateral confluent airspace opacities most consistent with
multifocal pneumonia, likely viral or atypical in etiology including
[id number]. Clinical correlation and follow-up recommended. No pleural
effusion or pneumothorax. Stable cardiac silhouette. No acute
osseous pathology.
IMPRESSION: Multifocal pneumonia.

## 2022-02-10 IMAGING — CT CT ANGIO CHEST
2 of 6 series · 18 of 36 positions shown · IV contrast (omnipaque)
Comparison: Chest CTA 10/20/2012.

CLINICAL DATA: 48-year-old male with history of shortness of
breath. Suspected pulmonary embolism.

EXAM:
CT ANGIOGRAPHY CHEST WITH CONTRAST
TECHNIQUE: Multidetector CT imaging of the chest was performed using the
standard protocol during bolus administration of intravenous
contrast. Multiplanar CT image reconstructions and MIPs were
obtained to evaluate the vascular anatomy.
CONTRAST:  75mL OMNIPAQUE IOHEXOL 350 MG/ML SOLN

[Series 7: pe thins · axial · 0.88mm/px · z∈[+1154,+1432]mm · 17 of 442 slices shown]
[im 23/442  lung]
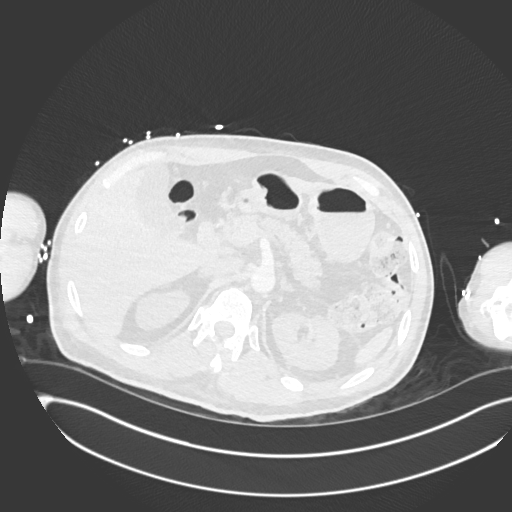
[im 45/442  mediastinal]
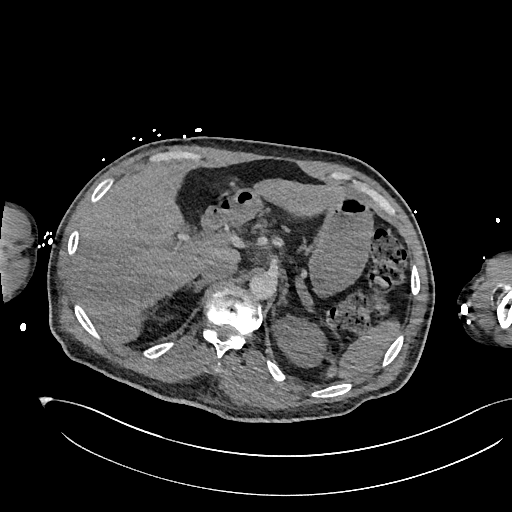
[im 67/442  lung]
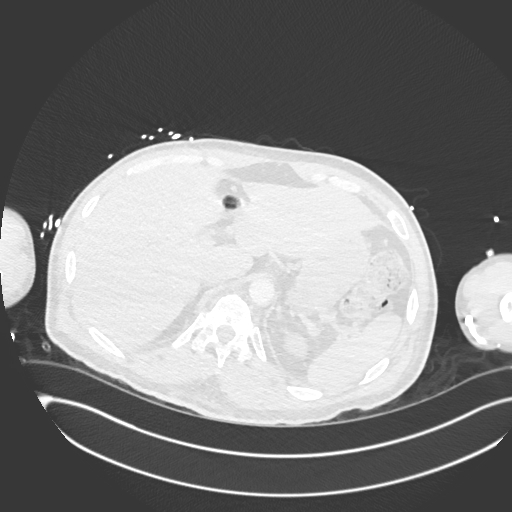
[im 89/442  mediastinal]
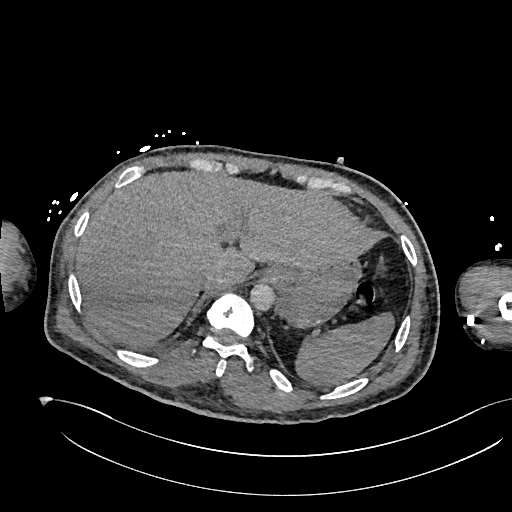
[im 133/442  lung]
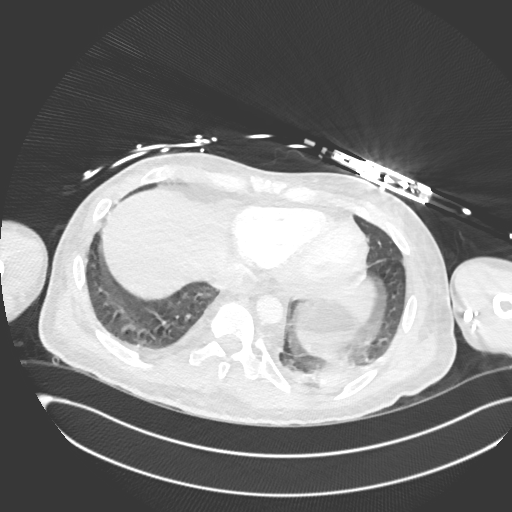
[im 155/442  mediastinal]
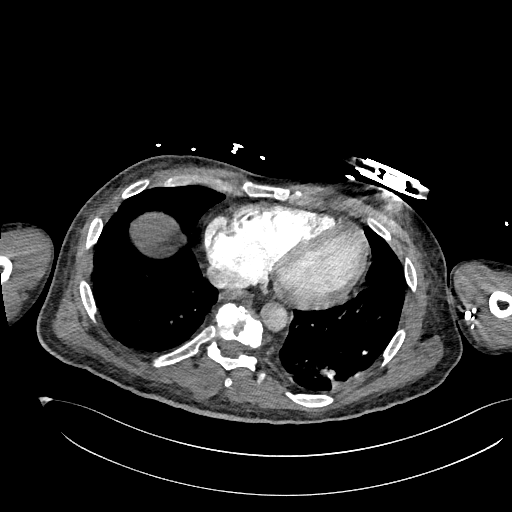
[im 177/442  lung]
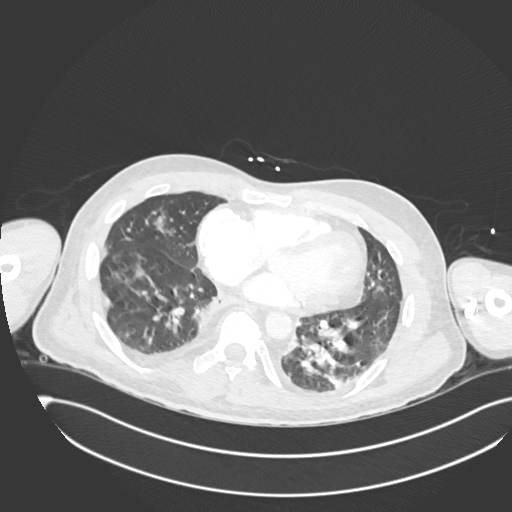
[im 199/442  mediastinal]
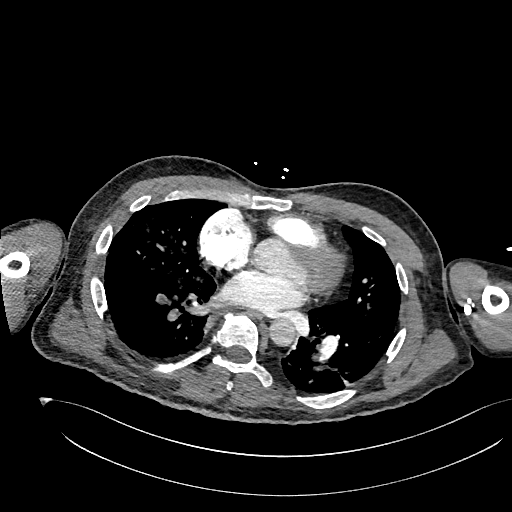
[im 221/442  lung]
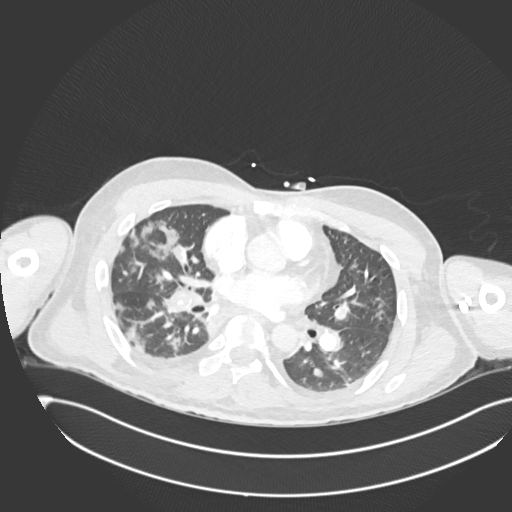
[im 243/442  mediastinal]
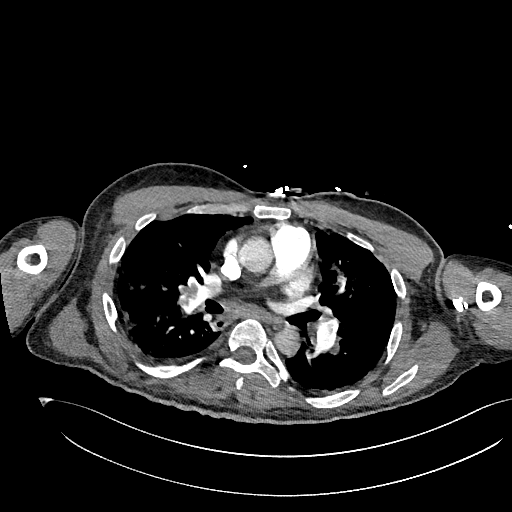
[im 265/442  lung]
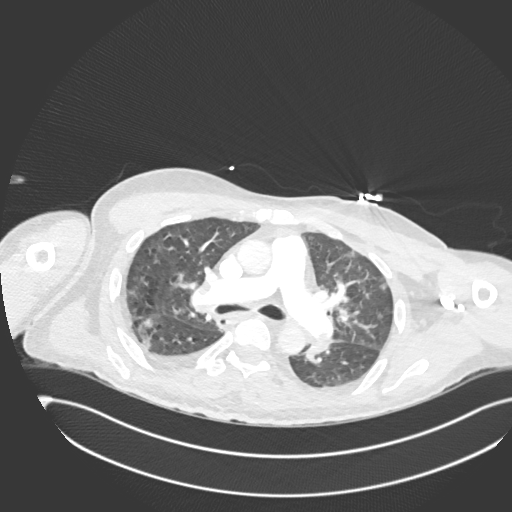
[im 287/442  mediastinal]
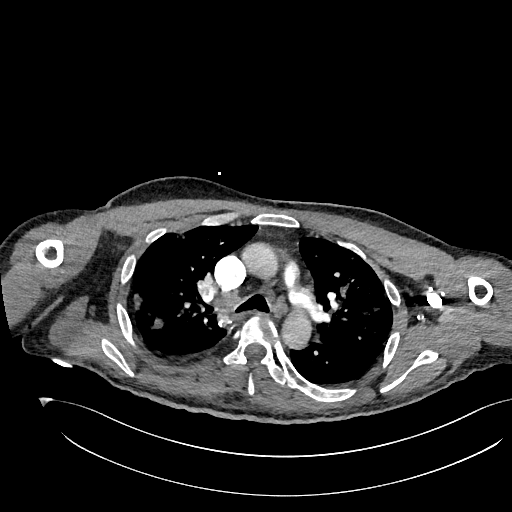
[im 309/442  lung]
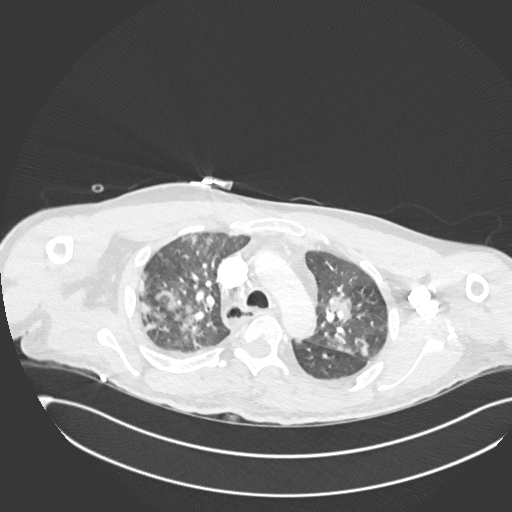
[im 353/442  mediastinal]
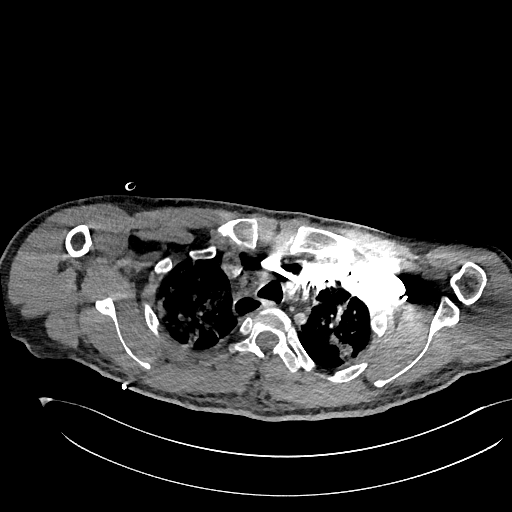
[im 375/442  lung]
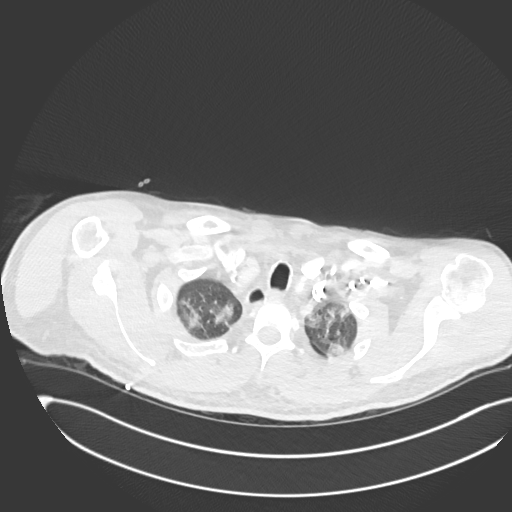
[im 397/442  mediastinal]
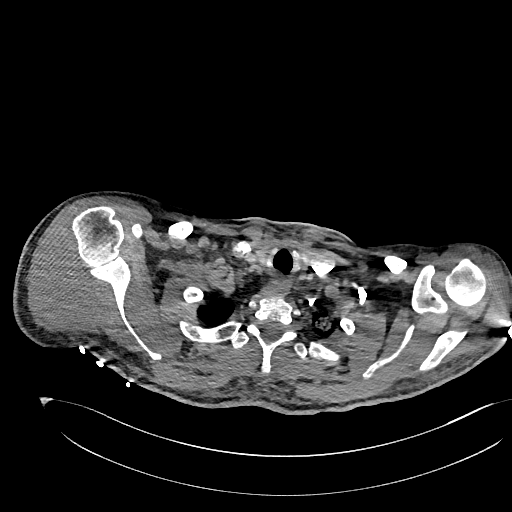
[im 419/442  lung]
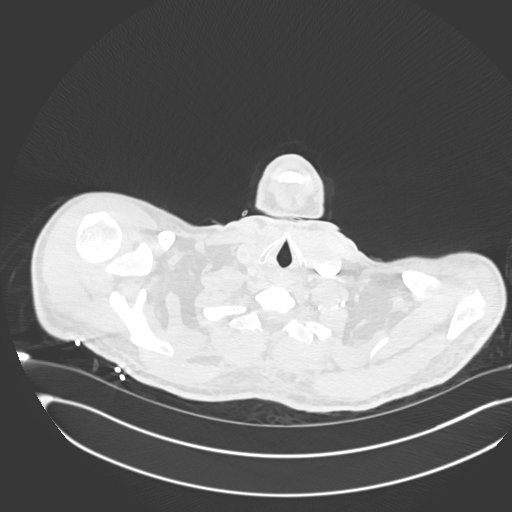

[Series 8: pe 2mm cor · coronal · 0.61mm/px · 1 of 151 slices shown]
[im 76/151  mediastinal]
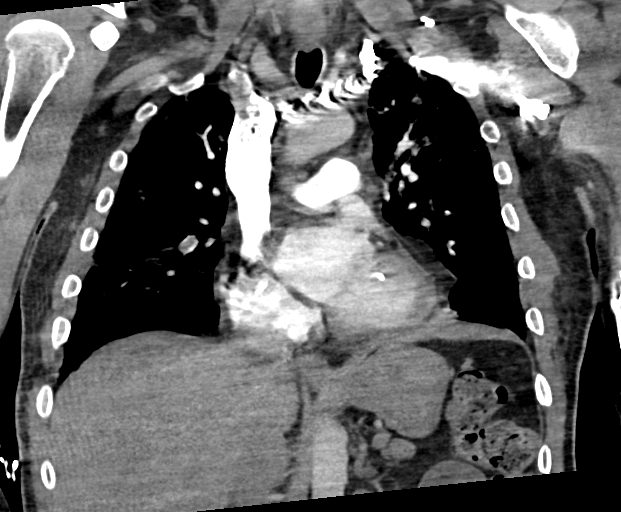

[18 of 36 positions shown; findings below may reference images not displayed]

FINDINGS: Cardiovascular: Multiple filling defects are noted within the
pulmonary arterial tree bilaterally, indicative of widespread
pulmonary emboli. This includes distal main, lobar, segmental and
subsegmental sized branches in the lungs bilaterally, most of which
are nonocclusive, but there are several occlusive filling defects as
well. Heart size is borderline enlarged. There is no significant
pericardial fluid, thickening or pericardial calcification. No
atherosclerotic calcifications in the thoracic aorta or the coronary
arteries. Mitral annular calcifications.

Mediastinum/Nodes: No pathologically enlarged mediastinal or hilar
lymph nodes. Patulous esophagus. No axillary lymphadenopathy.

Lungs/Pleura: Patchy multifocal areas of ground-glass attenuation
and interstitial prominence are noted throughout the lungs
bilaterally, most evident in a peribronchovascular distribution,
most severe throughout the mid to upper lungs. No definite pleural
effusions.

Upper Abdomen: Unremarkable.

Musculoskeletal: There are no aggressive appearing lytic or blastic
lesions noted in the visualized portions of the skeleton.

Review of the MIP images confirms the above findings.
IMPRESSION: 1. Extensive bilateral main, lobar, segmental and subsegmental sized
pulmonary emboli.
2. The appearance of the lungs is most suggestive of severe
multilobar bilateral pneumonia, presumably from reported [id number]
infection. However, the possibility that one or more of these areas
could represent alveolar hemorrhage from underlying pulmonary
infarction is not excluded.

Critical Value/emergent results were called by telephone at the time
of interpretation on 05/10/2020 at [DATE] to provider RTOYOTA
AUAD, who verbally acknowledged these results.
